# Patient Record
Sex: Female | Born: 1935 | Race: White | Hispanic: No | Smoking: Never smoker
Health system: Southern US, Community
[De-identification: ages and names within clinical notes are randomized; demographics above are authoritative.]

## PROBLEM LIST (undated history)

## (undated) DIAGNOSIS — F0280 Dementia in other diseases classified elsewhere without behavioral disturbance: Secondary | ICD-10-CM

## (undated) DIAGNOSIS — D472 Monoclonal gammopathy: Secondary | ICD-10-CM

## (undated) DIAGNOSIS — I4891 Unspecified atrial fibrillation: Secondary | ICD-10-CM

## (undated) DIAGNOSIS — F0390 Unspecified dementia without behavioral disturbance: Secondary | ICD-10-CM

## (undated) DIAGNOSIS — N390 Urinary tract infection, site not specified: Secondary | ICD-10-CM

## (undated) DIAGNOSIS — S0033XA Contusion of nose, initial encounter: Secondary | ICD-10-CM

## (undated) DIAGNOSIS — S4292XA Fracture of left shoulder girdle, part unspecified, initial encounter for closed fracture: Secondary | ICD-10-CM

## (undated) DIAGNOSIS — B9620 Unspecified Escherichia coli [E. coli] as the cause of diseases classified elsewhere: Secondary | ICD-10-CM

## (undated) DIAGNOSIS — E876 Hypokalemia: Secondary | ICD-10-CM

## (undated) DIAGNOSIS — K219 Gastro-esophageal reflux disease without esophagitis: Secondary | ICD-10-CM

## (undated) DIAGNOSIS — K559 Vascular disorder of intestine, unspecified: Secondary | ICD-10-CM

## (undated) DIAGNOSIS — S22080A Wedge compression fracture of T11-T12 vertebra, initial encounter for closed fracture: Secondary | ICD-10-CM

## (undated) DIAGNOSIS — G309 Alzheimer's disease, unspecified: Secondary | ICD-10-CM

## (undated) HISTORY — DX: Fracture of left shoulder girdle, part unspecified, initial encounter for closed fracture: S42.92XA

## (undated) HISTORY — DX: Monoclonal gammopathy: D47.2

## (undated) HISTORY — DX: Unspecified dementia, unspecified severity, without behavioral disturbance, psychotic disturbance, mood disturbance, and anxiety: F03.90

## (undated) HISTORY — PX: OTHER SURGICAL HISTORY: SHX169

## (undated) HISTORY — DX: Wedge compression fracture of T11-T12 vertebra, initial encounter for closed fracture: S22.080A

## (undated) HISTORY — DX: Contusion of nose, initial encounter: S00.33XA

---

## 2008-04-17 ENCOUNTER — Ambulatory Visit: Payer: Self-pay | Admitting: Diagnostic Radiology

## 2008-04-17 ENCOUNTER — Emergency Department (HOSPITAL_BASED_OUTPATIENT_CLINIC_OR_DEPARTMENT_OTHER): Admission: EM | Admit: 2008-04-17 | Discharge: 2008-04-17 | Payer: Self-pay | Admitting: Emergency Medicine

## 2009-11-30 ENCOUNTER — Emergency Department (HOSPITAL_BASED_OUTPATIENT_CLINIC_OR_DEPARTMENT_OTHER)
Admission: EM | Admit: 2009-11-30 | Discharge: 2009-11-30 | Payer: Self-pay | Source: Home / Self Care | Admitting: Emergency Medicine

## 2010-03-14 LAB — URINALYSIS, ROUTINE W REFLEX MICROSCOPIC
Glucose, UA: NEGATIVE mg/dL
Hgb urine dipstick: NEGATIVE
Specific Gravity, Urine: 1.02 (ref 1.005–1.030)

## 2010-03-14 LAB — BASIC METABOLIC PANEL
Chloride: 106 mEq/L (ref 96–112)
GFR calc non Af Amer: >60 mL/min (ref >60)
Glucose, Bld: 87 mg/dL (ref 70–99)
Potassium: 4 mEq/L (ref 3.5–5.1)
Sodium: 144 mEq/L (ref 135–145)

## 2010-03-14 LAB — DIFFERENTIAL
Basophils Relative: 1 % (ref 0–1)
Eosinophils Relative: 3 % (ref 0–5)
Lymphocytes Relative: 35 % (ref 12–46)
Monocytes Absolute: 0.4 10*3/uL (ref 0.1–1.0)
Monocytes Relative: 9 % (ref 3–12)
Neutro Abs: 2.7 10*3/uL (ref 1.7–7.7)

## 2010-03-14 LAB — CBC
HCT: 35.5 % — ABNORMAL LOW (ref 36.0–46.0)
Hemoglobin: 12.2 g/dL (ref 12.0–15.0)
MCHC: 34.2 g/dL (ref 30.0–36.0)
MCV: 96.8 fL (ref 78.0–100.0)

## 2010-04-12 LAB — DIFFERENTIAL
Basophils Absolute: 0.1 10*3/uL (ref 0.0–0.1)
Basophils Relative: 1 % (ref 0–1)
Eosinophils Absolute: 0.2 10*3/uL (ref 0.0–0.7)
Eosinophils Relative: 3 % (ref 0–5)
Lymphocytes Relative: 33 % (ref 12–46)
Lymphs Abs: 2.2 10*3/uL (ref 0.7–4.0)
Monocytes Absolute: 0.5 10*3/uL (ref 0.1–1.0)
Monocytes Relative: 8 % (ref 3–12)
Neutro Abs: 3.7 10*3/uL (ref 1.7–7.7)
Neutrophils Relative %: 55 % (ref 43–77)

## 2010-04-12 LAB — BASIC METABOLIC PANEL
Chloride: 106 mEq/L (ref 96–112)
Creatinine, Ser: 0.8 mg/dL (ref 0.4–1.2)
GFR calc Af Amer: >60 mL/min (ref >60)
Potassium: 3.7 mEq/L (ref 3.5–5.1)
Sodium: 144 mEq/L (ref 135–145)

## 2010-04-12 LAB — CBC
HCT: 37.1 % (ref 36.0–46.0)
Hemoglobin: 12.2 g/dL (ref 12.0–15.0)
MCHC: 32.8 g/dL (ref 30.0–36.0)
MCV: 90.7 fL (ref 78.0–100.0)
Platelets: 252 10*3/uL (ref 150–400)
RBC: 4.09 MIL/uL (ref 3.87–5.11)
RDW: 12.7 % (ref 11.5–15.5)
WBC: 6.7 10*3/uL (ref 4.0–10.5)

## 2010-04-12 LAB — URINALYSIS, ROUTINE W REFLEX MICROSCOPIC
Glucose, UA: NEGATIVE mg/dL
pH: 6 (ref 5.0–8.0)

## 2010-04-12 LAB — URINE CULTURE

## 2010-06-07 ENCOUNTER — Emergency Department (INDEPENDENT_AMBULATORY_CARE_PROVIDER_SITE_OTHER): Payer: Medicare Other

## 2010-06-07 ENCOUNTER — Emergency Department (HOSPITAL_BASED_OUTPATIENT_CLINIC_OR_DEPARTMENT_OTHER)
Admission: EM | Admit: 2010-06-07 | Discharge: 2010-06-07 | Disposition: A | Discharge status: Other Acute Inpatient Hospital | Payer: Medicare Other | Attending: Emergency Medicine | Admitting: Emergency Medicine

## 2010-06-07 DIAGNOSIS — I517 Cardiomegaly: Secondary | ICD-10-CM

## 2010-06-07 DIAGNOSIS — R404 Transient alteration of awareness: Secondary | ICD-10-CM

## 2010-06-07 DIAGNOSIS — F29 Unspecified psychosis not due to a substance or known physiological condition: Secondary | ICD-10-CM

## 2010-06-07 DIAGNOSIS — Z79899 Other long term (current) drug therapy: Secondary | ICD-10-CM | POA: Insufficient documentation

## 2010-06-07 DIAGNOSIS — Z7982 Long term (current) use of aspirin: Secondary | ICD-10-CM | POA: Insufficient documentation

## 2010-06-07 DIAGNOSIS — G309 Alzheimer's disease, unspecified: Secondary | ICD-10-CM | POA: Insufficient documentation

## 2010-06-07 DIAGNOSIS — N390 Urinary tract infection, site not specified: Secondary | ICD-10-CM | POA: Insufficient documentation

## 2010-06-07 DIAGNOSIS — K219 Gastro-esophageal reflux disease without esophagitis: Secondary | ICD-10-CM | POA: Insufficient documentation

## 2010-06-07 DIAGNOSIS — F0280 Dementia in other diseases classified elsewhere without behavioral disturbance: Secondary | ICD-10-CM | POA: Insufficient documentation

## 2010-06-07 DIAGNOSIS — M852 Hyperostosis of skull: Secondary | ICD-10-CM

## 2010-06-07 LAB — URINALYSIS, ROUTINE W REFLEX MICROSCOPIC
Ketones, ur: 15 mg/dL — AB
Nitrite: POSITIVE — AB
Protein, ur: NEGATIVE mg/dL
Urobilinogen, UA: 2 mg/dL — ABNORMAL HIGH (ref 0.0–1.0)
pH: 7.5 (ref 5.0–8.0)

## 2010-06-07 LAB — DIFFERENTIAL
Basophils Absolute: 0 10*3/uL (ref 0.0–0.1)
Basophils Relative: 0 % (ref 0–1)
Lymphs Abs: 1.2 10*3/uL (ref 0.7–4.0)
Monocytes Relative: 7 % (ref 3–12)

## 2010-06-07 LAB — CBC
HCT: 38.5 % (ref 36.0–46.0)
Hemoglobin: 13 g/dL (ref 12.0–15.0)
RBC: 4.09 MIL/uL (ref 3.87–5.11)
WBC: 9.1 10*3/uL (ref 4.0–10.5)

## 2010-06-07 LAB — BASIC METABOLIC PANEL
CO2: 27 mEq/L (ref 19–32)
Calcium: 9.8 mg/dL (ref 8.4–10.5)
Chloride: 101 mEq/L (ref 96–112)
GFR calc Af Amer: >60 mL/min (ref >60)
Potassium: 3.9 mEq/L (ref 3.5–5.1)
Sodium: 139 mEq/L (ref 135–145)

## 2010-06-07 LAB — URINE MICROSCOPIC-ADD ON

## 2010-06-10 LAB — URINE CULTURE
Colony Count: >=100000
Culture  Setup Time: 201206062129

## 2010-06-26 ENCOUNTER — Emergency Department (INDEPENDENT_AMBULATORY_CARE_PROVIDER_SITE_OTHER): Payer: Medicare Other

## 2010-06-26 ENCOUNTER — Emergency Department (HOSPITAL_BASED_OUTPATIENT_CLINIC_OR_DEPARTMENT_OTHER)
Admission: EM | Admit: 2010-06-26 | Discharge: 2010-06-26 | Disposition: A | Discharge status: Home / Self Care | Payer: Medicare Other | Attending: Emergency Medicine | Admitting: Emergency Medicine

## 2010-06-26 DIAGNOSIS — Z79899 Other long term (current) drug therapy: Secondary | ICD-10-CM | POA: Insufficient documentation

## 2010-06-26 DIAGNOSIS — W19XXXA Unspecified fall, initial encounter: Secondary | ICD-10-CM

## 2010-06-26 DIAGNOSIS — N390 Urinary tract infection, site not specified: Secondary | ICD-10-CM | POA: Insufficient documentation

## 2010-06-26 DIAGNOSIS — K219 Gastro-esophageal reflux disease without esophagitis: Secondary | ICD-10-CM | POA: Insufficient documentation

## 2010-06-26 DIAGNOSIS — Z0389 Encounter for observation for other suspected diseases and conditions ruled out: Secondary | ICD-10-CM | POA: Insufficient documentation

## 2010-06-26 DIAGNOSIS — Y9210 Unspecified residential institution as the place of occurrence of the external cause: Secondary | ICD-10-CM | POA: Insufficient documentation

## 2010-06-26 DIAGNOSIS — G319 Degenerative disease of nervous system, unspecified: Secondary | ICD-10-CM | POA: Insufficient documentation

## 2010-06-26 DIAGNOSIS — W050XXA Fall from non-moving wheelchair, initial encounter: Secondary | ICD-10-CM | POA: Insufficient documentation

## 2010-06-26 DIAGNOSIS — G309 Alzheimer's disease, unspecified: Secondary | ICD-10-CM | POA: Insufficient documentation

## 2010-06-26 DIAGNOSIS — F0280 Dementia in other diseases classified elsewhere without behavioral disturbance: Secondary | ICD-10-CM | POA: Insufficient documentation

## 2010-06-26 DIAGNOSIS — I6789 Other cerebrovascular disease: Secondary | ICD-10-CM

## 2010-06-26 LAB — BASIC METABOLIC PANEL
GFR calc non Af Amer: >60 mL/min (ref >60)
Glucose, Bld: 93 mg/dL (ref 70–99)
Potassium: 3.9 mEq/L (ref 3.5–5.1)
Sodium: 139 mEq/L (ref 135–145)

## 2010-06-26 LAB — DIFFERENTIAL
Basophils Relative: 0 % (ref 0–1)
Eosinophils Relative: 1 % (ref 0–5)
Monocytes Absolute: 0.6 10*3/uL (ref 0.1–1.0)
Monocytes Relative: 9 % (ref 3–12)
Neutro Abs: 4.6 10*3/uL (ref 1.7–7.7)

## 2010-06-26 LAB — CBC
HCT: 35.4 % — ABNORMAL LOW (ref 36.0–46.0)
Hemoglobin: 11.8 g/dL — ABNORMAL LOW (ref 12.0–15.0)
MCH: 31.5 pg (ref 26.0–34.0)
MCHC: 33.3 g/dL (ref 30.0–36.0)

## 2010-06-26 LAB — URINALYSIS, ROUTINE W REFLEX MICROSCOPIC
Glucose, UA: NEGATIVE mg/dL
Ketones, ur: NEGATIVE mg/dL
Protein, ur: NEGATIVE mg/dL

## 2010-06-26 LAB — URINE MICROSCOPIC-ADD ON

## 2010-06-28 LAB — URINE CULTURE: Culture  Setup Time: 201206260141

## 2010-11-09 ENCOUNTER — Encounter: Payer: Self-pay | Admitting: Emergency Medicine

## 2010-11-09 ENCOUNTER — Other Ambulatory Visit: Payer: Self-pay

## 2010-11-09 ENCOUNTER — Inpatient Hospital Stay (HOSPITAL_BASED_OUTPATIENT_CLINIC_OR_DEPARTMENT_OTHER)
Admission: EM | Admit: 2010-11-09 | Discharge: 2010-11-14 | DRG: 394 | Disposition: A | Discharge status: Nursing Home (Includes ICF) | Payer: Medicare Other | Attending: Internal Medicine | Admitting: Internal Medicine

## 2010-11-09 DIAGNOSIS — E876 Hypokalemia: Secondary | ICD-10-CM | POA: Diagnosis present

## 2010-11-09 DIAGNOSIS — K922 Gastrointestinal hemorrhage, unspecified: Secondary | ICD-10-CM | POA: Diagnosis present

## 2010-11-09 DIAGNOSIS — Z781 Physical restraint status: Secondary | ICD-10-CM | POA: Diagnosis not present

## 2010-11-09 DIAGNOSIS — F0390 Unspecified dementia without behavioral disturbance: Secondary | ICD-10-CM | POA: Diagnosis present

## 2010-11-09 DIAGNOSIS — K559 Vascular disorder of intestine, unspecified: Principal | ICD-10-CM | POA: Diagnosis present

## 2010-11-09 DIAGNOSIS — K219 Gastro-esophageal reflux disease without esophagitis: Secondary | ICD-10-CM | POA: Insufficient documentation

## 2010-11-09 DIAGNOSIS — A498 Other bacterial infections of unspecified site: Secondary | ICD-10-CM | POA: Diagnosis present

## 2010-11-09 DIAGNOSIS — B9620 Unspecified Escherichia coli [E. coli] as the cause of diseases classified elsewhere: Secondary | ICD-10-CM | POA: Diagnosis present

## 2010-11-09 DIAGNOSIS — I4891 Unspecified atrial fibrillation: Secondary | ICD-10-CM | POA: Diagnosis present

## 2010-11-09 DIAGNOSIS — N390 Urinary tract infection, site not specified: Secondary | ICD-10-CM | POA: Diagnosis present

## 2010-11-09 HISTORY — DX: Hypokalemia: E87.6

## 2010-11-09 HISTORY — DX: Unspecified atrial fibrillation: I48.91

## 2010-11-09 HISTORY — DX: Dementia in other diseases classified elsewhere, unspecified severity, without behavioral disturbance, psychotic disturbance, mood disturbance, and anxiety: F02.80

## 2010-11-09 HISTORY — DX: Urinary tract infection, site not specified: B96.20

## 2010-11-09 HISTORY — DX: Unspecified Escherichia coli (E. coli) as the cause of diseases classified elsewhere: N39.0

## 2010-11-09 HISTORY — DX: Alzheimer's disease, unspecified: G30.9

## 2010-11-09 HISTORY — DX: Gastro-esophageal reflux disease without esophagitis: K21.9

## 2010-11-09 LAB — SURGICAL PCR SCREEN
MRSA, PCR: NEGATIVE
Staphylococcus aureus: NEGATIVE

## 2010-11-09 LAB — DIFFERENTIAL
Basophils Absolute: 0 10*3/uL (ref 0.0–0.1)
Basophils Relative: 0 % (ref 0–1)
Eosinophils Absolute: 0 10*3/uL (ref 0.0–0.7)
Eosinophils Relative: 0 % (ref 0–5)
Monocytes Absolute: 0.5 10*3/uL (ref 0.1–1.0)
Neutro Abs: 9.7 10*3/uL — ABNORMAL HIGH (ref 1.7–7.7)

## 2010-11-09 LAB — CBC
HCT: 41.9 % (ref 36.0–46.0)
MCH: 31.2 pg (ref 26.0–34.0)
MCHC: 33.2 g/dL (ref 30.0–36.0)
MCV: 93.9 fL (ref 78.0–100.0)
RDW: 12.6 % (ref 11.5–15.5)

## 2010-11-09 LAB — COMPREHENSIVE METABOLIC PANEL
AST: 33 U/L (ref 0–37)
Albumin: 3.9 g/dL (ref 3.5–5.2)
Calcium: 10.3 mg/dL (ref 8.4–10.5)
Creatinine, Ser: 0.7 mg/dL (ref 0.50–1.10)
Total Protein: 8.1 g/dL (ref 6.0–8.3)

## 2010-11-09 LAB — LACTIC ACID, PLASMA: Lactic Acid, Venous: 1.3 mmol/L (ref 0.5–2.2)

## 2010-11-09 MED ORDER — SODIUM CHLORIDE 0.9 % IV BOLUS (SEPSIS)
1000.0000 mL | Freq: Once | INTRAVENOUS | Status: AC
  Administered 2010-11-09: 1000 mL via INTRAVENOUS

## 2010-11-09 MED ORDER — DIPHENHYDRAMINE HCL 50 MG/ML IJ SOLN: 25.0000 mg | Freq: Once | INTRAMUSCULAR | Status: DC

## 2010-11-09 MED ORDER — POTASSIUM CHLORIDE IN NACL 20-0.9 MEQ/L-% IV SOLN
INTRAVENOUS | Status: DC
  Administered 2010-11-09 – 2010-11-10 (×2): via INTRAVENOUS
  Administered 2010-11-11: 75 mL/h via INTRAVENOUS
  Filled 2010-11-09 (×3): qty 1000

## 2010-11-09 MED ORDER — PANTOPRAZOLE SODIUM 40 MG IV SOLR
40.0000 mg | Freq: Two times a day (BID) | INTRAVENOUS | Status: DC
  Administered 2010-11-10 – 2010-11-11 (×2): 40 mg via INTRAVENOUS
  Filled 2010-11-09 (×4): qty 40

## 2010-11-09 MED ORDER — DILTIAZEM LOAD VIA INFUSION
10.0000 mg | Freq: Once | INTRAVENOUS | Status: AC
  Administered 2010-11-09: 10 mg via INTRAVENOUS
  Filled 2010-11-09: qty 10

## 2010-11-09 MED ORDER — SODIUM CHLORIDE 0.9 % IV SOLN
8.0000 mg/h | INTRAVENOUS | Status: DC
  Administered 2010-11-09: 8 mg/h via INTRAVENOUS
  Filled 2010-11-09 (×3): qty 80

## 2010-11-09 MED ORDER — DILTIAZEM HCL 100 MG IV SOLR
5.0000 mg/h | INTRAVENOUS | Status: DC
  Administered 2010-11-09: 5 mg/h via INTRAVENOUS
  Administered 2010-11-10 – 2010-11-11 (×5): 10 mg/h via INTRAVENOUS
  Filled 2010-11-09 (×2): qty 100

## 2010-11-09 MED ORDER — ACETAMINOPHEN 325 MG PO TABS: 650.0000 mg | ORAL_TABLET | Freq: Once | ORAL | Status: DC

## 2010-11-09 MED ORDER — PANTOPRAZOLE SODIUM 40 MG IV SOLR
80.0000 mg | Freq: Once | INTRAVENOUS | Status: AC
  Administered 2010-11-09: 80 mg via INTRAVENOUS
  Filled 2010-11-09: qty 80

## 2010-11-09 NOTE — ED Notes (Signed)
[facility] here for pt transport to [facility]. IV site unremarkable, IV gtt infusing per MD orders. Pt remains A-fib on monitor, resps even and unlabored, NAD noted. DNR form given to [facility] to transport with pt.

## 2010-11-09 NOTE — ED Notes (Signed)
Pt report given to Donnamae Jude, Charity fundraiser at [employer]

## 2010-11-09 NOTE — ED Notes (Signed)
Pt cleaned of moderate amount of soft stool with blood noted, peri-care given and clean brief applied. Pt tolerated well. IV fluids infusing without diff, IV site unremarkable. Pt remains A-fib on monitor, SR up x2.

## 2010-11-09 NOTE — ED Provider Notes (Signed)
History     CSN: [id number] Arrival date & time: 11/09/2010  5:07 PM   First MD Initiated Contact with Patient 11/09/10 1715      Chief Complaint  Patient presents with  . Rectal Bleeding    (Consider location/radiation/quality/duration/timing/severity/associated sxs/prior treatment) HPI The patient presents from her nursing home with concerns of bloody stool. The patient has dementia, and only acknowledges blood in her stool. She is incapable of providing duration of symptoms, or any alleviating or exacerbating circumstances. She notes that she is in no pain, and has no other complaints. Per report the patient has had blood in her stool today, no reports of syncope, or other notable changes in behavior Past Medical History  Diagnosis Date  . Alzheimer disease   . Atrial fibrillation   . Gastro - esophageal reflux disease     No past surgical history on file.  No family history on file.  History  Substance Use Topics  . Smoking status: Not on file  . Smokeless tobacco: Not on file  . Alcohol Use: No    OB History    Grav Para Term Preterm Abortions TAB SAB Ect Mult Living                  Review of Systems  Unable to perform ROS: Dementia    Allergies  Review of patient's allergies indicates not on file.  Home Medications  No current outpatient prescriptions on file.  BP 136/87  Pulse 129  Temp(Src) 98.8 F (37.1 C) (Oral)  Resp 20  SpO2 97%  Physical Exam  Constitutional: No distress.       Elderly, appropriate for age  HENT:  Head: Normocephalic and atraumatic.  Eyes: EOM are normal. Pupils are equal, round, and reactive to light.  Neck: Neck supple.  Cardiovascular: Tachycardia present.   Pulmonary/Chest: Breath sounds normal. No accessory muscle usage. No respiratory distress.  Abdominal: Soft. Normal appearance. There is generalized tenderness.  Genitourinary:       Melena about the patient's rectum  Musculoskeletal: She exhibits no edema.    Neurological: No cranial nerve deficit.  Skin: Skin is warm and dry. She is not diaphoretic.  Psychiatric: Her affect is blunt. She is slowed and withdrawn. Cognition and memory are impaired.    ED Course  Procedures (including critical care time)   Labs Reviewed  CBC  DIFFERENTIAL  COMPREHENSIVE METABOLIC PANEL  LACTIC ACID, PLASMA   No results found.   No diagnosis found.    MDM  This elderly female presents from her nursing home with concerns of rectal bleeding. On exam the patient is in no distress though with her history of dementia she is incapable of providing useful information. The patient is tachycardic with atrial flutter, she has a history of dysrhythmia. The patient has gross melena. Hemoglobin is greater than 13 which is somewhat reassuring, but with her GI bleed, the patient will be admitted for further evaluation. The patient's case was discussed with Dr. Elnoria Howard, Sandria Manly, as well as a hospitalist, Dr. Tarri Abernethy, MD 11/09/10 [phone]

## 2010-11-09 NOTE — H&P (Signed)
PCP:   No primary provider on file.   Chief Complaint:  GI bleed  HPI: This is a demented female resident of a nursing home, who was transferred here from [facility] with GI bleed. At the NH she was noted to have GI bleed and was sent to the ER. She had frank melena in the [place] ER, here she had a stool, no obvious blood. Dr Elnoria Howard was contacted by the physician at [facility] and agreed to see patient. Patient's hemoglobin is 13. She has A fib, now with RVR. She is not on anti-coagulation. She is on a baby ASA. I am unable to obtain any history from the patient, she has severe dementia.  Review of Systems:  Unable to obtain d/t patients demetia  Past Medical History: Past Medical History  Diagnosis Date  . Alzheimer disease   . Atrial fibrillation   . Gastro - esophageal reflux disease    Past Surgical History  Procedure Date  . Unknown     Medications: Prior to Admission medications   Medication Sig Start Date End Date Taking? Authorizing Provider  aspirin 81 MG tablet Take 81 mg by mouth daily.     Yes Historical Provider, MD  atenolol (TENORMIN) 25 MG tablet Take 25 mg by mouth daily.     Yes Historical Provider, MD  Calcium Carbonate-Vitamin D (CALCIUM 600+D) 600-400 MG-UNIT per tablet Take 1 tablet by mouth daily.     Yes Historical Provider, MD  cholecalciferol (VITAMIN D) 1000 UNITS tablet Take 1,000 Units by mouth daily.     Yes Historical Provider, MD  diltiazem (CARDIZEM) 120 MG tablet Take 120 mg by mouth daily.     Yes Historical Provider, MD  feeding supplement (ENSURE IMMUNE HEALTH) LIQD Take 237 mLs by mouth daily.     Yes Historical Provider, MD  ferrous sulfate 220 (44 FE) MG/5ML solution Take 330 mg by mouth daily. At noon     Yes Historical Provider, MD  memantine (NAMENDA) 5 MG tablet Take 5 mg by mouth 2 (two) times daily.     Yes Historical Provider, MD  Nutritional Supplements (UTYMAX) PACK Take 1 packet by mouth daily.     Yes Historical Provider, MD    omeprazole (PRILOSEC) 20 MG capsule Take 20 mg by mouth daily. Do not crush or chew    Yes Historical Provider, MD  Skin Protectants, Misc. (BAZA PROTECT EX) Apply 1 application topically 2 (two) times daily.     Yes Historical Provider, MD  acetaminophen (TYLENOL) 325 MG tablet Take 650 mg by mouth every 6 (six) hours as needed. For pain     Historical Provider, MD    Allergies:  No Known Allergies  Social History:  does not have a smoking history on file. She does not have any smokeless tobacco history on file. She reports that she does not drink alcohol. Her drug history not on file. resides in a NH  Family History: Unable to obtain d/t [patients dementia  Physical Exam: Filed Vitals:   11/09/10 1916 11/09/10 2031 11/09/10 2200 11/09/10 2323  BP: 167/82 153/83 146/78 151/88  Pulse:   130 130  Temp:  99.3 F (37.4 C)    TempSrc:      Resp: 16 20 16 16   SpO2: 99%  100% 100%    General:  Demented female Eyes: PERRLA, pink conjunctiva, no scleral icterus ENT: Moist oral mucosa, neck supple, no thyromegaly Lungs: clear to ascultation, no wheeze, no crackles, no use of  accessory muscles Cardiovascular: regular rate and rhythm, no regurgitation, no gallops, no murmurs. No carotid bruits, no JVD Abdomen: soft, positive BS, non-tender, non-distended, no organomegaly, not an acute abdomen GU: not examined Neuro: unable to assess d/t dementia Musculoskeletal: strength unable to assess d/t dementia, no clubbing, cyanosis or edema Skin: no rash, no subcutaneous crepitation, no decubitus Psych: demented female   Labs on Admission:   [facility] 11/09/10 1731  NA 140  K 4.3  CL 101  CO2 26  GLUCOSE 138*  BUN 21  CREATININE 0.70  CALCIUM 10.3  MG --  PHOS --    Basename 11/09/10 1731  AST 33  ALT 22  ALKPHOS 89  BILITOT 0.7  PROT 8.1  ALBUMIN 3.9   No results found for this basename: LIPASE:2,AMYLASE:2 in the last 72 hours  Basename 11/09/10 1731  WBC 10.5   NEUTROABS 9.7*  HGB 13.9  HCT 41.9  MCV 93.9  PLT 218   No results found for this basename: CKTOTAL:3,CKMB:3,CKMBINDEX:3,TROPONINI:3 in the last 72 hours No results found for this basename: TSH,T4TOTAL,FREET3,T3FREE,THYROIDAB in the last 72 hours No results found for this basename: VITAMINB12:2,FOLATE:2,FERRITIN:2,TIBC:2,IRON:2,RETICCTPCT:2 in the last 72 hours  Radiological Exams on Admission: No results found.  Assessment/Plan Present on Admission:  .GI bleed -admit -serial h/h -transfusion orders for hemoglobin <8 -guiac stools -protonix IV BiD -NPO, IVF hydration -d/c ASA -call Dr Elnoria Howard in AM, he is aware of patient Atrial fibrillation with RVR -cardizem gtt Dementia -stable  Code status: DNR SCD's/GI prophylaxis Dr Derry Lory, Hyun Marsalis 11/09/2010, 11:35 PM

## 2010-11-09 NOTE — ED Notes (Signed)
Pt report given to JC, RN with [employer]

## 2010-11-09 NOTE — ED Notes (Signed)
Staff at nursing home reports seeing sm amt of blood in stool earlier today

## 2010-11-10 ENCOUNTER — Other Ambulatory Visit: Payer: Self-pay | Admitting: Gastroenterology

## 2010-11-10 ENCOUNTER — Encounter (HOSPITAL_COMMUNITY)
Admission: EM | Disposition: A | Discharge status: Nursing Home (Includes ICF) | Payer: Self-pay | Source: Home / Self Care | Attending: Internal Medicine

## 2010-11-10 ENCOUNTER — Encounter (HOSPITAL_COMMUNITY): Payer: Self-pay | Admitting: Gastroenterology

## 2010-11-10 ENCOUNTER — Other Ambulatory Visit: Payer: Self-pay | Admitting: Internal Medicine

## 2010-11-10 DIAGNOSIS — K922 Gastrointestinal hemorrhage, unspecified: Secondary | ICD-10-CM

## 2010-11-10 HISTORY — PX: FLEXIBLE SIGMOIDOSCOPY: SHX5431

## 2010-11-10 HISTORY — PX: ESOPHAGOGASTRODUODENOSCOPY: SHX5428

## 2010-11-10 LAB — TSH: TSH: 0.913 u[IU]/mL (ref 0.350–4.500)

## 2010-11-10 LAB — URINE MICROSCOPIC-ADD ON

## 2010-11-10 LAB — URINALYSIS, ROUTINE W REFLEX MICROSCOPIC
Leukocytes, UA: NEGATIVE
Nitrite: POSITIVE — AB
Protein, ur: NEGATIVE mg/dL
Urobilinogen, UA: 1 mg/dL (ref 0.0–1.0)
pH: 7 (ref 5.0–8.0)

## 2010-11-10 LAB — PREPARE RBC (CROSSMATCH)

## 2010-11-10 LAB — BASIC METABOLIC PANEL
BUN: 19 mg/dL (ref 6–23)
Calcium: 8.7 mg/dL (ref 8.4–10.5)
Creatinine, Ser: 0.74 mg/dL (ref 0.50–1.10)
GFR calc Af Amer: 87 mL/min — ABNORMAL LOW (ref >90)
GFR calc non Af Amer: 75 mL/min — ABNORMAL LOW (ref >90)

## 2010-11-10 LAB — OCCULT BLOOD X 1 CARD TO LAB, STOOL: Fecal Occult Bld: POSITIVE

## 2010-11-10 LAB — HEMOGLOBIN AND HEMATOCRIT, BLOOD
HCT: 36.2 % (ref 36.0–46.0)
Hemoglobin: 12 g/dL (ref 12.0–15.0)

## 2010-11-10 LAB — PROTIME-INR: INR: 1.03 (ref 0.00–1.49)

## 2010-11-10 SURGERY — EGD (ESOPHAGOGASTRODUODENOSCOPY)
Anesthesia: Moderate Sedation | Site: Rectum

## 2010-11-10 SURGERY — EGD (ESOPHAGOGASTRODUODENOSCOPY)
Anesthesia: Moderate Sedation

## 2010-11-10 MED ORDER — METOPROLOL TARTRATE 1 MG/ML IV SOLN
INTRAVENOUS | Status: AC
  Filled 2010-11-10: qty 5

## 2010-11-10 MED ORDER — METOPROLOL TARTRATE 1 MG/ML IV SOLN
INTRAVENOUS | Status: AC
  Administered 2010-11-10: 5 mg
  Filled 2010-11-10: qty 5

## 2010-11-10 MED ORDER — MIDAZOLAM HCL 10 MG/2ML IJ SOLN
INTRAMUSCULAR | Status: AC
  Filled 2010-11-10: qty 2

## 2010-11-10 MED ORDER — SODIUM CHLORIDE 0.9 % IV SOLN
Freq: Once | INTRAVENOUS | Status: AC
  Administered 2010-11-10: 10:00:00 via INTRAVENOUS

## 2010-11-10 MED ORDER — FENTANYL NICU IV SYRINGE 50 MCG/ML
INJECTION | INTRAMUSCULAR | Status: DC | PRN
  Administered 2010-11-10: 20 ug via INTRAVENOUS

## 2010-11-10 MED ORDER — FENTANYL CITRATE 0.05 MG/ML IJ SOLN
INTRAMUSCULAR | Status: AC
  Filled 2010-11-10: qty 2

## 2010-11-10 MED ORDER — SODIUM CHLORIDE 0.9 % IV BOLUS (SEPSIS): 1000.0000 mL | Freq: Once | INTRAVENOUS | Status: DC

## 2010-11-10 MED ORDER — POTASSIUM CHLORIDE IN NACL 20-0.9 MEQ/L-% IV SOLN
INTRAVENOUS | Status: AC
  Filled 2010-11-10: qty 1000

## 2010-11-10 MED ORDER — METOPROLOL TARTRATE 1 MG/ML IV SOLN
5.0000 mg | Freq: Four times a day (QID) | INTRAVENOUS | Status: DC
  Administered 2010-11-10 – 2010-11-11 (×3): 5 mg via INTRAVENOUS
  Filled 2010-11-10 (×5): qty 5

## 2010-11-10 MED ORDER — MIDAZOLAM HCL 10 MG/2ML IJ SOLN
INTRAMUSCULAR | Status: DC | PRN
  Administered 2010-11-10: 1 mg via INTRAVENOUS

## 2010-11-10 NOTE — Progress Notes (Signed)
Subjective: Demented, confused, does not appear to be in acute distress at this time. Needing restraints this am, as attempting to climb out of bed.  Objective: Vital signs in last 24 hours: Temp:  [98.6 F (37 C)-99.5 F (37.5 C)] 99.3 F (37.4 C) (11/09 0000) Pulse Rate:  [66-132] 70  (11/09 0630) Resp:  [3-30] 16  (11/09 0630) BP: (105-167)/(41-99) 124/45 mmHg (11/09 0630) SpO2:  [97 %-100 %] 100 % (11/09 0630) Weight:  [52.3 kg (115 lb 4.8 oz)] 115 lb 4.8 oz (52.3 kg) (11/08 2200) Weight change:  Last BM Date: 11/10/10  Intake/Output from previous day: 11/08 0701 - 11/09 0700 In: 625 [I.V.:625] Out: 230 [Urine:227; Stool:3]     Physical Exam:  General: Comfortable, alert, communicative, confused, not short of breath at rest.  HEENT:  No clinical pallor, no jaundice, no conjunctival injection, hydration appears fair. NECK:  Supple, JVP not seen, no carotid bruits, no palpable lymphadenopathy, no palpable goiter. CHEST:  Clinically clear to auscultation, no wheezes, no crackles. HEART:  Sounds 1 and 2 heard, normal, irregular, tachycardic, no murmurs. ABDOMEN:  Flat, soft, non-tender, no palpable organomegaly, no palpable masses, normal bowel sounds. LOWER EXTREMITIES:  No pitting edema, palpable peripheral pulses. MUSCULOSKELETAL SYSTEM:  Generalized osteoarthritic changes, otherwise, normal. CENTRAL NERVOUS SYSTEM:  No focal neurologic deficit on gross examination.  Lab Results:  Basename 11/10/10 0320 11/10/10 0035 11/09/10 1731  WBC -- -- 10.5  HGB 12.0 12.9 --  HCT 36.2 39.0 --  PLT -- -- 218    Basename 11/10/10 0320 11/09/10 1731  NA 135 140  K 3.7 4.3  CL 102 101  CO2 24 26  GLUCOSE 151* 138*  BUN 19 21  CREATININE 0.74 0.70  CALCIUM 8.7 10.3   Recent Results (from the past 240 hour(s))  SURGICAL PCR SCREEN     Status: Normal   Collection Time   11/09/10 10:01 PM      Component Value Range Status Comment   MRSA, PCR NEGATIVE  NEGATIVE  Final    Staphylococcus aureus NEGATIVE  NEGATIVE  Final      Studies/Results: No results found.  Medications: Scheduled Meds:   . acetaminophen  650 mg Oral Once  . diltiazem  10 mg Intravenous Once  . diphenhydrAMINE  25 mg Intravenous Once  . pantoprazole (PROTONIX) IV  80 mg Intravenous Once  . pantoprazole (PROTONIX) IV  40 mg Intravenous Q12H  . sodium chloride  1,000 mL Intravenous Once  . DISCONTD: sodium chloride  1,000 mL Intravenous Once   Continuous Infusions:   . 0.9 % NaCl with KCl 20 mEq / L 75 mL/hr at 11/10/10 0600  . diltiazem (CARDIZEM) infusion 5 mg/hr (11/10/10 0600)  . DISCONTD: pantoprozole (PROTONIX) infusion 8 mg/hr (11/09/10 2200)   PRN Meds:.  Assessment/Plan:  Principal Problem:  *Atrial fibrillation with RVR: Still in fast atrial fibrillation, with HR between 70-110, on ivi Cardizem. Will add beta-Blocker to treatment. For completeness, will cycle cardiac enzymes and check TSH. Active Problems:  1. GI bleed: Hemodynamically stable, HB is stable and reasonable. On PPI, ivi fluids, pending GI consult.  2. Dementia: patient is confused and attempting to gert out of bed. Will apply restraints judiciously. Baseline mental status is unclear at this time. Will check U/A, to rule out UTI.    LOS: 1 day   [place] Zou,CHRISTOPHER 11/10/2010, 9:17 AM

## 2010-11-10 NOTE — Progress Notes (Signed)
Order to transfer pt received, report call to [place] RN on [facility], pt alert and stable. Attempt was made to notify her son, number we have on chart for him was ringing busy. Tanyah Debruyne, Lilia Pro

## 2010-11-10 NOTE — Consult Note (Signed)
  Stacey Huang is an 77 y.o. female.   Chief Complaint: Melena HPI: This is an 77 year old female with a PMH of Alzehimer's disease, Afib, and GERD who is admitted to the hospital with complaints of rectal bleeding.  The patient has significant dementia and obtaining a history of difficult.  The history is obtained from Dr. Priscille Loveless note.  During his evaluation she was noted to have gross evidence of melena.  Her HGB on admission was in the 13 range and it currently is in the 12 range.  Equivocal report of abdominal pain.  Past Medical History  Diagnosis Date  . Alzheimer disease   . Atrial fibrillation   . Gastro - esophageal reflux disease     Past Surgical History  Procedure Date  . Unknown     No family history on file. Social History:  does not have a smoking history on file. She does not have any smokeless tobacco history on file. She reports that she does not drink alcohol. Her drug history not on file.  Allergies: No Known Allergies  Medications Prior to Admission  Medication Dose Route Frequency Provider Last Rate Last Dose  . 0.9 % NaCl with KCl 20 mEq/ L  infusion   Intravenous Continuous Debby Crosley, MD 75 mL/hr at 11/10/10 0600    . acetaminophen (TYLENOL) tablet 650 mg  650 mg Oral Once Debby Crosley, MD      . diltiazem (CARDIZEM) 1 mg/mL load via infusion 10 mg  10 mg Intravenous Once Debby Crosley, MD   10 mg at 11/09/10 2345   And  . diltiazem (CARDIZEM) 100 mg in dextrose 5 % 100 mL infusion  5-15 mg/hr Intravenous Continuous Debby Crosley, MD 5 mL/hr at 11/10/10 0600 5 mg/hr at 11/10/10 0600  . diphenhydrAMINE (BENADRYL) injection 25 mg  25 mg Intravenous Once Debby Crosley, MD      . pantoprazole (PROTONIX) 80 mg in sodium chloride 0.9 % 100 mL IVPB  80 mg Intravenous Once Gerhard Munch, MD   80 mg at 11/09/10 1745  . pantoprazole (PROTONIX) injection 40 mg  40 mg Intravenous Q12H Debby Crosley, MD      . sodium chloride 0.9 % bolus 1,000 mL  1,000 mL  Intravenous Once Gerhard Munch, MD   1,000 mL at 11/09/10 1753  . DISCONTD: pantoprazole (PROTONIX) 80 mg in sodium chloride 0.9 % 250 mL infusion  8 mg/hr Intravenous Continuous Gerhard Munch, MD 25 mL/hr at 11/09/10 2200 8 mg/hr at 11/09/10 2200  . DISCONTD: sodium chloride 0.9 % bolus 1,000 mL  1,000 mL Intravenous Once Gery Pray, MD       No current outpatient prescriptions on file as of 11/10/2010.     No results found.  ROS:  As stated above in the HPI, otherwise negative.  PE: Gen: NAD Lungs: CTA B CV: Irregular, Irregular ABM: Flat, soft, NTND Ext: No C/C/E Blood pressure 124/45, pulse 70, temperature 99.3 F (37.4 C), temperature source Oral, resp. rate 16, height 5\' 3"  (1.6 m), weight 52.3 kg (115 lb 4.8 oz), SpO2 100.00%.   Assessment/Plan 1) Melena 2) Dementia 3) History of GERD  With the findings of gross melena I will perform an EGD today.  Further recommendations will be made pending the findings.  Plan: 1) Follow HGB and transfuse as necessary. 2) Continue with Protonix. 3) On call for EGD.  [name] D 11/10/2010, 7:47 AM

## 2010-11-10 NOTE — Progress Notes (Signed)
CSW completed psychosocial assessment with pts son and daughter-im-law (pls see in shadow chart). Pt was asleep and did not arouse. Pts son reports pt has been a resident of [facility] on the memory unit for approximately 4 years and the plan is for her to return there unless their is a reason she would need another level of care. Pts son was pleasant, however, he expressed concern that no staff member has been able to tell him why his mother was admitted to the hospital or what is going on with her care at this moment. The pts nurse was present in the room and she answered some questions for the family and also informed them that she would make the physician aware that the family had questions. CSW called [facility] to confirm they would be able to take the pt back at discharge and staff member Bonita Quin in admissions, reports they will. CSW will continue to follow and offer support.  Patrice Paradise, LCSWA 11/10/2010 2:38 PM [phone]

## 2010-11-11 ENCOUNTER — Encounter (HOSPITAL_COMMUNITY): Payer: Self-pay | Admitting: *Deleted

## 2010-11-11 DIAGNOSIS — K922 Gastrointestinal hemorrhage, unspecified: Secondary | ICD-10-CM

## 2010-11-11 DIAGNOSIS — R933 Abnormal findings on diagnostic imaging of other parts of digestive tract: Secondary | ICD-10-CM

## 2010-11-11 LAB — BASIC METABOLIC PANEL
Calcium: 9 mg/dL (ref 8.4–10.5)
Creatinine, Ser: 0.69 mg/dL (ref 0.50–1.10)
GFR calc Af Amer: 89 mL/min — ABNORMAL LOW (ref >90)
GFR calc non Af Amer: 77 mL/min — ABNORMAL LOW (ref >90)
Sodium: 132 mEq/L — ABNORMAL LOW (ref 135–145)

## 2010-11-11 LAB — HEMOGLOBIN AND HEMATOCRIT, BLOOD
HCT: 37.6 % (ref 36.0–46.0)
HCT: 35.9 % — ABNORMAL LOW (ref 36.0–46.0)
Hemoglobin: 12.1 g/dL (ref 12.0–15.0)

## 2010-11-11 MED ORDER — MORPHINE SULFATE 0.5 MG/ML IJ SOLN: 2.0000 mg | INTRAMUSCULAR | Status: DC | PRN

## 2010-11-11 MED ORDER — PANTOPRAZOLE SODIUM 40 MG PO TBEC
40.0000 mg | DELAYED_RELEASE_TABLET | Freq: Every day | ORAL | Status: DC
  Administered 2010-11-12: 40 mg via ORAL
  Filled 2010-11-11 (×3): qty 1

## 2010-11-11 MED ORDER — METOPROLOL TARTRATE 25 MG/10 ML ORAL SUSPENSION
25.0000 mg | Freq: Two times a day (BID) | ORAL | Status: DC
  Administered 2010-11-11 – 2010-11-14 (×6): 25 mg via ORAL
  Filled 2010-11-11 (×8): qty 10

## 2010-11-11 MED ORDER — SODIUM CHLORIDE 0.9 % IJ SOLN
10.0000 mL | Freq: Two times a day (BID) | INTRAMUSCULAR | Status: DC
  Administered 2010-11-11 – 2010-11-14 (×7): 10 mL via INTRAVENOUS

## 2010-11-11 MED ORDER — CEFTRIAXONE SODIUM 1 G IJ SOLR
1.0000 g | INTRAMUSCULAR | Status: DC
  Administered 2010-11-11 – 2010-11-13 (×3): 1 g via INTRAVENOUS
  Filled 2010-11-11 (×4): qty 10

## 2010-11-11 MED ORDER — METOPROLOL TARTRATE 25 MG/10 ML ORAL SUSPENSION
25.0000 mg | Freq: Once | ORAL | Status: DC
  Filled 2010-11-11: qty 10

## 2010-11-11 MED ORDER — TRAMADOL HCL 50 MG PO TABS
25.0000 mg | ORAL_TABLET | Freq: Four times a day (QID) | ORAL | Status: DC | PRN
  Administered 2010-11-11 – 2010-11-13 (×2): 25 mg via ORAL
  Filled 2010-11-11 (×2): qty 1

## 2010-11-11 MED ORDER — MORPHINE SULFATE 2 MG/ML IJ SOLN: 2.0000 mg | INTRAMUSCULAR | Status: DC | PRN

## 2010-11-11 MED ORDER — DILTIAZEM HCL 30 MG PO TABS
30.0000 mg | ORAL_TABLET | Freq: Four times a day (QID) | ORAL | Status: DC
  Administered 2010-11-11 – 2010-11-12 (×5): 30 mg via ORAL
  Filled 2010-11-11 (×8): qty 1

## 2010-11-11 NOTE — Progress Notes (Signed)
Subjective: Demented, still confused, but much calmer, and no longer needing restraints. Not in acute distress at this time. Objective: Vital signs in last 24 hours: Temp:  [98.5 F (36.9 C)-99.6 F (37.6 C)] 99 F (37.2 C) (11/10 0629) Pulse Rate:  [44-124] 112  (11/10 0629) Resp:  [16-22] 18  (11/10 0629) BP: (95-167)/(46-82) 120/76 mmHg (11/10 0629) SpO2:  [92 %-100 %] 99 % (11/10 0629) FiO2 (%):  [98 %-100 %] 100 % (11/09 1245) Weight:  [52.4 kg (115 lb 8.3 oz)] 115 lb 8.3 oz (52.4 kg) (11/10 0002) Weight change: 0.1 kg (3.5 oz) Last BM Date: 11/11/10  Intake/Output from previous day: 11/09 0701 - 11/10 0700 In: 550 [I.V.:550] Out: 1450 [Urine:1450] Total I/O In: 75 [P.O.:75] Out: -    Physical Exam:  General: Sleeping but easily arousable, not short of breath at rest.  HEENT:  No clinical pallor, no jaundice, no conjunctival injection, hydration appears fair. NECK:  Supple, JVP not seen, no carotid bruits, no palpable lymphadenopathy, no palpable goiter. CHEST:  Clinically clear to auscultation, no wheezes, no crackles. HEART:  Sounds 1 and 2 heard, normal, irregular, tachycardic, no murmurs. ABDOMEN:  Flat, soft, non-tender, no palpable organomegaly, no palpable masses, normal bowel sounds. LOWER EXTREMITIES:  No pitting edema, palpable peripheral pulses. MUSCULOSKELETAL SYSTEM:  Generalized osteoarthritic changes, otherwise, normal. CENTRAL NERVOUS SYSTEM:  No focal neurologic deficit on gross examination.  Lab Results:  [facility] 11/11/10 0800 11/11/10 0034 11/09/10 1731  WBC -- -- 10.5  HGB 12.1 12.4 --  HCT 35.9* 37.6 --  PLT -- -- 218    Basename 11/11/10 0034 11/10/10 0320  NA 132* 135  K 3.9 3.7  CL 99 102  CO2 26 24  GLUCOSE 108* 151*  BUN 11 19  CREATININE 0.69 0.74  CALCIUM 9.0 8.7   Recent Results (from the past 240 hour(s))  SURGICAL PCR SCREEN     Status: Normal   Collection Time   11/09/10 10:01 PM      Component Value Range Status Comment    MRSA, PCR NEGATIVE  NEGATIVE  Final    Staphylococcus aureus NEGATIVE  NEGATIVE  Final      Studies/Results: No results found.  Medications: Scheduled Meds:    . acetaminophen  650 mg Oral Once  . diphenhydrAMINE  25 mg Intravenous Once  . metoprolol      . metoprolol  5 mg Intravenous Q6H  . pantoprazole  40 mg Oral Q0600  . DISCONTD: pantoprazole (PROTONIX) IV  40 mg Intravenous Q12H   Continuous Infusions:    . 0.9 % NaCl with KCl 20 mEq / L 75 mL/hr at 11/11/10 0200  . diltiazem (CARDIZEM) infusion 10 mg/hr (11/11/10 1124)   PRN Meds:.DISCONTD: fentaNYL, DISCONTD: midazolam  Assessment/Plan:  Principal Problem:  *Atrial fibrillation with RVR: Now in rate-controlled atrial fibrillation, with HR between 60-87, on cardiac monitor. Will change Cardizem and beta-Blocker to to oral. TSH is normal. Active Problems:  1. GI bleed: Hemodynamically stable, HB is stable and reasonable. No further bleed noted today. EGD of 11/10/10, done by Dr Elnoria Howard, showed only mild gastritis, while flexible sigmoidoscopy revealed ischemic colitis. We shall manage per GI recommendations.  2. Dementia: Stable, although baseline mental status is unclear at this time.  3. UTI: Based on positive urinary sediment. We shall start intravenous Rocephin and await urine culture results.    LOS: 2 days   Stephanne Greeley,CHRISTOPHER 11/11/2010, 12:06 PM

## 2010-11-11 NOTE — Progress Notes (Signed)
I am covering this patient today for Drs. Nicholes Mango.   Subjective: EGD yesterday with Dr. Elnoria Howard was normal;  Flex sig showed "severe ischemic colitis" changes up to 30cm.  She is demented and does not give a reliable history.  Denies pain however.  Small smear of rectal blood on pad last night.  Not sure if she ate well last night.  Objective: Vital signs in last 24 hours: Temp:  [98.5 F (36.9 C)-99.6 F (37.6 C)] 99 F (37.2 C) (11/10 0629) Pulse Rate:  [44-124] 112  (11/10 0629) Resp:  [12-22] 18  (11/10 0629) BP: (95-167)/(43-82) 120/76 mmHg (11/10 0629) SpO2:  [92 %-100 %] 99 % (11/10 0629) FiO2 (%):  [98 %-100 %] 100 % (11/09 1245) Weight:  [52.4 kg (115 lb 8.3 oz)] 115 lb 8.3 oz (52.4 kg) (11/10 0002) Last BM Date: 11/10/10 General: alert and oriented times zero, she is crying Heart: IRR IRR Abdomen: soft, non-tender, non-distended, normal bowel sounds    Lab Results:  Basename 11/11/10 0800 11/11/10 0034 11/10/10 1530 11/09/10 1731  WBC -- -- -- 10.5  HGB 12.1 12.4 12.1 --  PLT -- -- -- 218  MCV -- -- -- 93.9   BMET  Basename 11/11/10 0034 11/10/10 0320 11/09/10 1731  NA 132* 135 140  K 3.9 3.7 4.3  CL 99 102 101  CO2 26 24 26   GLUCOSE 108* 151* 138*  BUN 11 19 21   CREATININE 0.69 0.74 0.70  CALCIUM 9.0 8.7 10.3   LFT  Basename 11/09/10 1731  PROT 8.1  ALBUMIN 3.9  AST 33  ALT 22  ALKPHOS 89  BILITOT 0.7  BILIDIR --  IBILI --   PT/INR  Basename 11/10/10 0035  INR 1.03     Assessment/Plan: 77 y.o. female with ischemic colitis, afib  WBC was normal at admit and she is not tender on exam so I think continuing supportive care is a good plan.  Would not start abx unless she deteriorates clinically.  I have changed her PPI to oral (she was on one ppi once daily at admit) and have d/c'd orders to hem check her stool and q 8 hour h/h orders.  She will get cbc tomorrow am.   Rob Bunting, MD  11/11/2010, 8:30 AM [address]  Gastroentorology Pager [phone]

## 2010-11-11 NOTE — Plan of Care (Signed)
Problem: Phase I Progression Outcomes Goal: Voiding-avoid urinary catheter unless indicated Outcome: Not Progressing Patient unable to void independently at present; #14 foley cathter remains intact Goal: Hemodynamically stable Outcome: Not Progressing Cardizem drip for irregular heart rate-monitoring accordingly.

## 2010-11-12 LAB — BASIC METABOLIC PANEL
BUN: 11 mg/dL (ref 6–23)
Chloride: 102 mEq/L (ref 96–112)
GFR calc Af Amer: 89 mL/min — ABNORMAL LOW (ref >90)
Potassium: 3 mEq/L — ABNORMAL LOW (ref 3.5–5.1)
Sodium: 135 mEq/L (ref 135–145)

## 2010-11-12 LAB — CBC
HCT: 33.1 % — ABNORMAL LOW (ref 36.0–46.0)
Platelets: 188 10*3/uL (ref 150–400)
RDW: 12.9 % (ref 11.5–15.5)
WBC: 8.6 10*3/uL (ref 4.0–10.5)

## 2010-11-12 MED ORDER — DILTIAZEM HCL 30 MG PO TABS
30.0000 mg | ORAL_TABLET | Freq: Four times a day (QID) | ORAL | Status: AC
  Administered 2010-11-12 – 2010-11-13 (×2): 30 mg via ORAL
  Filled 2010-11-12 (×2): qty 1

## 2010-11-12 MED ORDER — PANTOPRAZOLE SODIUM 40 MG PO PACK
40.0000 mg | PACK | Freq: Every day | ORAL | Status: DC
  Administered 2010-11-13 – 2010-11-14 (×2): 40 mg
  Filled 2010-11-12 (×4): qty 20

## 2010-11-12 MED ORDER — DILTIAZEM HCL ER COATED BEADS 120 MG PO CP24
120.0000 mg | ORAL_CAPSULE | Freq: Every day | ORAL | Status: DC
  Administered 2010-11-13 – 2010-11-14 (×2): 120 mg via ORAL
  Filled 2010-11-12 (×2): qty 1

## 2010-11-12 MED ORDER — DILTIAZEM HCL ER COATED BEADS 120 MG PO CP24: 120.0000 mg | ORAL_CAPSULE | Freq: Every day | ORAL | Status: DC

## 2010-11-12 NOTE — Progress Notes (Signed)
Subjective: Demented, still confused, but much calmer. Not in acute distress at this time.   Objective: Vital signs in last 24 hours: Temp:  [98.2 F (36.8 C)-98.8 F (37.1 C)] 98.2 F (36.8 C) (11/11 0700) Pulse Rate:  [74-82] 74  (11/11 0700) Resp:  [18-20] 18  (11/11 0700) BP: (107-113)/(61-71) 113/71 mmHg (11/11 0700) SpO2:  [96 %-98 %] 96 % (11/11 0700) Weight change:  Last BM Date: 11/11/10  Intake/Output from previous day: 11/10 0701 - 11/11 0700 In: 295 [P.O.:295] Out: 450 [Urine:450] Total I/O In: 250 [P.O.:200; IV Piggyback:50] Out: 450 [Urine:450]   Physical Exam:  General: Sleeping but easily arousable, not short of breath at rest.  HEENT:  No clinical pallor, no jaundice, no conjunctival injection, hydration appears fair. NECK:  Supple, JVP not seen, no carotid bruits, no palpable lymphadenopathy, no palpable goiter. CHEST:  Clinically clear to auscultation, no wheezes, no crackles. HEART:  Sounds 1 and 2 heard, normal, irregular, tachycardic, no murmurs. ABDOMEN:  Flat, soft, non-tender, no palpable organomegaly, no palpable masses, normal bowel sounds. LOWER EXTREMITIES:  No pitting edema, palpable peripheral pulses. MUSCULOSKELETAL SYSTEM:  Generalized osteoarthritic changes, otherwise, normal. CENTRAL NERVOUS SYSTEM:  No focal neurologic deficit on gross examination.  Lab Results:  Basename 11/12/10 0521 11/11/10 0800 11/09/10 1731  WBC 8.6 -- 10.5  HGB 10.9* 12.1 --  HCT 33.1* 35.9* --  PLT 188 -- 218    Basename 11/12/10 0521 11/11/10 0034  NA 135 132*  K 3.0* 3.9  CL 102 99  CO2 25 26  GLUCOSE 94 108*  BUN 11 11  CREATININE 0.69 0.69  CALCIUM 8.6 9.0   Recent Results (from the past 240 hour(s))  SURGICAL PCR SCREEN     Status: Normal   Collection Time   11/09/10 10:01 PM      Component Value Range Status Comment   MRSA, PCR NEGATIVE  NEGATIVE  Final    Staphylococcus aureus NEGATIVE  NEGATIVE  Final   URINE CULTURE     Status: Normal  (Preliminary result)   Collection Time   11/10/10  4:25 PM      Component Value Range Status Comment   Specimen Description URINE, CATHETERIZED   Final    Special Requests NONE   Final    Setup Time [id number]   Final    Colony Count >=100,000 COLONIES/ML   Final    Culture ESCHERICHIA COLI   Final    Report Status PENDING   Incomplete      Studies/Results: No results found.  Medications: Scheduled Meds:    . acetaminophen  650 mg Oral Once  . cefTRIAXone (ROCEPHIN) IV  1 g Intravenous Q24H  . diltiazem  30 mg Oral Q6H  . diphenhydrAMINE  25 mg Intravenous Once  . metoprolol tartrate  25 mg Oral BID  . metoprolol tartrate  25 mg Oral Once  . pantoprazole  40 mg Oral Q0600  . sodium chloride  10 mL Intravenous Q12H  . DISCONTD: metoprolol  5 mg Intravenous Q6H   Continuous Infusions:    . DISCONTD: 0.9 % NaCl with KCl 20 mEq / L 75 mL/hr at 11/11/10 0200  . DISCONTD: diltiazem (CARDIZEM) infusion 10 mg/hr (11/11/10 1124)   PRN Meds:.morphine injection, traMADol, DISCONTD: morphine sulfate (PF)  Assessment/Plan:  Principal Problem:  *Atrial fibrillation with RVR: Now in rate-controlled atrial fibrillation, with HR between 60-87, on cardiac monitor. On oral Lopressor and Cardizem. Will change to controlled release preparation from 11/03/10. TSH is normal.  Active Problems:  1. GI bleed: Secondary to ischemic colitis. HB is stable and reasonable. No further bleed noted. We shall manage per GI recommendations. Mild gastritis noted on EGD is of no clinical consequence.  2. Dementia: Stable, although baseline mental status is unclear at this time.  3. UTI: Secondary to E. Coli. Continue Rocephin day# 2/7 and await sensitivity results.    LOS: 3 days   Sivan Cuello,CHRISTOPHER 11/12/2010, 11:58 AM

## 2010-11-12 NOTE — Plan of Care (Signed)
Dr Loni Dolly chg the Protonix to liquid form-can't crush pill, & pt had very difficult time swallowing pill this am--like to never got it down even with ice cream & use of H20 po-thanks-nsg

## 2010-11-12 NOTE — Progress Notes (Signed)
  [facility] Progress Note  Subjective: No complaints per patient, though noted hx of dementia.  Calm in mittens and posey vest.    Objective: Vital signs in last 24 hours: Temp:  [98.2 F (36.8 C)-98.8 F (37.1 C)] 98.2 F (36.8 C) (11/11 0700) Pulse Rate:  [74-82] 74  (11/11 0700) Resp:  [18-20] 18  (11/11 0700) BP: (107-113)/(61-71) 113/71 mmHg (11/11 0700) SpO2:  [96 %-98 %] 96 % (11/11 0700) Last BM Date: 11/11/10 Gen: awake, alert, NAD HEENT: anicteric, op clear CV: RRR, no mrg Pulm: CTA b/l Abd: soft, NT/ND, +BS throughout Ext: no c/c/e  Intake/Output from previous day: 11/10 0701 - 11/11 0700 In: 295 [P.O.:295] Out: 450 [Urine:450] Intake/Output this shift: Total I/O In: 250 [P.O.:200; IV Piggyback:50] Out: 450 [Urine:450]  Lab Results:  [facility] 11/12/10 0521 11/11/10 0800 11/11/10 0034 11/09/10 1731  WBC 8.6 -- -- 10.5  HGB 10.9* 12.1 12.4 --  HCT 33.1* 35.9* 37.6 --  PLT 188 -- -- 218   BMET  Basename 11/12/10 0521 11/11/10 0034 11/10/10 0320  NA 135 132* 135  K 3.0* 3.9 3.7  CL 102 99 102  CO2 25 26 24   GLUCOSE 94 108* 151*  BUN 11 11 19   CREATININE 0.69 0.69 0.74  CALCIUM 8.6 9.0 8.7   LFT  Basename 11/09/10 1731  PROT 8.1  ALBUMIN 3.9  AST 33  ALT 22  ALKPHOS 89  BILITOT 0.7  BILIDIR --  IBILI --   PT/INR  Basename 11/10/10 0035  LABPROT 13.7  INR 1.03    Assessment / Plan: 77 y.o. female with ischemic colitis, afib    Exam is benign today.  Continue supportive care.  Hgb down only 1g.  Continue to monitor.  Do not think at present she needs abx. We we follow.    LOS: 3 days   Laurelin Elson M  11/12/2010, 12:25 PM

## 2010-11-13 ENCOUNTER — Encounter (HOSPITAL_COMMUNITY): Payer: Self-pay | Admitting: Internal Medicine

## 2010-11-13 ENCOUNTER — Encounter (HOSPITAL_COMMUNITY): Payer: Self-pay

## 2010-11-13 DIAGNOSIS — E876 Hypokalemia: Secondary | ICD-10-CM | POA: Diagnosis not present

## 2010-11-13 DIAGNOSIS — B9620 Unspecified Escherichia coli [E. coli] as the cause of diseases classified elsewhere: Secondary | ICD-10-CM | POA: Diagnosis present

## 2010-11-13 LAB — CBC
HCT: 34.3 % — ABNORMAL LOW (ref 36.0–46.0)
Hemoglobin: 11.3 g/dL — ABNORMAL LOW (ref 12.0–15.0)
RBC: 3.62 MIL/uL — ABNORMAL LOW (ref 3.87–5.11)
WBC: 9 10*3/uL (ref 4.0–10.5)

## 2010-11-13 LAB — BASIC METABOLIC PANEL
BUN: 11 mg/dL (ref 6–23)
Chloride: 103 mEq/L (ref 96–112)
Glucose, Bld: 102 mg/dL — ABNORMAL HIGH (ref 70–99)
Potassium: 3.3 mEq/L — ABNORMAL LOW (ref 3.5–5.1)
Sodium: 136 mEq/L (ref 135–145)

## 2010-11-13 LAB — URINE CULTURE: Culture  Setup Time: 201211100119

## 2010-11-13 MED ORDER — POTASSIUM CHLORIDE CRYS ER 20 MEQ PO TBCR
40.0000 meq | EXTENDED_RELEASE_TABLET | Freq: Once | ORAL | Status: AC
  Administered 2010-11-13: 40 meq via ORAL
  Filled 2010-11-13: qty 2

## 2010-11-13 NOTE — Progress Notes (Signed)
Physical Therapy Evaluation Patient Details Name: Stacey Huang MRN: [id number] DOB: September 15, 1922 Today's Date: 11/13/2010 Time: 4696-2952   Eval I Problem List:  Patient Active Problem List  Diagnoses  . Atrial fibrillation  . Gastro - esophageal reflux disease  . GI bleed  . Atrial fibrillation with RVR  . Dementia  . E-coli UTI  . Hypokalemia    Past Medical History:  Past Medical History  Diagnosis Date  . Alzheimer disease   . Atrial fibrillation   . Gastro - esophageal reflux disease   . E-coli UTI   . Hypokalemia    Past Surgical History:  Past Surgical History  Procedure Date  . Unknown     PT Assessment/Plan/Recommendation PT Assessment Clinical Impression Statement: Pt presents with diagnosis of of GI bleed. Pt presents with no further acute PT needs secondary to pt will have necessary level of assist by caregivers/facility on discharge. Recommend return to SNF dementia unit. PT Recommendation/Assessment: Patent does not need any further PT services No Skilled PT: Patient will have necessary level of assist by caregiver/facility at discharge PT Recommendation Follow Up Recommendations: None;Other (comment) (Recommend return to SNF dementia unit.) Equipment Recommended: None recommended by PT PT Goals     PT Evaluation Precautions/Restrictions  Precautions Precautions: Fall Precaution Comments: Pt in posey belt and mittens. Prior Functioning  Home Living Lives With: Other (Comment) (Dementia unit at SNF) Type of Home: Skilled Nursing Facility (Dementia unit) Prior Function Level of Independence:  (Pt unable to state.) Cognition Cognition Arousal/Alertness: Awake/alert Overall Cognitive Status: Impaired Attention: Impaired Current Attention Level: Sustained Memory: Appears impaired Orientation Level: Other (Comment);Disoriented X4 (Pt is disoriented -unable to state name during session) Following Commands: Follows one step commands with increased  time Sensation/Coordination   Extremity Assessment RLE Assessment RLE Assessment: Within Functional Limits LLE Assessment LLE Assessment: Within Functional Limits Mobility (including Balance) Bed Mobility Bed Mobility: Yes Supine to Sit: 4: Min assist Supine to Sit Details (indicate cue type and reason): Increased time. Assist to initiate activity and to encourage pt to participate Sit to Supine - Right: 4: Min assist Sit to Supine - Right Details (indicate cue type and reason): Assist to get LEs onto bed and control descent of trunk Transfers Transfers: Yes Sit to Stand: 4: Min assist Sit to Stand Details (indicate cue type and reason): Assist to steady. Stand to Sit: 4: Min assist Stand to Sit Details: Assist to guide to location and control descent Ambulation/Gait Ambulation/Gait: Yes Ambulation Distance (Feet): 75 Feet Assistive device: Rolling walker Gait Pattern: Decreased step length - right;Decreased step length - left;Decreased stride length;Decreased weight shift to right;Decreased weight shift to left  Posture/Postural Control Posture/Postural Control: No significant limitations Exercise    End of Session PT - End of Session Equipment Utilized During Treatment: Gait belt Activity Tolerance: Patient tolerated treatment well Patient left: in bed;with call bell in reach;with bed alarm set;with restraints reapplied Nurse Communication: Mobility status for transfers;Mobility status for ambulation General Behavior During Session: [facility] for tasks performed Cognition: Impaired, at baseline  Rebeca Alert [facility] 11/13/2010, 4:48 PM

## 2010-11-13 NOTE — Progress Notes (Signed)
Subjective: No complaints. Calm. NAD  Objective: Vital signs Filed Vitals:   11/12/10 1349 11/12/10 2100 11/13/10 0615 11/13/10 1300  BP: 101/49 117/73 104/65 116/70  Pulse:  82 86 93  Temp:  98 F (36.7 C) 98.5 F (36.9 C) 98.4 F (36.9 C)  TempSrc:  Axillary Oral Oral  Resp:  18 20 18   Height:      Weight:      SpO2:  99% 97% 95%   Weight change:  Last BM Date: 11/12/10 (incontinent)  Intake/Output from previous day: 11/11 0701 - 11/12 0700 In: 845 [P.O.:795; IV Piggyback:50] Out: 1100 [Urine:1100] Total I/O In: 60 [P.O.:60] Out: -    Physical Exam: General: Eyes closed. Easily aroused. NAD  HEENT: No bruits, no goiter. Heart: irregullarly irregular rhythm, without murmurs, rubs, gallops. Lungs: Clear to auscultation bilaterally. No wheeze or rhonchq Abdomen: Soft, nontender, nondistended, positive bowel sounds. Extremities: No clubbing cyanosis or edema with positive pedal pulses. Neuro: Grossly intact, nonfocal.    Lab Results: Basic Metabolic Panel:  Basename 11/13/10 0440 11/12/10 0521  NA 136 135  K 3.3* 3.0*  CL 103 102  CO2 25 25  GLUCOSE 102* 94  BUN 11 11  CREATININE 0.65 0.69  CALCIUM 8.6 8.6  MG -- --  PHOS -- --   Liver Function Tests: No results found for this basename: AST:2,ALT:2,ALKPHOS:2,BILITOT:2,PROT:2,ALBUMIN:2 in the last 72 hours No results found for this basename: LIPASE:2,AMYLASE:2 in the last 72 hours No results found for this basename: AMMONIA:2 in the last 72 hours CBC:  Basename 11/13/10 0440 11/12/10 0521  WBC 9.0 8.6  NEUTROABS -- --  HGB 11.3* 10.9*  HCT 34.3* 33.1*  MCV 94.8 95.1  PLT 223 188   Cardiac Enzymes: No results found for this basename: CKTOTAL:3,CKMB:3,CKMBINDEX:3,TROPONINI:3 in the last 72 hours BNP: No results found for this basename: POCBNP:3 in the last 72 hours D-Dimer: No results found for this basename: DDIMER:2 in the last 72 hours CBG: No results found for this basename: GLUCAP:6 in the  last 72 hours Hemoglobin A1C: No results found for this basename: HGBA1C in the last 72 hours Fasting Lipid Panel: No results found for this basename: CHOL,HDL,LDLCALC,TRIG,CHOLHDL,LDLDIRECT in the last 72 hours Thyroid Function Tests:  Basename 11/10/10 1530  TSH 0.913  T4TOTAL --  FREET4 --  T3FREE --  THYROIDAB --   Anemia Panel: No results found for this basename: VITAMINB12,FOLATE,FERRITIN,TIBC,IRON,RETICCTPCT in the last 72 hours Coagulation: No results found for this basename: LABPROT:2,INR:2 in the last 72 hours Urine Drug Screen:  Alcohol Level: No results found for this basename: ETH:2 in the last 72 hours Urinalysis:  Misc. Labs:  Recent Results (from the past 240 hour(s))  SURGICAL PCR SCREEN     Status: Normal   Collection Time   11/09/10 10:01 PM      Component Value Range Status Comment   MRSA, PCR NEGATIVE  NEGATIVE  Final    Staphylococcus aureus NEGATIVE  NEGATIVE  Final   URINE CULTURE     Status: Normal   Collection Time   11/10/10  4:25 PM      Component Value Range Status Comment   Specimen Description URINE, CATHETERIZED   Final    Special Requests NONE   Final    Setup Time [id number]   Final    Colony Count >=100,000 COLONIES/ML   Final    Culture ESCHERICHIA COLI   Final    Report Status 11/13/2010 FINAL   Final    Organism ID, Bacteria  ESCHERICHIA COLI   Final     Studies/Results: No results found.  Medications: Scheduled Meds:   . acetaminophen  650 mg Oral Once  . cefTRIAXone (ROCEPHIN) IV  1 g Intravenous Q24H  . diltiazem  120 mg Oral Daily  . diltiazem  30 mg Oral Q6H  . diphenhydrAMINE  25 mg Intravenous Once  . metoprolol tartrate  25 mg Oral BID  . metoprolol tartrate  25 mg Oral Once  . pantoprazole sodium  40 mg Per Tube Q breakfast  . sodium chloride  10 mL Intravenous Q12H  . DISCONTD: pantoprazole  40 mg Oral Q0600   Continuous Infusions:  PRN Meds:.morphine injection, traMADol  Assessment/Plan:  Principal  Problem: 1. *Atrial fibrillation with RVR: rate controlled in range of 80-97. Continue lopressor and cardizem. Controlled release cardizem started today Active Problems: 2. GI bleed: secondary to ischemic colitis. Hg stable. No s/sx of blding. Appreciated GI assistance.  3. Dementia: stable  4. Mild hypokalemia. Will replete and recheck 5. UTI: secondary to E. Coli. Rocephin #3/7. Sensitive per results.    LOS: 4 days   [facility] M 11/13/2010, 2:07 PM  I have reviewed patient's clinical data, personally examined patient, and discussed with NP.  I concur with NP's assessment and plan.  Comment:  1. Continue current management.  2. Aim discharge on 11/14/10.  If E. Coli sensitivities not obtained by 11/14/10, will transition to oral Levaquin for discharge, to complete 7 days treatment.   C. Tieshia Rettinger. MD.

## 2010-11-13 NOTE — Progress Notes (Signed)
I have reviewed patient's clinical data, personally examined patient, and discussed with NP. I concur with NP's assessment and plan.  Comment: 1. Continue current management. 2. Aim discharge on 11/14/10.  If E. Coli sensitivities not obtained by 11/14/10, will transition to oral Levaquin for discharge, to complete 7 days treatment.

## 2010-11-13 NOTE — Progress Notes (Signed)
  Subjective: No acute events.  The patient denies any problems, but she also has significant dementia.  Objective: Vital signs in last 24 hours: Temp:  [97.7 F (36.5 C)-98.5 F (36.9 C)] 98.5 F (36.9 C) (11/12 0615) Pulse Rate:  [82-86] 86  (11/12 0615) Resp:  [18-20] 20  (11/12 0615) BP: (94-117)/(49-73) 104/65 mmHg (11/12 0615) SpO2:  [97 %-100 %] 97 % (11/12 0615) Last BM Date: 11/12/10 (incontinent)  Intake/Output from previous day: 11/11 0701 - 11/12 0700 In: 845 [P.O.:795; IV Piggyback:50] Out: 1100 [Urine:1100] Intake/Output this shift:    Resp: clear to auscultation bilaterally  Lab Results:  Basename 11/13/10 0440 11/12/10 0521 11/11/10 0800  WBC 9.0 8.6 --  HGB 11.3* 10.9* 12.1  HCT 34.3* 33.1* 35.9*  PLT 223 188 --   BMET  Basename 11/13/10 0440 11/12/10 0521 11/11/10 0034  NA 136 135 132*  K 3.3* 3.0* 3.9  CL 103 102 99  CO2 25 25 26   GLUCOSE 102* 94 108*  BUN 11 11 11   CREATININE 0.65 0.69 0.69  CALCIUM 8.6 8.6 9.0   LFT No results found for this basename: PROT,ALBUMIN,AST,ALT,ALKPHOS,BILITOT,BILIDIR,IBILI in the last 72 hours PT/INR No results found for this basename: LABPROT:2,INR:2 in the last 72 hours Hepatitis Panel No results found for this basename: HEPBSAG,HCVAB,HEPAIGM,HEPBIGM in the last 72 hours C-Diff No results found for this basename: CDIFFTOX:3 in the last 72 hours Fecal Lactopherrin No results found for this basename: FECLLACTOFRN in the last 72 hours  Studies/Results: No results found.  Medications: I have reviewed the patient's current medications.  Assessment/Plan: 1) Ischemic colitis - The patient is stable.  Her HGB remains stable.  No further GI evaluation at this time.  LOS: 4 days   Krishna Heuer D 11/13/2010, 1:12 PM

## 2010-11-14 LAB — CBC
HCT: 35.6 % — ABNORMAL LOW (ref 36.0–46.0)
Hemoglobin: 11.9 g/dL — ABNORMAL LOW (ref 12.0–15.0)
MCHC: 33.4 g/dL (ref 30.0–36.0)
MCV: 94.4 fL (ref 78.0–100.0)
RDW: 12.8 % (ref 11.5–15.5)
WBC: 7.1 10*3/uL (ref 4.0–10.5)

## 2010-11-14 LAB — BASIC METABOLIC PANEL
BUN: 13 mg/dL (ref 6–23)
CO2: 29 mEq/L (ref 19–32)
Chloride: 101 mEq/L (ref 96–112)
Creatinine, Ser: 0.71 mg/dL (ref 0.50–1.10)
Potassium: 3.8 mEq/L (ref 3.5–5.1)

## 2010-11-14 LAB — TYPE AND SCREEN
ABO/RH(D): A POS
Unit division: 0

## 2010-11-14 MED ORDER — CIPROFLOXACIN HCL 500 MG PO TABS: 500.0000 mg | ORAL_TABLET | Freq: Two times a day (BID) | ORAL | Status: DC

## 2010-11-14 MED ORDER — TRAMADOL HCL 50 MG PO TABS: 25.0000 mg | ORAL_TABLET | Freq: Four times a day (QID) | ORAL | Status: DC | PRN

## 2010-11-14 MED ORDER — POTASSIUM CHLORIDE ER 10 MEQ PO TBCR: 20.0000 meq | EXTENDED_RELEASE_TABLET | Freq: Every day | ORAL | Status: DC

## 2010-11-14 MED ORDER — DILTIAZEM HCL ER COATED BEADS 120 MG PO CP24: 120.0000 mg | ORAL_CAPSULE | Freq: Every day | ORAL | Status: DC

## 2010-11-14 NOTE — Progress Notes (Signed)
Patient remains to be calm and quiet, no signs and symptoms of restlessness and anxiety.

## 2010-11-14 NOTE — Progress Notes (Cosign Needed)
Subjective: Denies pain. Calm cooperative. NAD  Objective: Vital signs Filed Vitals:   11/13/10 1300 11/13/10 2114 11/14/10 0532 11/14/10 0614  BP: 116/70 120/76 127/71 126/57  Pulse: 93 101 90 68  Temp: 98.4 F (36.9 C) 98.6 F (37 C) 98.5 F (36.9 C)   TempSrc: Oral Axillary Axillary   Resp: 18 18 18    Height:      Weight:      SpO2: 95% 99% 97%    Weight change:  Last BM Date: 11/12/10  Intake/Output from previous day: 11/12 0701 - 11/13 0700 In: 85 [P.O.:85] Out: 775 [Urine:775] Total I/O In: 60 [P.O.:60] Out: -    Physical Exam: General: Alert, awake oriented to self only. Unable to follow commands. NAD HEENT: No bruits, no goiter. Heart: Irregularly irregular rate and rhythm, without murmurs, rubs, gallops. Lungs: Clear to auscultation bilaterally. No wheeze or rhonche Abdomen: Soft, nontender, nondistended, positive bowel sounds. Extremities: No clubbing cyanosis or edema with positive pedal pulses. Neuro: Grossly intact, nonfocal. MOA. Facial symmetry. Cooperative. No mitts or posey    Lab Results: Basic Metabolic Panel:  Basename 11/13/10 0440 11/12/10 0521  NA 136 135  K 3.3* 3.0*  CL 103 102  CO2 25 25  GLUCOSE 102* 94  BUN 11 11  CREATININE 0.65 0.69  CALCIUM 8.6 8.6  MG -- --  PHOS -- --   Liver Function Tests: No results found for this basename: AST:2,ALT:2,ALKPHOS:2,BILITOT:2,PROT:2,ALBUMIN:2 in the last 72 hours No results found for this basename: LIPASE:2,AMYLASE:2 in the last 72 hours No results found for this basename: AMMONIA:2 in the last 72 hours CBC:  Basename 11/13/10 0440 11/12/10 0521  WBC 9.0 8.6  NEUTROABS -- --  HGB 11.3* 10.9*  HCT 34.3* 33.1*  MCV 94.8 95.1  PLT 223 188   Cardiac Enzymes: No results found for this basename: CKTOTAL:3,CKMB:3,CKMBINDEX:3,TROPONINI:3 in the last 72 hours BNP: No results found for this basename: POCBNP:3 in the last 72 hours D-Dimer: No results found for this basename: DDIMER:2 in  the last 72 hours CBG: No results found for this basename: GLUCAP:6 in the last 72 hours Hemoglobin A1C: No results found for this basename: HGBA1C in the last 72 hours Fasting Lipid Panel: No results found for this basename: CHOL,HDL,LDLCALC,TRIG,CHOLHDL,LDLDIRECT in the last 72 hours Thyroid Function Tests: No results found for this basename: TSH,T4TOTAL,FREET4,T3FREE,THYROIDAB in the last 72 hours Anemia Panel: No results found for this basename: VITAMINB12,FOLATE,FERRITIN,TIBC,IRON,RETICCTPCT in the last 72 hours Coagulation: No results found for this basename: LABPROT:2,INR:2 in the last 72 hours Urine Drug Screen:  Alcohol Level: No results found for this basename: ETH:2 in the last 72 hours Urinalysis:  Misc. Labs:  Recent Results (from the past 240 hour(s))  SURGICAL PCR SCREEN     Status: Normal   Collection Time   11/09/10 10:01 PM      Component Value Range Status Comment   MRSA, PCR NEGATIVE  NEGATIVE  Final    Staphylococcus aureus NEGATIVE  NEGATIVE  Final   URINE CULTURE     Status: Normal   Collection Time   11/10/10  4:25 PM      Component Value Range Status Comment   Specimen Description URINE, CATHETERIZED   Final    Special Requests NONE   Final    Setup Time [id number]   Final    Colony Count >=100,000 COLONIES/ML   Final    Culture ESCHERICHIA COLI   Final    Report Status 11/13/2010 FINAL   Final  Organism ID, Bacteria ESCHERICHIA COLI   Final     Studies/Results: No results found.  Medications: Scheduled Meds:   . acetaminophen  650 mg Oral Once  . cefTRIAXone (ROCEPHIN) IV  1 g Intravenous Q24H  . diltiazem  120 mg Oral Daily  . diphenhydrAMINE  25 mg Intravenous Once  . metoprolol tartrate  25 mg Oral BID  . metoprolol tartrate  25 mg Oral Once  . pantoprazole sodium  40 mg Per Tube Q breakfast  . potassium chloride  40 mEq Oral Once  . sodium chloride  10 mL Intravenous Q12H   Continuous Infusions:  PRN Meds:.morphine injection,  traMADol  Assessment/Plan:  Principal Problem: 1 *Atrial fibrillation with RVR: rate controlled. Rate range 79-100. Continue lopressor and cardizem Active Problems: 2. GI bleed: secondary to ischemic colitis. HG stable. No s/sx of blding. 3. Dementia: stable. Very close to baseline 4. Mild hypokalemia. Will replete and recheck in am 5. UTI E Coli. Day #4 of Rocephin. Will switch to po cipro 6. Disposition: back to facility when out of restraints 24hrs. Most likely tomorrow.  LOS: 5 days   [facility] M 11/14/2010, 10:48 AM

## 2010-11-14 NOTE — Progress Notes (Signed)
Patient restraints discontinued, no longer approriate for behavior,.Calm and quiet at this time no signs and symptoms of anxiety or agitation.

## 2010-11-14 NOTE — Progress Notes (Signed)
Pt to D/C back to [facility] via family transport today. NSG from [facility] screened pt for readmission 11/12 and found her appropriate to return. NSG  from [facility] is aware pt is less than 24 hrs without restraints.

## 2010-11-14 NOTE — Discharge Summary (Signed)
Physician Discharge Summary  Patient ID: Brieana Shimmin MRN: [id number] DOB/AGE: 1922-08-03 77 y.o.  Admit date: 11/09/2010 Discharge date: 11/14/2010  Primary Care Physician:  No primary provider on file.   Discharge Diagnoses:    Present on Admission:  .GI bleed .Atrial fibrillation with RVR .Dementia  Current Discharge Medication List    START taking these medications   Details  ciprofloxacin (CIPRO) 500 MG tablet Take 1 tablet (500 mg total) by mouth 2 (two) times daily. Qty: 6 tablet, Refills: 0    diltiazem (CARDIZEM CD) 120 MG 24 hr capsule Take 1 capsule (120 mg total) by mouth daily. Qty: 30 capsule, Refills: 0    potassium chloride (K-DUR) 10 MEQ tablet Take 2 tablets (20 mEq total) by mouth daily. Qty: 60 tablet, Refills: 0    traMADol (ULTRAM) 50 MG tablet Take 0.5 tablets (25 mg total) by mouth every 6 (six) hours as needed. Maximum dose= 8 tablets per day Qty: 30 tablet, Refills: 0      CONTINUE these medications which have NOT CHANGED   Details  aspirin 81 MG tablet Take 81 mg by mouth daily.      atenolol (TENORMIN) 25 MG tablet Take 25 mg by mouth daily.      Calcium Carbonate-Vitamin D (CALCIUM 600+D) 600-400 MG-UNIT per tablet Take 1 tablet by mouth daily.      cholecalciferol (VITAMIN D) 1000 UNITS tablet Take 1,000 Units by mouth daily.      feeding supplement (ENSURE IMMUNE HEALTH) LIQD Take 237 mLs by mouth daily.      ferrous sulfate 220 (44 FE) MG/5ML solution Take 330 mg by mouth daily. At noon      memantine (NAMENDA) 5 MG tablet Take 5 mg by mouth 2 (two) times daily.      Nutritional Supplements (UTYMAX) PACK Take 1 packet by mouth daily.      omeprazole (PRILOSEC) 20 MG capsule Take 20 mg by mouth daily. Do not crush or chew     Skin Protectants, Misc. (BAZA PROTECT EX) Apply 1 application topically 2 (two) times daily.      acetaminophen (TYLENOL) 325 MG tablet Take 650 mg by mouth every 6 (six) hours as needed. For pain         STOP taking these medications     diltiazem (CARDIZEM) 120 MG tablet          Disposition and Follow-up: Pt medically stable and ready for discharge to facility  Consults: Dr. Elnoria Howard with GI    Significant Diagnostic Studies: EGD & Flex sigmoid No results found.  Labs Reviewed  DIFFERENTIAL - Abnormal; Notable for the following:    Neutrophils Relative 92 (*)    Neutro Abs 9.7 (*)    Lymphocytes Relative 4 (*)    Lymphs Abs 0.4 (*)    All other components within normal limits  COMPREHENSIVE METABOLIC PANEL - Abnormal; Notable for the following:    Glucose, Bld 138 (*)    GFR calc non Af Amer 76 (*)    GFR calc Af Amer 88 (*)    All other components within normal limits  BASIC METABOLIC PANEL - Abnormal; Notable for the following:    Glucose, Bld 151 (*)    GFR calc non Af Amer 75 (*)    GFR calc Af Amer 87 (*)    All other components within normal limits  URINALYSIS, ROUTINE W REFLEX MICROSCOPIC - Abnormal; Notable for the following:    Appearance CLOUDY (*)  Hgb urine dipstick TRACE (*)    Ketones, ur TRACE (*)    Nitrite POSITIVE (*)    All other components within normal limits  URINE MICROSCOPIC-ADD ON - Abnormal; Notable for the following:    Bacteria, UA MANY (*)    All other components within normal limits  BASIC METABOLIC PANEL - Abnormal; Notable for the following:    Sodium 132 (*)    Glucose, Bld 108 (*)    GFR calc non Af Amer 77 (*)    GFR calc Af Amer 89 (*)    All other components within normal limits  HEMOGLOBIN AND HEMATOCRIT, BLOOD - Abnormal; Notable for the following:    HCT 35.9 (*)    All other components within normal limits  CBC - Abnormal; Notable for the following:    RBC 3.48 (*)    Hemoglobin 10.9 (*)    HCT 33.1 (*)    All other components within normal limits  BASIC METABOLIC PANEL - Abnormal; Notable for the following:    Potassium 3.0 (*)    GFR calc non Af Amer 77 (*)    GFR calc Af Amer 89 (*)    All other components  within normal limits  CBC - Abnormal; Notable for the following:    RBC 3.62 (*)    Hemoglobin 11.3 (*)    HCT 34.3 (*)    All other components within normal limits  BASIC METABOLIC PANEL - Abnormal; Notable for the following:    Potassium 3.3 (*)    Glucose, Bld 102 (*)    GFR calc non Af Amer 78 (*)    All other components within normal limits  CBC - Abnormal; Notable for the following:    RBC 3.77 (*)    Hemoglobin 11.9 (*)    HCT 35.6 (*)    All other components within normal limits  CBC  LACTIC ACID, PLASMA  SURGICAL PCR SCREEN  HEMOGLOBIN AND HEMATOCRIT, BLOOD  HEMOGLOBIN AND HEMATOCRIT, BLOOD  PREPARE RBC (CROSSMATCH)  PROTIME-INR  HEMOGLOBIN AND HEMATOCRIT, BLOOD  HEMOGLOBIN AND HEMATOCRIT, BLOOD  TYPE AND SCREEN  OCCULT BLOOD X 1 CARD TO LAB, STOOL  ABO/RH  TSH  URINE CULTURE  PATHOLOGY REPORT - SCANNED  BASIC METABOLIC PANEL     Procedure(s): ESOPHAGOGASTRODUODENOSCOPY (EGD) FLEXIBLE SIGMOIDOSCOPY   No results found.     Brief H and P: For complete details please refer to admission H and P, but in brief . This is a demented female resident of a nursing home, who was transferred here from [facility] with GI bleed. At the NH she was noted to have GI bleed and was sent to the ER. She had frank melena in the [place] ER, here she had a stool, no obvious blood. Dr Elnoria Howard was contacted by the physician at [facility] and agreed to see patient. Patient's hemoglobin is 13. She has A fib, now with RVR. She is not on anti-coagulation. She is on a baby ASA. I am unable to obtain any history from the patient, she has severe dementia.     Hospital Course:  No resolved problems to display.  Active Hospital Problems  Diagnoses Date Noted   . Atrial fibrillation with RVR 11/09/2010   . GI bleed 11/09/2010   . Dementia 11/09/2010     Resolved Hospital Problems  Diagnoses Date Noted Date Resolved    Principal Problem:  1*Atrial fibrillation with RVR: rate controlled. BB  and cardizem. Continue Active Problems: 2. GI bleed:  secondary to ischemic colitis. Pt seen by GI. Underwent upper endoscopy and sigmoidoscopy. Results consistent with ischemic colitis. Provided with PPI. HG monitored closely and stable. No active blding during hospitalization.  3. Dementia: initially with delerium in dementia. Urine with UTI. Antibiotics started. After 24 hrs pt back at baseline. At time of discharge pt at baseline 4. Mild hypokalemia. repleted and potassium started daily. Will nee BMET 11/15 to eval potassium level and continued need for supplementation. 5. UTI E Coli. Completed 4 days IV rocephin. Will finish with 3days Cipro as C&S indicates sensitive.  Time spent on Discharge: 45 minutes  Signed: Gwenyth Bender 11/14/2010, 12:31 PM  Comment: Patient stable for discharge today as planned.

## 2010-11-14 NOTE — Plan of Care (Signed)
Problem: Problem: Neuro Progression Goal: APPROPRIATE MENTAL STATUS Outcome: Not Progressing Patient has alhzeimers

## 2010-11-22 ENCOUNTER — Emergency Department (HOSPITAL_BASED_OUTPATIENT_CLINIC_OR_DEPARTMENT_OTHER)
Admission: EM | Admit: 2010-11-22 | Discharge: 2010-11-22 | Disposition: A | Discharge status: Home / Self Care | Payer: Medicare Other | Source: Home / Self Care | Attending: Emergency Medicine | Admitting: Emergency Medicine

## 2010-11-22 ENCOUNTER — Encounter (HOSPITAL_BASED_OUTPATIENT_CLINIC_OR_DEPARTMENT_OTHER): Payer: Self-pay | Admitting: *Deleted

## 2010-11-22 ENCOUNTER — Encounter (HOSPITAL_COMMUNITY): Payer: Self-pay

## 2010-11-22 ENCOUNTER — Emergency Department (HOSPITAL_COMMUNITY): Payer: Medicare Other

## 2010-11-22 ENCOUNTER — Other Ambulatory Visit: Payer: Self-pay

## 2010-11-22 ENCOUNTER — Inpatient Hospital Stay (HOSPITAL_COMMUNITY)
Admission: EM | Admit: 2010-11-22 | Discharge: 2010-11-24 | DRG: 310 | Disposition: A | Discharge status: Nursing Home (Includes ICF) | Payer: Medicare Other | Source: Ambulatory Visit | Attending: Internal Medicine | Admitting: Internal Medicine

## 2010-11-22 DIAGNOSIS — G309 Alzheimer's disease, unspecified: Secondary | ICD-10-CM | POA: Diagnosis present

## 2010-11-22 DIAGNOSIS — Y9210 Unspecified residential institution as the place of occurrence of the external cause: Secondary | ICD-10-CM | POA: Insufficient documentation

## 2010-11-22 DIAGNOSIS — Z833 Family history of diabetes mellitus: Secondary | ICD-10-CM

## 2010-11-22 DIAGNOSIS — F0280 Dementia in other diseases classified elsewhere without behavioral disturbance: Secondary | ICD-10-CM | POA: Insufficient documentation

## 2010-11-22 DIAGNOSIS — I4891 Unspecified atrial fibrillation: Principal | ICD-10-CM | POA: Diagnosis present

## 2010-11-22 DIAGNOSIS — W19XXXA Unspecified fall, initial encounter: Secondary | ICD-10-CM | POA: Insufficient documentation

## 2010-11-22 DIAGNOSIS — Z7982 Long term (current) use of aspirin: Secondary | ICD-10-CM

## 2010-11-22 DIAGNOSIS — T148XXA Other injury of unspecified body region, initial encounter: Secondary | ICD-10-CM

## 2010-11-22 DIAGNOSIS — Z79899 Other long term (current) drug therapy: Secondary | ICD-10-CM

## 2010-11-22 DIAGNOSIS — S0993XA Unspecified injury of face, initial encounter: Secondary | ICD-10-CM | POA: Insufficient documentation

## 2010-11-22 DIAGNOSIS — S0083XA Contusion of other part of head, initial encounter: Secondary | ICD-10-CM | POA: Diagnosis present

## 2010-11-22 DIAGNOSIS — Z66 Do not resuscitate: Secondary | ICD-10-CM | POA: Diagnosis present

## 2010-11-22 DIAGNOSIS — S0033XA Contusion of nose, initial encounter: Secondary | ICD-10-CM | POA: Diagnosis present

## 2010-11-22 DIAGNOSIS — S0003XA Contusion of scalp, initial encounter: Secondary | ICD-10-CM | POA: Diagnosis present

## 2010-11-22 DIAGNOSIS — Y92009 Unspecified place in unspecified non-institutional (private) residence as the place of occurrence of the external cause: Secondary | ICD-10-CM

## 2010-11-22 DIAGNOSIS — Z9181 History of falling: Secondary | ICD-10-CM

## 2010-11-22 DIAGNOSIS — W1809XA Striking against other object with subsequent fall, initial encounter: Secondary | ICD-10-CM | POA: Diagnosis present

## 2010-11-22 DIAGNOSIS — R58 Hemorrhage, not elsewhere classified: Secondary | ICD-10-CM

## 2010-11-22 DIAGNOSIS — K219 Gastro-esophageal reflux disease without esophagitis: Secondary | ICD-10-CM | POA: Insufficient documentation

## 2010-11-22 HISTORY — DX: Vascular disorder of intestine, unspecified: K55.9

## 2010-11-22 LAB — URINALYSIS, ROUTINE W REFLEX MICROSCOPIC
Bilirubin Urine: NEGATIVE
Hgb urine dipstick: NEGATIVE
Ketones, ur: 15 mg/dL — AB
Nitrite: NEGATIVE
pH: 6.5 (ref 5.0–8.0)

## 2010-11-22 LAB — DIFFERENTIAL
Basophils Absolute: 0 10*3/uL (ref 0.0–0.1)
Basophils Relative: 1 % (ref 0–1)
Lymphocytes Relative: 27 % (ref 12–46)
Monocytes Absolute: 0.6 10*3/uL (ref 0.1–1.0)
Neutro Abs: 4.9 10*3/uL (ref 1.7–7.7)
Neutrophils Relative %: 64 % (ref 43–77)

## 2010-11-22 LAB — BASIC METABOLIC PANEL
CO2: 29 mEq/L (ref 19–32)
Chloride: 103 mEq/L (ref 96–112)
GFR calc Af Amer: 88 mL/min — ABNORMAL LOW (ref >90)
Potassium: 3.7 mEq/L (ref 3.5–5.1)
Sodium: 140 mEq/L (ref 135–145)

## 2010-11-22 LAB — CBC
HCT: 37.1 % (ref 36.0–46.0)
Hemoglobin: 12.2 g/dL (ref 12.0–15.0)
MCH: 31 pg (ref 26.0–34.0)
MCHC: 32.9 g/dL (ref 30.0–36.0)
MCV: 94.4 fL (ref 78.0–100.0)
Platelets: 373 K/uL (ref 150–400)
RBC: 3.93 MIL/uL (ref 3.87–5.11)
RDW: 12.6 % (ref 11.5–15.5)
WBC: 7.6 K/uL (ref 4.0–10.5)

## 2010-11-22 LAB — GLUCOSE, CAPILLARY

## 2010-11-22 LAB — PROTIME-INR
INR: 0.99 (ref 0.00–1.49)
Prothrombin Time: 13.3 seconds (ref 11.6–15.2)

## 2010-11-22 LAB — LACTIC ACID, PLASMA: Lactic Acid, Venous: 1.4 mmol/L (ref 0.5–2.2)

## 2010-11-22 LAB — APTT: aPTT: 30 s (ref 24–37)

## 2010-11-22 LAB — LIPASE, BLOOD: Lipase: 27 U/L (ref 11–59)

## 2010-11-22 MED ORDER — PANTOPRAZOLE SODIUM 40 MG PO TBEC
40.0000 mg | DELAYED_RELEASE_TABLET | Freq: Every day | ORAL | Status: DC
  Administered 2010-11-23 – 2010-11-24 (×2): 40 mg via ORAL
  Filled 2010-11-22 (×2): qty 1

## 2010-11-22 MED ORDER — MEMANTINE HCL 5 MG PO TABS
5.0000 mg | ORAL_TABLET | Freq: Two times a day (BID) | ORAL | Status: DC
  Administered 2010-11-23 – 2010-11-24 (×3): 5 mg via ORAL
  Filled 2010-11-22 (×6): qty 1

## 2010-11-22 MED ORDER — DILTIAZEM HCL 25 MG/5ML IV SOLN
5.0000 mg | Freq: Once | INTRAVENOUS | Status: AC
  Administered 2010-11-22: 25 mg via INTRAVENOUS

## 2010-11-22 MED ORDER — DILTIAZEM HCL 50 MG/10ML IV SOLN: 25.0000 mg | Freq: Once | INTRAVENOUS | Status: DC

## 2010-11-22 MED ORDER — SODIUM CHLORIDE 0.9 % IV SOLN
Freq: Once | INTRAVENOUS | Status: AC
  Administered 2010-11-22: 05:00:00 via INTRAVENOUS

## 2010-11-22 MED ORDER — DILTIAZEM HCL 25 MG/5ML IV SOLN
INTRAVENOUS | Status: AC
  Administered 2010-11-22: 25 mg via INTRAVENOUS
  Filled 2010-11-22: qty 5

## 2010-11-22 MED ORDER — DILTIAZEM LOAD VIA INFUSION
25.0000 mg | Freq: Once | INTRAVENOUS | Status: AC
  Administered 2010-11-22: 25 mg via INTRAVENOUS
  Filled 2010-11-22: qty 25

## 2010-11-22 MED ORDER — DILTIAZEM HCL 100 MG IV SOLR
5.0000 mg/h | INTRAVENOUS | Status: DC
  Administered 2010-11-22: 10 mg/h via INTRAVENOUS
  Administered 2010-11-23: 5 mg/h via INTRAVENOUS
  Filled 2010-11-22 (×2): qty 100

## 2010-11-22 MED ORDER — MORPHINE SULFATE 2 MG/ML IJ SOLN
2.0000 mg | Freq: Once | INTRAMUSCULAR | Status: AC
  Administered 2010-11-22: 2 mg via INTRAVENOUS
  Filled 2010-11-22: qty 1

## 2010-11-22 MED ORDER — DILTIAZEM HCL 50 MG/10ML IV SOLN
25.0000 mg | Freq: Once | INTRAVENOUS | Status: DC
  Filled 2010-11-22: qty 5

## 2010-11-22 MED ORDER — DILTIAZEM HCL 25 MG/5ML IV SOLN
20.0000 mg | Freq: Once | INTRAVENOUS | Status: AC
  Administered 2010-11-22: 25 mg via INTRAVENOUS
  Filled 2010-11-22: qty 5

## 2010-11-22 NOTE — ED Notes (Signed)
Call placed for pt transport to [facility].

## 2010-11-22 NOTE — ED Notes (Signed)
Fast heart rate onset this a.m.--Has had similar which was treated with Cardizem in the recent past

## 2010-11-22 NOTE — ED Notes (Signed)
B/p 111/52  HR  117  Cardizem bolus (1st) and drip started

## 2010-11-22 NOTE — ED Provider Notes (Signed)
History     CSN: [id number] Arrival date & time: 11/22/2010  4:23 AM   First MD Initiated Contact with Patient 11/22/10 0424      Chief Complaint  Patient presents with  . Fall  . Abrasion    (Consider location/radiation/quality/duration/timing/severity/associated sxs/prior treatment) HPI Level 5 Caveat: Dementia This is an 77 year old white female with Alzheimer's disease was found on the floor nursing facility this morning. It is unknown if she had a mechanical fall versus precipitated by a syncopal event. EMS was called to transport the patient here for evaluation per nursing home policy. The only reported injury noted is swelling and abrasions of the left side of the face. The patient is reportedly at her baseline mental status.  Past Medical History  Diagnosis Date  . Alzheimer disease   . Atrial fibrillation   . Gastro - esophageal reflux disease   . E-coli UTI   . Hypokalemia     Past Surgical History  Procedure Date  . Unknown     History reviewed. No pertinent family history.  History  Substance Use Topics  . Smoking status: Never Smoker   . Smokeless tobacco: Never Used  . Alcohol Use: No    OB History    Grav Para Term Preterm Abortions TAB SAB Ect Mult Living                  Review of Systems  Unable to perform ROS   Allergies  Review of patient's allergies indicates no known allergies.  Home Medications   Current Outpatient Rx  Name Route Sig Dispense Refill  . ACETAMINOPHEN 325 MG PO TABS Oral Take 650 mg by mouth every 6 (six) hours as needed. For pain     . ASPIRIN 81 MG PO TABS Oral Take 81 mg by mouth daily.      . ATENOLOL 25 MG PO TABS Oral Take 25 mg by mouth daily.      Marland Kitchen CALCIUM CARBONATE-VITAMIN D 600-400 MG-UNIT PO TABS Oral Take 1 tablet by mouth daily.      Marland Kitchen VITAMIN D 1000 UNITS PO TABS Oral Take 1,000 Units by mouth daily.      Marland Kitchen CIPROFLOXACIN HCL 500 MG PO TABS Oral Take 1 tablet (500 mg total) by mouth 2 (two) times  daily. 6 tablet 0  . DILTIAZEM HCL COATED BEADS 120 MG PO CP24 Oral Take 1 capsule (120 mg total) by mouth daily. 30 capsule 0  . ENSURE IMMUNE HEALTH PO LIQD Oral Take 237 mLs by mouth daily.      Marland Kitchen FERROUS SULFATE 220 (44 FE) MG/5ML PO ELIX Oral Take 330 mg by mouth daily. At noon      . MEMANTINE HCL 5 MG PO TABS Oral Take 5 mg by mouth 2 (two) times daily.      Marland Kitchen UTYMAX PO PACK Oral Take 1 packet by mouth daily.      Marland Kitchen OMEPRAZOLE 20 MG PO CPDR Oral Take 20 mg by mouth daily. Do not crush or chew     . POTASSIUM CHLORIDE CR 10 MEQ PO TBCR Oral Take 2 tablets (20 mEq total) by mouth daily. 60 tablet 0  . BAZA PROTECT EX Apply externally Apply 1 application topically 2 (two) times daily.      . TRAMADOL HCL 50 MG PO TABS Oral Take 0.5 tablets (25 mg total) by mouth every 6 (six) hours as needed. Maximum dose= 8 tablets per day 30 tablet 0    BP  144/83  Pulse 128  Temp(Src) 98 F (36.7 C) (Axillary)  Resp 20  SpO2 100%  Physical Exam General: Well-developed, well-nourished female in no acute distress; appearance consistent with age of record HENT: normocephalic; swelling and abrasions over her left maxillary area left upper lip and left chin without bony deformity or crepitus palpable; no loose teeth palpated Eyes: pupils equal round and reactive to light; extraocular muscle function cannot be formally assessed due to patient's dementia but appear intact Neck: supple; nontender Heart: Regular rate and rhythm; tachycardic; cardiac monitor shows atrial flutter with 2:1 conduction and occasional PVCs Lungs: clear to auscultation bilaterally Abdomen: soft; nontender; nondistended Extremities: No deformity; pulses normal; no tenderness; arthritic changes of hands Neurologic: Awake, alert but confused and unable to converse; motor function intact in all extremities and symmetric; no facial droop Skin: Warm and dry Psychiatric: Anxious; tearful    ED Course  Procedures (including  critical care time)    MDM   EKG Interpretation:  Date & Time: 11/22/2010 4:51 AM  Rate: 126  Rhythm: atrial flutter and 2:1 conduction  QRS Axis: normal  Intervals: normal  ST/T Wave abnormalities: indeterminate  Conduction Disutrbances:none  Narrative Interpretation:   Old EKG Reviewed: unchanged  5:21 AM Patient's rate down to about 100 after 25 mg of Cardizem IV. Rhythm is atrial flutter and atrial fibrillation. It is noted that on hospitalization 11/09/2010 that she had atrial flutter with 21 conduction. She was rate controlled and discharged on diltiazem and atenolol. It is noted that she had a GI bleed and no anticoagulation was prescribed on discharge.  Nursing notes and vitals signs, including pulse oximetry, reviewed.  Summary of this visit's results, reviewed by myself:  Labs:  Results for orders placed during the hospital encounter of 11/22/10  URINALYSIS, ROUTINE W REFLEX MICROSCOPIC      Component Value Range   Color, Urine YELLOW  YELLOW    Appearance CLEAR  CLEAR    Specific Gravity, Urine 1.016  1.005 - 1.030    pH 6.5  5.0 - 8.0    Glucose, UA NEGATIVE  NEGATIVE (mg/dL)   Hgb urine dipstick NEGATIVE  NEGATIVE    Bilirubin Urine NEGATIVE  NEGATIVE    Ketones, ur 15 (*) NEGATIVE (mg/dL)   Protein, ur NEGATIVE  NEGATIVE (mg/dL)   Urobilinogen, UA 0.2  0.0 - 1.0 (mg/dL)   Nitrite NEGATIVE  NEGATIVE    Leukocytes, UA NEGATIVE  NEGATIVE    5:59 AM Rate improved after an additional 5 mg of Cardizem IC. Rate is in the 90s and low 100s. Patient is on diltiazem and atenolol daily for rate control. She has not yet received her morning doses.            Hanley Seamen, MD 11/22/10 [phone]

## 2010-11-22 NOTE — ED Notes (Signed)
B/P 109/63  HR  94 few PVC's  Pt. Appears asymptomatic  Pulse Ox 100% with 02 at 2 liters

## 2010-11-22 NOTE — ED Notes (Signed)
Pt resting with eyes closed, NAD noted, IV site unremarkable. Pt remains a-fib/a-flutter on monitor.

## 2010-11-22 NOTE — ED Notes (Signed)
B/P 125/51  HR 93--Family Member at bedside,

## 2010-11-22 NOTE — ED Notes (Signed)
un witnessed fell from NH, pt has abrasion to left cheek area, no other injuries noted or voiced.

## 2010-11-22 NOTE — H&P (Signed)
History and Physical  Stacey Huang FAO:[id number] DOB: August 08, 1922 DOA: 11/22/2010  Referring physician: PCP: Florentina Jenny, MD   Chief Complaint: Fast heart rate  HPI:  77 year old woman found on the floor of the nursing home this morning about 4:30 AM by nursing staff. It is unclear how long she had been on the floor. The etiology of her fall was also unclear. She is transported to [facility] where she was found to have atrial fibrillation with rapid ventricular response. She was given a bolus of Cardizem and her heart rate subsequently became controlled. She was transferred back to the nursing home in about 7:00 this morning.  The patient was transported back to the emergency department (now at [facility]) later this afternoon and was again found to have atrial fibrillation with rapid ventricular response. Of note she had not been given her Cardizem at the nursing home today. Apparently she complained only of left cheek pain and some abdominal pain. She was again given IV Cardizem and is currently rate controlled with an infusion of 5 mg an hour. Because of her fall CT of the head and maxillofacial region was obtained which showed no evidence of fracture but did show a left-sided preseptal hematoma and possible hemorrhage in the maxillary sinus. Because of her atrial fibrillation hospitalist consultation was obtained.  The patient can provide no history because of her dementia. Her son corroborates the above history. He notes that they had a party last night at her living facility and that she was probably a plate.  History is notable for admission earlier this month for gross melena. She underwent upper endoscopy November 9 which was unremarkable. She underwent flexible sigmoidoscopy November 9 which was notable for ischemic colitis and hemorrhoids. She was placed on PPI therapy and discharged home.  Review of Systems:  Unobtainable.  Past Medical History  Diagnosis Date  .  Alzheimer disease   . Atrial fibrillation   . Gastro - esophageal reflux disease   . Ischemic colitis, enteritis, or enterocolitis    Past Surgical History  Procedure Date  . Unknown   Son reports no past surgeries.  Social History:  reports that she has never smoked. She has never used smokeless tobacco. She reports that she does not drink alcohol. Her drug history not on file.  No Known Allergies  Family History  Problem Relation Age of Onset  . Diabetes Mother     Prior to Admission medications   Medication Sig Start Date End Date Taking? Authorizing Provider  acetaminophen (TYLENOL) 325 MG tablet Take 650 mg by mouth every 6 (six) hours as needed. For pain    Yes Historical Provider, MD  aspirin 81 MG tablet Take 81 mg by mouth daily.     Yes Historical Provider, MD  atenolol (TENORMIN) 25 MG tablet Take 25 mg by mouth daily.     Yes Historical Provider, MD  Calcium Carbonate-Vitamin D (CALCIUM 600+D) 600-400 MG-UNIT per tablet Take 1 tablet by mouth daily.     Yes Historical Provider, MD  cholecalciferol (VITAMIN D) 1000 UNITS tablet Take 1,000 Units by mouth daily.     Yes Historical Provider, MD  diltiazem (CARDIZEM CD) 120 MG 24 hr capsule Take 1 capsule (120 mg total) by mouth daily. 11/14/10 11/14/11 Yes Lesle Chris Black, NP  feeding supplement (ENSURE IMMUNE HEALTH) LIQD Take 237 mLs by mouth daily.     Yes Historical Provider, MD  ferrous sulfate 220 (44 FE) MG/5ML solution Take 330 mg by  mouth daily. At noon     Yes Historical Provider, MD  memantine (NAMENDA) 5 MG tablet Take 5 mg by mouth 2 (two) times daily.     Yes Historical Provider, MD  Nutritional Supplements (UTYMAX) PACK Take 1 packet by mouth daily.     Yes Historical Provider, MD  omeprazole (PRILOSEC) 20 MG capsule Take 20 mg by mouth daily. Do not crush or chew    Yes Historical Provider, MD  potassium chloride (K-DUR) 10 MEQ tablet Take 20 mEq by mouth daily. Pt takes 2 tabs  11/14/10  Yes Gwenyth Bender, NP    Skin Protectants, Misc. (BAZA PROTECT EX) Apply 1 application topically 2 (two) times daily.     Yes Historical Provider, MD  traMADol (ULTRAM) 50 MG tablet Take 25 mg by mouth every 6 (six) hours as needed. Maximum dose= 8 tablets per day   Pt takes 1/2 tab  11/14/10 11/24/10 Yes Gwenyth Bender, NP   Physical Exam: Blood pressure 121/74, pulse 131, temperature 98.5 F (36.9 C), temperature source Oral, resp. rate 22, height 5\' 4"  (1.626 m), weight 52.617 kg (116 lb), SpO2 94.00%.   General:  Lying flat on a stretcher. Calm and comfortable. Nontoxic.  Eyes: Will not open her eyes for me. Even with gentle finger zone I of the time unable to examine her irises and pupils. Her face is notable for bruising over the left maxilla.   ENT: The external nares appear unremarkable. There is no bleeding.  Neck: No lymphadenopathy or masses no thyromegaly.  Cardiovascular: Regular rate and rhythm. Currently in the 80s. No murmur or gallop. No large joint edema.  Respiratory: Clear to patient bilaterally. No wheezes, rales or rhonchi.  Abdomen: Soft, nontender nondistended.  Skin: Grossly unremarkable except as described above.  Musculoskeletal: Moves upper and lower extremities spontaneously. Grossly normal tone in the upper lower extremities. Does not follow commands.  Psychiatric: Assessable  Neurologic: Exam limited, but documented above.  Labs on Admission:  Basic Metabolic Panel:  Lab 11/22/10 2536  NA 140  K 3.7  CL 103  CO2 29  GLUCOSE 91  BUN 18  CREATININE 0.70  CALCIUM 9.4  MG 2.3  PHOS --    Lab 11/22/10 1640  LIPASE 27  AMYLASE --   CBC:  Lab 11/22/10 1640  WBC 7.6  NEUTROABS 4.9  HGB 12.2  HCT 37.1  MCV 94.4  PLT 373   CBG:  Lab 11/22/10 1605  GLUCAP 74   Radiological Exams on Admission: Ct Head Wo Contrast  11/22/2010  *RADIOLOGY REPORT*  Clinical Data:  Status post fall.  Left cheek hematoma.  CT HEAD WITHOUT CONTRAST CT MAXILLOFACIAL WITHOUT  CONTRAST  Technique:  Multidetector CT imaging of the head and maxillofacial structures were performed using the standard protocol without intravenous contrast. Multiplanar CT image reconstructions of the maxillofacial structures were also generated.  Comparison:  06/07/2010  CT HEAD  Findings: There is diffuse patchy low density throughout the subcortical and periventricular white matter consistent with chronic small vessel ischemic change.  There is prominence of the sulci and ventricles consistent with brain atrophy.  There is no evidence for acute brain infarct, hemorrhage or mass.  There is a fluid level identified within the left maxillary sinus. The other paranasal sinuses are clear.  The mastoid air cells are clear.  The skull appears intact.  IMPRESSION:  1.  No acute intracranial abnormalities noted. 2.  Small vessel ischemic disease and brain atrophy.  CT MAXILLOFACIAL  Findings:   Left-sided preseptal hematoma is identified which measures approximately 0.7 cm in thickness.  There is a fluid level within the left maxillary sinus which may represent hemorrhage.  There is no evidence for orbital blowout fracture.  The zygomatic arches and mandible are intact.  The sinuses are intact.  IMPRESSION:  1.  Left-sided preseptal hematoma and fluid level within the left maxillary sinus. 2.  No evidence for facial bone fracture.  Original Report Authenticated By: Rosealee Albee, M.D.   Dg Chest Port 1 View  11/22/2010  *RADIOLOGY REPORT*  Clinical Data: Atrial fibrillation, status post fall  PORTABLE CHEST - 1 VIEW  Comparison: 06/07/2010  Findings: Cardiomegaly again noted.  No acute infiltrate or pleural effusion.  No pulmonary edema.  No gross fractures are identified. No diagnostic pneumothorax.  Stable degenerative changes bilateral acromioclavicular joints.  IMPRESSION: No active disease.  Cardiomegaly again noted.  No diagnostic pneumothorax.  Original Report Authenticated By: Natasha Mead, M.D.   Ct  Maxillofacial Wo Cm  11/22/2010  *RADIOLOGY REPORT*  Clinical Data:  Status post fall.  Left cheek hematoma.  CT HEAD WITHOUT CONTRAST CT MAXILLOFACIAL WITHOUT CONTRAST  Technique:  Multidetector CT imaging of the head and maxillofacial structures were performed using the standard protocol without intravenous contrast. Multiplanar CT image reconstructions of the maxillofacial structures were also generated.  Comparison:  06/07/2010  CT HEAD  Findings: There is diffuse patchy low density throughout the subcortical and periventricular white matter consistent with chronic small vessel ischemic change.  There is prominence of the sulci and ventricles consistent with brain atrophy.  There is no evidence for acute brain infarct, hemorrhage or mass.  There is a fluid level identified within the left maxillary sinus. The other paranasal sinuses are clear.  The mastoid air cells are clear.  The skull appears intact.  IMPRESSION:  1.  No acute intracranial abnormalities noted. 2.  Small vessel ischemic disease and brain atrophy.  CT MAXILLOFACIAL  Findings:   Left-sided preseptal hematoma is identified which measures approximately 0.7 cm in thickness.  There is a fluid level within the left maxillary sinus which may represent hemorrhage.  There is no evidence for orbital blowout fracture.  The zygomatic arches and mandible are intact.  The sinuses are intact.  IMPRESSION:  1.  Left-sided preseptal hematoma and fluid level within the left maxillary sinus. 2.  No evidence for facial bone fracture.  Original Report Authenticated By: Rosealee Albee, M.D.   EKG: Not available for review.  Assessment/Plan 1. Atrial fibrillation with rapid ventricular response: Not a Coumadin candidate secondary to recent ischemic colitis. Continue IV diltiazem infusion. Transition 2. Fall: Mechanical etiology suspected. Non-focal exam. No further evaluation. 3. Preseptal hematoma/possible maxillary hemorrhage: Maxillofacial trauma has  been consulted by the emergency department (Dr. Jeanice Lim). Further recommendations forthcoming. 4. History of ischemic colitis, November 2012: Appears stable. 5. Alzheimer's dementia: Appears stable. Continue Namenda.  I spoke with her son and healthcare power of attorney by phone this evening. All questions answered to his apparent satisfaction. He confirms her CODE STATUS is DO NOT RESUSCITATE.  Code Status: DO NOT RESUSCITATE Family Communication: Son Chayna Surratt [facility]): [phone] Disposition Plan: Return to memory care unit when improved.    Brendia Sacks, MD  [facility] Pager [phone] 11/22/2010, 7:55 PM

## 2010-11-22 NOTE — ED Notes (Signed)
unwitnessed fall from [facility], abrasion to left cheek

## 2010-11-22 NOTE — ED Notes (Signed)
[facility] here for pt transport. Call placed to [facility] at [facility].

## 2010-11-22 NOTE — ED Notes (Signed)
Pt. With wet Attends--Peri care given and dry Attends placed on pt.

## 2010-11-22 NOTE — ED Notes (Signed)
In and out cath performed to obtain urine specimen, pt tolerated well. Peri-care given and clean brief placed on pt.

## 2010-11-22 NOTE — ED Notes (Signed)
MD at bedside. 

## 2010-11-22 NOTE — ED Notes (Signed)
NOTE........Marland KitchenThis patient was triaged by me Eliberto Ivory, RN and I was logged in under L.S. Which was an error

## 2010-11-22 NOTE — ED Provider Notes (Signed)
History     CSN: [id number] Arrival date & time: 11/22/2010  2:31 PM   First MD Initiated Contact with Patient 11/22/10 1507      Chief Complaint  Patient presents with  . Tachycardia    (Consider location/radiation/quality/duration/timing/severity/associated sxs/prior treatment) HPI  This is a 77 year old lady with PMH of Afib, Alzheimer disease, and GERD who presents to the ED with Tachycardia and s/p fall. The history is provided by patient's daughter and documentation from [facility] ED note since pt is in advanced dementia. Pt was found on the nursing facility floor at 430 am by nursing staff. It was unclear as to how long pt was on the floor. Pt was awake at that time.  It was unknown whether she had mechanic fall or her fall was precipitated by her tachycardia vs syncope. The only reported injury was  left cheek swelling and abrasion.  Pt was brought to [facility] ED by EMS and found to be in Afib with RVR of HR 130's. She treated with Cardizem 25 mg IV bolus and again 5 mg IV bolus with significant improvement of her HR down to 90's and 100's. Her UA did not indicate any UTI. Pt was transport back to her Nursing Facility at 644 am.  Pt was transport to [facility] ED again with recurrent tachycardia of Afib/Aflutter with RVR of HR 130's this afternoon. Pt only c/o left cheek pain and diffused abdominal pain.  Denies any chest pain, chest pressure or palpitation. Denies nausea, vomiting or diarrhea. No fever or chills. No SOB.   Of note, patient was recently hospitalized for GI bleeding 2/2 ischemic colitis on 11/09/10,  which was diagnosed by Gastroenterologist Dr. Elnoria Howard via upper endoscopy and sigmoidoscopy. Past Medical History  Diagnosis Date  . Alzheimer disease   . Atrial fibrillation   . Gastro - esophageal reflux disease   . E-coli UTI   . Hypokalemia     Past Surgical History  Procedure Date  . Unknown     History reviewed. No pertinent family history.  History    Substance Use Topics  . Smoking status: Never Smoker   . Smokeless tobacco: Never Used  . Alcohol Use: No    OB History    Grav Para Term Preterm Abortions TAB SAB Ect Mult Living                  Review of Systems See HPI  Allergies  Review of patient's allergies indicates no known allergies.  Home Medications   Current Outpatient Rx  Name Route Sig Dispense Refill  . ACETAMINOPHEN 325 MG PO TABS Oral Take 650 mg by mouth every 6 (six) hours as needed. For pain     . ASPIRIN 81 MG PO TABS Oral Take 81 mg by mouth daily.      . ATENOLOL 25 MG PO TABS Oral Take 25 mg by mouth daily.      Marland Kitchen CALCIUM CARBONATE-VITAMIN D 600-400 MG-UNIT PO TABS Oral Take 1 tablet by mouth daily.      Marland Kitchen VITAMIN D 1000 UNITS PO TABS Oral Take 1,000 Units by mouth daily.      Marland Kitchen DILTIAZEM HCL COATED BEADS 120 MG PO CP24 Oral Take 1 capsule (120 mg total) by mouth daily. 30 capsule 0  . ENSURE IMMUNE HEALTH PO LIQD Oral Take 237 mLs by mouth daily.      Marland Kitchen FERROUS SULFATE 220 (44 FE) MG/5ML PO ELIX Oral Take 330 mg by mouth  daily. At noon      . MEMANTINE HCL 5 MG PO TABS Oral Take 5 mg by mouth 2 (two) times daily.      Marland Kitchen UTYMAX PO PACK Oral Take 1 packet by mouth daily.      Marland Kitchen OMEPRAZOLE 20 MG PO CPDR Oral Take 20 mg by mouth daily. Do not crush or chew     . POTASSIUM CHLORIDE CR 10 MEQ PO TBCR Oral Take 20 mEq by mouth daily. Pt takes 2 tabs     . BAZA PROTECT EX Apply externally Apply 1 application topically 2 (two) times daily.      . TRAMADOL HCL 50 MG PO TABS Oral Take 25 mg by mouth every 6 (six) hours as needed. Maximum dose= 8 tablets per day   Pt takes 1/2 tab       BP 99/63  Pulse 131  Temp(Src) 98.5 F (36.9 C) (Oral)  Resp 22  Ht 5\' 4"  (1.626 m)  Wt 116 lb (52.617 kg)  BMI 19.91 kg/m2  SpO2 100%  Physical Exam Mild distress due to pain. General: eye closed and tearful, well-developed, and non-cooperative to examination.  Head: normocephalic and atraumatic.  Left cheek skin  abrasion and swelling noted. No active bleeding. No rhinorrhea. Eyes: unable to assess vision, left eye swelling. pupils equal, pupils round, pupils reactive to light, no injection and anicteric.  Mouth: pharynx pink and moist, no erythema, and no exudates.  Neck: supple, full ROM, no thyromegaly, no JVD, and no carotid bruits.  Lungs: normal respiratory effort, no accessory muscle use, normal breath sounds, no crackles, and no wheezes. Heart: irregularly irregular, no murmur, no gallop, and no rub.  Abdomen: soft, diffused tenderness noted. normal bowel sounds, no distention, no guarding, no rebound tenderness, no hepatomegaly, and no splenomegaly.  Msk: no joint swelling, no joint warmth, and no redness over joints.  Pulses: 1+ DP/PT pulses bilaterally Extremities: No cyanosis, clubbing, edema Neurologic: alert & oriented to self, B/L upper extremities strength normal. Unable to do thorough neuro assessment due to pt non cooperation 2/2 her advanced dementia. Skin: turgor normal and no rashes.    ED Course  Procedures   Date: 11/22/2010  Rate: 134  Rhythm: atrial flutter  QRS Axis: normal  Intervals: QT prolonged  ST/T Wave abnormalities: nonspecific ST/T changes  Conduction Disutrbances:none  Narrative Interpretation:   Old EKG Reviewed: changes noted  Results for orders placed during the hospital encounter of 11/22/10  CBC      Component Value Range   WBC 7.6  4.0 - 10.5 (K/uL)   RBC 3.93  3.87 - 5.11 (MIL/uL)   Hemoglobin 12.2  12.0 - 15.0 (g/dL)   HCT 16.1  09.6 - 04.5 (%)   MCV 94.4  78.0 - 100.0 (fL)   MCH 31.0  26.0 - 34.0 (pg)   MCHC 32.9  30.0 - 36.0 (g/dL)   RDW 40.9  81.1 - 91.4 (%)   Platelets 373  150 - 400 (K/uL)  DIFFERENTIAL      Component Value Range   Neutrophils Relative 64  43 - 77 (%)   Neutro Abs 4.9  1.7 - 7.7 (K/uL)   Lymphocytes Relative 27  12 - 46 (%)   Lymphs Abs 2.1  0.7 - 4.0 (K/uL)   Monocytes Relative 8  3 - 12 (%)   Monocytes Absolute  0.6  0.1 - 1.0 (K/uL)   Eosinophils Relative 1  0 - 5 (%)   Eosinophils Absolute 0.1  0.0 - 0.7 (K/uL)   Basophils Relative 1  0 - 1 (%)   Basophils Absolute 0.0  0.0 - 0.1 (K/uL)  BASIC METABOLIC PANEL      Component Value Range   Sodium 140  135 - 145 (mEq/L)   Potassium 3.7  3.5 - 5.1 (mEq/L)   Chloride 103  96 - 112 (mEq/L)   CO2 29  19 - 32 (mEq/L)   Glucose, Bld 91  70 - 99 (mg/dL)   BUN 18  6 - 23 (mg/dL)   Creatinine, Ser 9.14  0.50 - 1.10 (mg/dL)   Calcium 9.4  8.4 - 78.2 (mg/dL)   GFR calc non Af Amer 76 (*) >90 (mL/min)   GFR calc Af Amer 88 (*) >90 (mL/min)  LIPASE, BLOOD      Component Value Range   Lipase 27  11 - 59 (U/L)  APTT      Component Value Range   aPTT 30  24 - 37 (seconds)  PROTIME-INR      Component Value Range   Prothrombin Time 13.3  11.6 - 15.2 (seconds)   INR 0.99  0.00 - 1.49   LACTIC ACID, PLASMA      Component Value Range   Lactic Acid, Venous 1.4  0.5 - 2.2 (mmol/L)  GLUCOSE, CAPILLARY      Component Value Range   Glucose-Capillary 74  70 - 99 (mg/dL)   Comment 1 Documented in Chart     Comment 2 Notify RN    MAGNESIUM      Component Value Range   Magnesium 2.3  1.5 - 2.5 (mg/dL)  POCT I-STAT TROPONIN I      Component Value Range   Troponin i, poc 0.01  0.00 - 0.08 (ng/mL)   Comment 3            Ct Head Wo Contrast  11/22/2010  *RADIOLOGY REPORT*  Clinical Data:  Status post fall.  Left cheek hematoma.  CT HEAD WITHOUT CONTRAST CT MAXILLOFACIAL WITHOUT CONTRAST  Technique:  Multidetector CT imaging of the head and maxillofacial structures were performed using the standard protocol without intravenous contrast. Multiplanar CT image reconstructions of the maxillofacial structures were also generated.  Comparison:  06/07/2010  CT HEAD  Findings: There is diffuse patchy low density throughout the subcortical and periventricular white matter consistent with chronic small vessel ischemic change.  There is prominence of the sulci and ventricles  consistent with brain atrophy.  There is no evidence for acute brain infarct, hemorrhage or mass.  There is a fluid level identified within the left maxillary sinus. The other paranasal sinuses are clear.  The mastoid air cells are clear.  The skull appears intact.  IMPRESSION:  1.  No acute intracranial abnormalities noted. 2.  Small vessel ischemic disease and brain atrophy.  CT MAXILLOFACIAL  Findings:   Left-sided preseptal hematoma is identified which measures approximately 0.7 cm in thickness.  There is a fluid level within the left maxillary sinus which may represent hemorrhage.  There is no evidence for orbital blowout fracture.  The zygomatic arches and mandible are intact.  The sinuses are intact.  IMPRESSION:  1.  Left-sided preseptal hematoma and fluid level within the left maxillary sinus. 2.  No evidence for facial bone fracture.  Original Report Authenticated By: Rosealee Albee, M.D.   Dg Chest Port 1 View  11/22/2010  *RADIOLOGY REPORT*  Clinical Data: Atrial fibrillation, status post fall  PORTABLE CHEST - 1 VIEW  Comparison: 06/07/2010  Findings: Cardiomegaly again noted.  No acute infiltrate or pleural effusion.  No pulmonary edema.  No gross fractures are identified. No diagnostic pneumothorax.  Stable degenerative changes bilateral acromioclavicular joints.  IMPRESSION: No active disease.  Cardiomegaly again noted.  No diagnostic pneumothorax.  Original Report Authenticated By: Natasha Mead, M.D.   Ct Maxillofacial Wo Cm  11/22/2010  *RADIOLOGY REPORT*  Clinical Data:  Status post fall.  Left cheek hematoma.  CT HEAD WITHOUT CONTRAST CT MAXILLOFACIAL WITHOUT CONTRAST  Technique:  Multidetector CT imaging of the head and maxillofacial structures were performed using the standard protocol without intravenous contrast. Multiplanar CT image reconstructions of the maxillofacial structures were also generated.  Comparison:  06/07/2010  CT HEAD  Findings: There is diffuse patchy low density  throughout the subcortical and periventricular white matter consistent with chronic small vessel ischemic change.  There is prominence of the sulci and ventricles consistent with brain atrophy.  There is no evidence for acute brain infarct, hemorrhage or mass.  There is a fluid level identified within the left maxillary sinus. The other paranasal sinuses are clear.  The mastoid air cells are clear.  The skull appears intact.  IMPRESSION:  1.  No acute intracranial abnormalities noted. 2.  Small vessel ischemic disease and brain atrophy.  CT MAXILLOFACIAL  Findings:   Left-sided preseptal hematoma is identified which measures approximately 0.7 cm in thickness.  There is a fluid level within the left maxillary sinus which may represent hemorrhage.  There is no evidence for orbital blowout fracture.  The zygomatic arches and mandible are intact.  The sinuses are intact.  IMPRESSION:  1.  Left-sided preseptal hematoma and fluid level within the left maxillary sinus. 2.  No evidence for facial bone fracture.  Original Report Authenticated By: Rosealee Albee, M.D.      1. Afib/Flutter with RVR The clinical presentation is consistent with Afib/Flutter with RVR. The etiologies could be cardiac source, CVA, infection or s/p fall. - will check the general lab and cardiac enzymes -will administer Cardizem to control HR - will consider cardiology consult if pt condition is not controlled.  2. Fall  Left cheek swelling noted 2/2 fall this am.  - will check head CT and max/facial CT to rule any brain injury or facial injury  3. abd pain Diffused abdominal pain noted without any other abdominal symptoms and signs. Given her history of ischemic colitis, will check lactate and FOBT.   7:23 PM Talked with hospitalist team and pt will be admitted.  9:27 PM Discussed with Dr. Jeanice Lim about pt's left maxillary sinus hemorrhage. Dr. Jeanice Lim will evaluate pt.   10:27 PM Pt is confused and trying to get out of  bed. Will give her morphine 2 mg IV for her left cheek pain. Hospitalist called and notified.   Dede Query, MD 11/22/10 1622  Dede Query, MD 11/22/10 1924  Dede Query, MD 11/22/10 2129  Dede Query, MD 11/22/10 2228

## 2010-11-22 NOTE — ED Notes (Signed)
B/P 109/63  HR  95--Cardizem drip decreased to rate of 5 on pump

## 2010-11-23 ENCOUNTER — Encounter (HOSPITAL_COMMUNITY): Payer: Self-pay

## 2010-11-23 LAB — URINE CULTURE
Culture  Setup Time: 201211211409
Culture: NO GROWTH

## 2010-11-23 LAB — CBC
Hemoglobin: 9.9 g/dL — ABNORMAL LOW (ref 12.0–15.0)
MCH: 31.2 pg (ref 26.0–34.0)
MCV: 94.6 fL (ref 78.0–100.0)
RBC: 3.17 MIL/uL — ABNORMAL LOW (ref 3.87–5.11)

## 2010-11-23 LAB — BASIC METABOLIC PANEL
CO2: 28 mEq/L (ref 19–32)
Calcium: 8.6 mg/dL (ref 8.4–10.5)
Glucose, Bld: 91 mg/dL (ref 70–99)
Sodium: 142 mEq/L (ref 135–145)

## 2010-11-23 LAB — CARDIAC PANEL(CRET KIN+CKTOT+MB+TROPI): CK, MB: 2.9 ng/mL (ref 0.3–4.0)

## 2010-11-23 MED ORDER — ATENOLOL 25 MG PO TABS
25.0000 mg | ORAL_TABLET | Freq: Two times a day (BID) | ORAL | Status: DC
  Administered 2010-11-23 – 2010-11-24 (×2): 25 mg via ORAL
  Filled 2010-11-23 (×3): qty 1

## 2010-11-23 MED ORDER — ATENOLOL 25 MG PO TABS
25.0000 mg | ORAL_TABLET | Freq: Every day | ORAL | Status: DC
  Administered 2010-11-23: 25 mg via ORAL
  Filled 2010-11-23: qty 1

## 2010-11-23 MED ORDER — ONDANSETRON HCL 4 MG/2ML IJ SOLN: 4.0000 mg | Freq: Four times a day (QID) | INTRAMUSCULAR | Status: DC | PRN

## 2010-11-23 MED ORDER — SENNA 8.6 MG PO TABS
2.0000 | ORAL_TABLET | Freq: Every day | ORAL | Status: DC | PRN
  Filled 2010-11-23: qty 2

## 2010-11-23 MED ORDER — BACITRACIN-NEOMYCIN-POLYMYXIN OINTMENT TUBE
TOPICAL_OINTMENT | Freq: Two times a day (BID) | CUTANEOUS | Status: DC
  Administered 2010-11-23: 1 via TOPICAL
  Administered 2010-11-24: 11:00:00 via TOPICAL
  Filled 2010-11-23: qty 15

## 2010-11-23 MED ORDER — DILTIAZEM HCL ER COATED BEADS 120 MG PO CP24
120.0000 mg | ORAL_CAPSULE | Freq: Every day | ORAL | Status: DC
  Administered 2010-11-23 – 2010-11-24 (×2): 120 mg via ORAL
  Filled 2010-11-23 (×2): qty 1

## 2010-11-23 MED ORDER — ONDANSETRON HCL 4 MG PO TABS: 4.0000 mg | ORAL_TABLET | Freq: Four times a day (QID) | ORAL | Status: DC | PRN

## 2010-11-23 MED ORDER — ACETAMINOPHEN 325 MG PO TABS
650.0000 mg | ORAL_TABLET | Freq: Four times a day (QID) | ORAL | Status: DC | PRN
  Administered 2010-11-23: 650 mg via ORAL
  Filled 2010-11-23: qty 2

## 2010-11-23 MED ORDER — ALUM & MAG HYDROXIDE-SIMETH 200-200-20 MG/5ML PO SUSP: 30.0000 mL | Freq: Four times a day (QID) | ORAL | Status: DC | PRN

## 2010-11-23 MED ORDER — POTASSIUM CHLORIDE 10 MEQ PO TBCR
20.0000 meq | EXTENDED_RELEASE_TABLET | Freq: Every day | ORAL | Status: DC
  Administered 2010-11-23 – 2010-11-24 (×2): 20 meq via ORAL
  Filled 2010-11-23 (×2): qty 2

## 2010-11-23 NOTE — Progress Notes (Signed)
[facility] follow-up progress note  Patient transferred over from [facility] ED, as no beds available at [facility] Reviewed Chart and also POC with nursing. Pt comfortable asleep, in NSR now on tele with no issues  Further POC as per Dr. Rene Kocher note  Pleas Koch, MD [facility] (P) [phone]

## 2010-11-23 NOTE — Progress Notes (Signed)
Subjective: Patient currently resting comfortably. She wakens when examined her, but goes back to sleep. Her son is present. He stated that earlier, his mom had no complaints. No chest pain or shortness of breath  Objective: Weight change:   Intake/Output Summary (Last 24 hours) at 11/23/10 1448 Last data filed at 11/23/10 1219  Gross per 24 hour  Intake     10 ml  Output    600 ml  Net   -590 ml   BP 129/70  Pulse 59  Temp(Src) 97.3 F (36.3 C) (Oral)  Resp 18  Ht 5\' 4"  (1.626 m)  Wt 52.028 kg (114 lb 11.2 oz)  BMI 19.69 kg/m2  SpO2 99% General appearance: appears stated age, no distress and Resting comfortably Head: Normocephalic, without obvious abnormality, Facial contusions Lungs: clear to auscultation bilaterally Heart: regular rate and rhythm Abdomen: soft, non-tender; bowel sounds normal; no masses,  no organomegaly Extremities: Or clubbing, cyanosis, or edema  Lab Results: Basic Metabolic Panel:  Basename 11/23/10 0500 11/22/10 1640  NA 142 140  K 3.2* 3.7  CL 108 103  CO2 28 29  GLUCOSE 91 91  BUN 11 18  CREATININE 0.68 0.70  CALCIUM 8.6 9.4  MG -- 2.3  PHOS -- --    Basename 11/22/10 1640  LIPASE 27  AMYLASE --   CBC:  Basename 11/23/10 0500 11/22/10 1640  WBC 6.6 7.6  NEUTROABS -- 4.9  HGB 9.9* 12.2  HCT 30.0* 37.1  MCV 94.6 94.4  PLT 306 373   CBG:  Basename 11/22/10 1605  GLUCAP 74   Coagulation:  Basename 11/22/10 1640  LABPROT 13.3  INR 0.99      Studies/Results: Ct Head Wo Contrast  11/22/2010  *RADIOLOGY REPORT*  Clinical Data:  Status post fall.  Left cheek hematoma.  CT HEAD WITHOUT CONTRAST CT MAXILLOFACIAL WITHOUT CONTRAST    IMPRESSION:  1.  No acute intracranial abnormalities noted. 2.  Small vessel ischemic disease and brain atrophy.  CT MAXILLOFACIAL  Findings:    IMPRESSION:  1.  Left-sided preseptal hematoma and fluid level within the left maxillary sinus. 2.  No evidence for facial bone  fracture.   Medications: Scheduled Meds:   . atenolol  25 mg Oral Daily  . diltiazem  120 mg Oral Daily  . diltiazem  25 mg Intravenous Once  . memantine  5 mg Oral BID  .  morphine injection  2 mg Intravenous Once  . pantoprazole  40 mg Oral Q1200  . potassium chloride  20 mEq Oral Daily  . DISCONTD: diltiazem  25 mg Intravenous Once  . DISCONTD: diltiazem  25 mg Intravenous Once   Continuous Infusions:   . DISCONTD: diltiazem (CARDIZEM) infusion 5 mg/hr (11/23/10 0602)   PRN Meds:.acetaminophen, alum & mag hydroxide-simeth, ondansetron (ZOFRAN) IV, ondansetron, senna  Assessment/Plan: Patient Active Hospital Problem List: Atrial fibrillation ()   Assessment: Patient able to be weaned off Cardizem drip. The by mouth medications have been restarted. I discussed this with her son and will try to see if we can increase the patient's atenolol to 25 by mouth twice a day. It's noted that once her heart rate was controlled, then she went back to normal sinus rhythm.  History of fall (11/22/2010)  by PT evaluated the patient. She is from assisted living. She may need to go to short-term skilled nursing. Depending on PT evaluation.  Nasal septal hematoma (11/22/2010)  awaiting ENT followup.  Alzheimer's dementia (11/22/2010)  stable.   LOS: 1  day   Hollice Espy 11/23/2010, 2:48 PM

## 2010-11-23 NOTE — ED Provider Notes (Signed)
I saw and evaluated the patient, reviewed the resident's note and I agree with the findings and plan.   .Face to face Exam:  General:  Awake HEENT:  Atraumatic Resp:  Normal effort Abd:  Nondistended Neuro:No focal weakness Lymph: No adenopathy   Nelia Shi, MD 11/23/10 1109

## 2010-11-24 ENCOUNTER — Encounter (HOSPITAL_COMMUNITY): Payer: Self-pay | Admitting: Gastroenterology

## 2010-11-24 MED ORDER — BACITRACIN-NEOMYCIN-POLYMYXIN OINTMENT TUBE: 1.0000 "application " | TOPICAL_OINTMENT | Freq: Two times a day (BID) | CUTANEOUS | Status: DC

## 2010-11-24 MED ORDER — ATENOLOL 12.5 MG HALF TABLET: 12.5000 mg | ORAL_TABLET | Freq: Every day | ORAL | Status: DC

## 2010-11-24 NOTE — Progress Notes (Signed)
Physical Therapy Note: order received, chart reviewed, spoke with OT who saw pt and with pt's family. All confirm pt is at baseline functional level without further P.T. Needs. Discussed with family the benefit of a bed/chair alarm at facility if possible to alert staff when pt moving about to help prevent falls. Also discussed with family the potential of higher level of care if facility not able to appropriately supervise pt but left this to a family discussion. No further needs. Signing off. Thanks Toney Sang, PT [phone]

## 2010-11-24 NOTE — Progress Notes (Signed)
Clinical Social Work:  Pt will be discharged back to [facility] memory care unit.  CSW spoke with pt, family and facility to coordinate plans. CSW faxed discharge paperwork to facility.  Per request, family will transport pt back to facility.Tyreka Henneke, [place], [place], #[phone]

## 2010-11-24 NOTE — Progress Notes (Signed)
Occupational Therapy Evaluation Patient Details Name: Stacey Huang MRN: [id number] DOB: 1922-07-29 Today's Date: 11/24/2010 9:34-10:01  Ev II Problem List:  Patient Active Problem List  Diagnoses  . Atrial fibrillation  . Gastro - esophageal reflux disease  . Atrial fibrillation with RVR  . Dementia  . E-coli UTI  . Hypokalemia  . History of fall  . Nasal septal hematoma  . Alzheimer's dementia    Past Medical History:  Past Medical History  Diagnosis Date  . Alzheimer disease   . Atrial fibrillation   . Gastro - esophageal reflux disease   . Ischemic colitis, enteritis, or enterocolitis    Past Surgical History:  Past Surgical History  Procedure Date  . Unknown     OT Assessment/Plan/Recommendation OT Assessment Clinical Impression Statement: 77 yr old female admitted after fall and with atrial fibrilation.  Presents with advanced dementia for which she required 24 hour supervision.  Currently she requires mod to max assist for basic selfcare tasks, which according to her family is her normal baseline.  Feel pt does not exhibit the need for acute OT services at this time. Do feel she will benefit from OT eval at [facility], once she is in her familiar setting.  Her family  agrres with this assessment.  OT Recommendation/Assessment: All further OT needs can be met in the next venue of care OT Problem List: Decreased strength;Decreased activity tolerance;Decreased cognition;Decreased safety awareness;Decreased knowledge of use of DME or AE;Impaired balance (sitting and/or standing) OT Therapy Diagnosis : Generalized weakness;Altered mental status;Cognitive deficits OT Recommendation Follow Up Recommendations: Skilled nursing facility Equipment Recommended: None recommended by OT Individuals Consulted Consulted and Agree with Results and Recommendations: Patient;Family member/caregiver OT Goals    OT Evaluation Precautions/Restrictions  Precautions Precaution  Comments: Falls Restrictions Weight Bearing Restrictions: No Prior Functioning Home Living Lives With: Other (Comment) (memory care unit) Receives Help From: Other (Comment) (Staff assists PRN) Type of Home:  (Memory Care unit at [facility]) Additional Comments: Unable to obtain PLOF or environmental setup from pt secondary to.  Pt's sons came in at end of session and gave information regarding [facility] memory unit that she resides in.  Prior Function Level of Independence: Needs assistance with ADLs Bath: Moderate Toileting: Moderate Dressing: Moderate Grooming: Moderate Feeding: Maximal Driving: No Comments:  (Pt's sons report that she required supervision and cueing .) ADL ADL Eating/Feeding: Maximal assistance Where Assessed - Eating/Feeding: Bed level Grooming: Performed;Wash/dry face;Brushing hair;Teeth care;Moderate assistance Grooming Details (indicate cue type and reason): Pt needed max demonstrational hand over hand cueing for brushing her teeth.  Min assist needed for washing face and brushing hair. Where Assessed - Grooming: Standing at sink Upper Body Bathing: Simulated;Minimal assistance Upper Body Bathing Details (indicate cue type and reason): Max instructional cues to initate all tasks. Where Assessed - Upper Body Bathing: Unsupported;Sitting, bed Lower Body Bathing: Simulated;Maximal assistance Where Assessed - Lower Body Bathing: Sit to stand from bed Lower Body Dressing: Simulated;Moderate assistance;Maximal assistance Lower Body Dressing Details (indicate cue type and reason): Max assist to donn robe. Where Assessed - Lower Body Dressing: Sit to stand from bed Toilet Transfer: Simulated;Minimal assistance Toilet Transfer Method: Ambulating Toilet Transfer Equipment: Comfort height toilet Where Assessed - Toileting Clothing Manipulation: Sit to stand from 3-in-1 or toilet Toileting - Hygiene: Simulated;Moderate assistance Where Assessed - Toileting  Hygiene: Sit to stand from 3-in-1 or toilet Tub/Shower Transfer: Not assessed Tub/Shower Transfer Method: Not assessed ADL Comments: Pt requires max overall cueing (both instructional and demonstrational) for  basic self care tasks.  Per her son's this is her baseline secondary to her pre-existing dementia. Vision/Perception  Vision - History Baseline Vision: Other (comment) (Pt with bruising and abrasion under left eye.) Vision - Assessment Eye Alignment: Within Functional Limits Cognition Cognition Overall Cognitive Status: History of cognitive impairments History of Cognitive Impairment: Appears at baseline functioning Attention: Impaired Current Attention Level: Sustained Memory: Appears impaired Memory Deficits: Pt with previous history of advanced dementia. Orientation Level: Disoriented to person;Disoriented to place;Disoriented to time;Disoriented to situation Following Commands: Follows one step commands inconsistently Cognition - Other Comments: Pt unable to understand commands for brushing teeth, bringing LEs off of bed to sit up, or sit on the toilet. Sensation/Coordination Sensation Light Touch: Not tested Extremity Assessment RUE Assessment RUE Assessment: Within Functional Limits LUE Assessment LUE Assessment: Within Functional Limits Mobility  Bed Mobility Bed Mobility: Yes Supine to Sit: 3: Mod assist Transfers Transfers: Yes Sit to Stand: 4: Min assist Exercises   End of Session OT - End of Session Equipment Utilized During Treatment: Gait belt Activity Tolerance: Patient tolerated treatment well Patient left: in bed;with family/visitor present General Behavior During Session: Other (comment) (Pt crying at times.) Cognition: Impaired, at baseline   Ja Pistole OTR/L 11/24/2010, 1:24 PM  Pager number [phone]

## 2010-11-24 NOTE — Progress Notes (Signed)
Utilization review completed.  

## 2010-11-24 NOTE — Discharge Summary (Signed)
DISCHARGE SUMMARY  Stacey Huang  MR#: [id number]  DOB:1922/04/07  Date of Admission: 11/22/2010 Date of Discharge: 11/24/2010  Attending Physician:Stacey Huang  Patient's NWG:NFAOZ,HYQMV, MD  Consults: Stacey Huang, ENT  Discharge Diagnoses: Present on Admission:  .Atrial fibrillation .Nasal septal hematoma .Alzheimer's dementia Fall-recent   Current Discharge Medication List    START taking these medications   Details  neomycin-bacitracin-polymyxin (NEOSPORIN) OINT Apply 1 application topically 2 (two) times daily. Apply to abrasion on face 2x day until healed    !! tenormin (TENORMIN) 12.5 mg TABS Take 0.5 tablets (12.5 mg total) by mouth at bedtime.    CONTINUE these medications which have NOT CHANGED   Details  acetaminophen (TYLENOL) 325 MG tablet Take 650 mg by mouth every 6 (six) hours as needed. For pain     aspirin 81 MG tablet Take 81 mg by mouth daily.      !! atenolol (TENORMIN) 25 MG tablet Take 25 mg by mouth daily.      Calcium Carbonate-Vitamin D (CALCIUM 600+D) 600-400 MG-UNIT per tablet Take 1 tablet by mouth daily.      cholecalciferol (VITAMIN D) 1000 UNITS tablet Take 1,000 Units by mouth daily.      diltiazem (CARDIZEM CD) 120 MG 24 hr capsule Take 1 capsule (120 mg total) by mouth daily. Qty: 30 capsule, Refills: 0    feeding supplement (ENSURE IMMUNE HEALTH) LIQD Take 237 mLs by mouth daily.      ferrous sulfate 220 (44 FE) MG/5ML solution Take 330 mg by mouth daily. At noon      memantine (NAMENDA) 5 MG tablet Take 5 mg by mouth 2 (two) times daily.      Nutritional Supplements (UTYMAX) PACK Take 1 packet by mouth daily.      omeprazole (PRILOSEC) 20 MG capsule Take 20 mg by mouth daily. Do not crush or chew     potassium chloride (Huang-DUR) 10 MEQ tablet Take 20 mEq by mouth daily. Pt takes 2 tabs     Skin Protectants, Misc. (BAZA PROTECT EX) Apply 1 application topically 2 (two) times daily.      traMADol (ULTRAM) 50 MG  tablet Take 25 mg by mouth every 6 (six) hours as needed. Maximum dose= 8 tablets per day   Pt takes 1/2 tab                     Hospital Course: Present on Admission:  .Atrial fibrillation: Patient is an 77 year old white female with past medical history of dementia, hypertension, and atrial fibrillation, who presented after a fall to the emergency room. On evaluation she was found to have a preseptal hematoma, as well as a subcutaneous, maxillary hematoma. She was also found to be in atrial fibrillation with rapid ventricular rate. Patient was admitted to the hospitalist service and started on IV Cardizem. In regards to her atrial fibrillation by mid morning of the same day. She was able to be weaned off the drip and put back on her oral medications. I discussed this with her sons who are power of attorney about the need to help try to keep her heart rate down. It was difficult to say for atrial fibrillation caused the fall or vice versa. Patient herself would spontaneously convert back to normal sinus rhythm. Once her heart rate was controlled. She normally is on Cardizem 120-24 hour release as well as metoprolol 25 mg by mouth daily. We added a second dose of metoprolol 20 5 at night. On hospital day  one and by hospital day 2, morning. She did well with this, noting her heart rate briefly when she was asleep. Would dip down into the high 50s with a blood pressure on 1. Await. Keeping in mind that this was while she was still having some pain from her abrasions and a little bit more disoriented, and went on the conservative side and decreased dyspnea. Nightly dose of atenolol to 12.5 each bedtime I discussed this with her sons. They're amenable to this plan.   .Nasal septal hematoma: ENT was consulted in the emergency room following. CT findings. The patient was admitted. Dr. Jeanice Huang of ENT, followed up with the patient on hospital day 2. Given the fact that there were no fractures, he felt that  there was nothing operative to do. The hematoma should resolve on its own. No followup is needed unless something changes. He was able to sign off. Both of the patient's sons were present were pleased with this outcome.  .Alzheimer's dementia: The patient remained relatively stable. She is slightly tearful in the early morning of hospital day 2 secondary to some disorientation and the fact that her symptoms are not present. Currently, she is calm. She is happy about being able to go back to the assisted-living facility-memory. Care unit, where they will be able to provide proper physical and occupational therapy. The sons would prefer this rhythm. The patient going to a skilled nursing facility.  Patient is felt to be medically stable for discharge today back to assisted living   Day of Discharge BP 108/53  Pulse 57  Temp(Src) 98 F (36.7 C) (Oral)  Resp 16  Ht 5\' 4"  (1.626 m)  Wt 52.028 kg (114 lb 11.2 oz)  BMI 19.69 kg/m2  SpO2 96%  Physical Exam: General appearance: appears stated age, no distress , she is more awake today. Head: Normocephalic, without obvious abnormality, Facial contusions , she has the same abrasion on the left maxilla. With lipid more subcutaneous swelling. Does not appear to be too tender. Lungs: clear to auscultation bilaterally  Heart: Currently, she is in atrial fibrillation, but the rate is controlled Abdomen: soft, non-tender; bowel sounds normal; no masses, no organomegaly  Extremities: Or clubbing, cyanosis, or edema   Results for orders placed during the hospital encounter of 11/22/10 (from the past 24 hour(s))  CARDIAC PANEL(CRET KIN+CKTOT+MB+TROPI)     Status: Normal   Collection Time   11/23/10  4:07 PM      Component Value Range   Total CK 81  7 - 177 (U/L)   CK, MB 2.9  0.3 - 4.0 (ng/mL)   Troponin I <0.30  <0.30 (ng/mL)   Relative Index RELATIVE INDEX IS INVALID  0.0 - 2.5     Disposition: Improved, being discharged back to assisted living  facility   Follow-up Appts: Discharge Orders    Future Orders Please Complete By Expires   Diet - low sodium heart healthy      Increase activity slowly      Walk with assistance        Follow-up with Dr. Florentina Huang, PCP in 1-2 weeks.   Tests Needing Follow-up: None  Signed: Yisroel Mullendore Huang 11/24/2010, 10:59 AM

## 2010-11-24 NOTE — Progress Notes (Signed)
Clinical Social Work:  Please see shadow chart for full assessment.Stacey Huang, Stacey Huang, Stacey Huang, #[phone]

## 2010-11-24 NOTE — Consult Note (Signed)
Reason for Consult:Left facial hematoma Referring Physician: Dr. Byrd Hesselbach is an 77 y.o. female.   HPI: 77 year old woman found on the floor of the nursing home on 11/22/10 at about 4:30 AM by nursing staff. It is unclear how long she had been on the floor. The etiology of her fall was also unclear. She is transported to [facility] where she was found to have atrial fibrillation with rapid ventricular response. She was given a bolus of Cardizem and her heart rate subsequently became controlled. She was transferred back to the nursing home later that morning.    The patient was transported back to the emergency department (now at [facility]) on 11/22/10 and was again found to have atrial fibrillation with rapid ventricular response. Of note she had not been given her Cardizem at the nursing home today. Apparently she complained only of left cheek pain and some abdominal pain. She was again given IV Cardizem and is currently rate controlled with an infusion of 5 mg an hour. Because of her fall CT of the head and maxillofacial region was obtained which showed no evidence of fracture but did show a left-sided preseptal hematoma and possible hemorrhage in the maxillary sinus.  Patient was then transferred from [facility] to [facility].     PMHx:  Past Medical History  Diagnosis Date  . Alzheimer disease   . Atrial fibrillation   . Gastro - esophageal reflux disease   . Ischemic colitis, enteritis, or enterocolitis     PSx:  Past Surgical History  Procedure Date  . Unknown     Family Hx:  Family History  Problem Relation Age of Onset  . Diabetes Mother     Social Hx:  reports that she has never smoked. She has never used smokeless tobacco. She reports that she does not drink alcohol. Her drug history not on file.  Allergies: No Known Allergies  Medications: I have reviewed the patient's current medications.  Labs:  Results for orders placed  during the hospital encounter of 11/22/10 (from the past 48 hour(s))  GLUCOSE, CAPILLARY     Status: Normal   Collection Time   11/22/10  4:05 PM      Component Value Range Comment   Glucose-Capillary 74  70 - 99 (mg/dL)    Comment 1 Documented in Chart      Comment 2 Notify RN     CBC     Status: Normal   Collection Time   11/22/10  4:40 PM      Component Value Range Comment   WBC 7.6  4.0 - 10.5 (K/uL)    RBC 3.93  3.87 - 5.11 (MIL/uL)    Hemoglobin 12.2  12.0 - 15.0 (g/dL)    HCT 40.9  81.1 - 91.4 (%)    MCV 94.4  78.0 - 100.0 (fL)    MCH 31.0  26.0 - 34.0 (pg)    MCHC 32.9  30.0 - 36.0 (g/dL)    RDW 78.2  95.6 - 21.3 (%)    Platelets 373  150 - 400 (K/uL)   DIFFERENTIAL     Status: Normal   Collection Time   11/22/10  4:40 PM      Component Value Range Comment   Neutrophils Relative 64  43 - 77 (%)    Neutro Abs 4.9  1.7 - 7.7 (K/uL)    Lymphocytes Relative 27  12 - 46 (%)    Lymphs Abs 2.1  0.7 - 4.0 (K/uL)    Monocytes Relative 8  3 - 12 (%)    Monocytes Absolute 0.6  0.1 - 1.0 (K/uL)    Eosinophils Relative 1  0 - 5 (%)    Eosinophils Absolute 0.1  0.0 - 0.7 (K/uL)    Basophils Relative 1  0 - 1 (%)    Basophils Absolute 0.0  0.0 - 0.1 (K/uL)   BASIC METABOLIC PANEL     Status: Abnormal   Collection Time   11/22/10  4:40 PM      Component Value Range Comment   Sodium 140  135 - 145 (mEq/L)    Potassium 3.7  3.5 - 5.1 (mEq/L)    Chloride 103  96 - 112 (mEq/L)    CO2 29  19 - 32 (mEq/L)    Glucose, Bld 91  70 - 99 (mg/dL)    BUN 18  6 - 23 (mg/dL)    Creatinine, Ser 1.61  0.50 - 1.10 (mg/dL)    Calcium 9.4  8.4 - 10.5 (mg/dL)    GFR calc non Af Amer 76 (*) >90 (mL/min)    GFR calc Af Amer 88 (*) >90 (mL/min)   LIPASE, BLOOD     Status: Normal   Collection Time   11/22/10  4:40 PM      Component Value Range Comment   Lipase 27  11 - 59 (U/L)   APTT     Status: Normal   Collection Time   11/22/10  4:40 PM      Component Value Range Comment   aPTT 30  24 -  37 (seconds)   PROTIME-INR     Status: Normal   Collection Time   11/22/10  4:40 PM      Component Value Range Comment   Prothrombin Time 13.3  11.6 - 15.2 (seconds)    INR 0.99  0.00 - 1.49    MAGNESIUM     Status: Normal   Collection Time   11/22/10  4:40 PM      Component Value Range Comment   Magnesium 2.3  1.5 - 2.5 (mg/dL)   LACTIC ACID, PLASMA     Status: Normal   Collection Time   11/22/10  4:50 PM      Component Value Range Comment   Lactic Acid, Venous 1.4  0.5 - 2.2 (mmol/L)   POCT I-STAT TROPONIN I     Status: Normal   Collection Time   11/22/10  6:16 PM      Component Value Range Comment   Troponin i, poc 0.01  0.00 - 0.08 (ng/mL)    Comment 3            CBC     Status: Abnormal   Collection Time   11/23/10  5:00 AM      Component Value Range Comment   WBC 6.6  4.0 - 10.5 (K/uL)    RBC 3.17 (*) 3.87 - 5.11 (MIL/uL)    Hemoglobin 9.9 (*) 12.0 - 15.0 (g/dL)    HCT 09.6 (*) 04.5 - 46.0 (%)    MCV 94.6  78.0 - 100.0 (fL)    MCH 31.2  26.0 - 34.0 (pg)    MCHC 33.0  30.0 - 36.0 (g/dL)    RDW 40.9  81.1 - 91.4 (%)    Platelets 306  150 - 400 (K/uL)   BASIC METABOLIC PANEL     Status: Abnormal   Collection Time   11/23/10  5:00 AM  Component Value Range Comment   Sodium 142  135 - 145 (mEq/L)    Potassium 3.2 (*) 3.5 - 5.1 (mEq/L)    Chloride 108  96 - 112 (mEq/L)    CO2 28  19 - 32 (mEq/L)    Glucose, Bld 91  70 - 99 (mg/dL)    BUN 11  6 - 23 (mg/dL)    Creatinine, Ser 4.78  0.50 - 1.10 (mg/dL)    Calcium 8.6  8.4 - 10.5 (mg/dL)    GFR calc non Af Amer 77 (*) >90 (mL/min)    GFR calc Af Amer 89 (*) >90 (mL/min)   CARDIAC PANEL(CRET KIN+CKTOT+MB+TROPI)     Status: Normal   Collection Time   11/23/10  4:07 PM      Component Value Range Comment   Total CK 81  7 - 177 (U/L)    CK, MB 2.9  0.3 - 4.0 (ng/mL)    Troponin I <0.30  <0.30 (ng/mL)    Relative Index RELATIVE INDEX IS INVALID  0.0 - 2.5      Radiology: Ct Head Wo Contrast  11/22/2010   *RADIOLOGY REPORT*  Clinical Data:  Status post fall.  Left cheek hematoma.  CT HEAD WITHOUT CONTRAST CT MAXILLOFACIAL WITHOUT CONTRAST  Technique:  Multidetector CT imaging of the head and maxillofacial structures were performed using the standard protocol without intravenous contrast. Multiplanar CT image reconstructions of the maxillofacial structures were also generated.  Comparison:  06/07/2010  CT HEAD  Findings: There is diffuse patchy low density throughout the subcortical and periventricular white matter consistent with chronic small vessel ischemic change.  There is prominence of the sulci and ventricles consistent with brain atrophy.  There is no evidence for acute brain infarct, hemorrhage or mass.  There is a fluid level identified within the left maxillary sinus. The other paranasal sinuses are clear.  The mastoid air cells are clear.  The skull appears intact.  IMPRESSION:  1.  No acute intracranial abnormalities noted. 2.  Small vessel ischemic disease and brain atrophy.  CT MAXILLOFACIAL  Findings:   Left-sided preseptal hematoma is identified which measures approximately 0.7 cm in thickness.  There is a fluid level within the left maxillary sinus which may represent hemorrhage.  There is no evidence for orbital blowout fracture.  The zygomatic arches and mandible are intact.  The sinuses are intact.  IMPRESSION:  1.  Left-sided preseptal hematoma and fluid level within the left maxillary sinus. 2.  No evidence for facial bone fracture.  Original Report Authenticated By: Rosealee Albee, M.D.   Dg Chest Port 1 View  11/22/2010  *RADIOLOGY REPORT*  Clinical Data: Atrial fibrillation, status post fall  PORTABLE CHEST - 1 VIEW  Comparison: 06/07/2010  Findings: Cardiomegaly again noted.  No acute infiltrate or pleural effusion.  No pulmonary edema.  No gross fractures are identified. No diagnostic pneumothorax.  Stable degenerative changes bilateral acromioclavicular joints.  IMPRESSION: No active  disease.  Cardiomegaly again noted.  No diagnostic pneumothorax.  Original Report Authenticated By: Natasha Mead, M.D.   Ct Maxillofacial Wo Cm  11/22/2010  *RADIOLOGY REPORT*  Clinical Data:  Status post fall.  Left cheek hematoma.  CT HEAD WITHOUT CONTRAST CT MAXILLOFACIAL WITHOUT CONTRAST  Technique:  Multidetector CT imaging of the head and maxillofacial structures were performed using the standard protocol without intravenous contrast. Multiplanar CT image reconstructions of the maxillofacial structures were also generated.  Comparison:  06/07/2010  CT HEAD  Findings: There is diffuse patchy  low density throughout the subcortical and periventricular white matter consistent with chronic small vessel ischemic change.  There is prominence of the sulci and ventricles consistent with brain atrophy.  There is no evidence for acute brain infarct, hemorrhage or mass.  There is a fluid level identified within the left maxillary sinus. The other paranasal sinuses are clear.  The mastoid air cells are clear.  The skull appears intact.  IMPRESSION:  1.  No acute intracranial abnormalities noted. 2.  Small vessel ischemic disease and brain atrophy.  CT MAXILLOFACIAL  Findings:   Left-sided preseptal hematoma is identified which measures approximately 0.7 cm in thickness.  There is a fluid level within the left maxillary sinus which may represent hemorrhage.  There is no evidence for orbital blowout fracture.  The zygomatic arches and mandible are intact.  The sinuses are intact.  IMPRESSION:  1.  Left-sided preseptal hematoma and fluid level within the left maxillary sinus. 2.  No evidence for facial bone fracture.  Original Report Authenticated By: Rosealee Albee, M.D.    ZOX:[id number]: negative Eyes: negative Ears, nose, mouth, throat, and face: negative  Vital Signs: BP 108/53  Pulse 57  Temp(Src) 98 F (36.7 C) (Oral)  Resp 16  Ht 5\' 4"  (1.626 m)  Wt 52.028 kg (114 lb 11.2 oz)  BMI 19.69 kg/m2   SpO2 96%  Physical Exam: General appearance: alert, cooperative and appears stated age Head: Normocephalic, without obvious abnormality, atraumatic, Left facial hematoma (infraorbital region) Eyes: conjunctivae/corneas clear. PERRL, EOM's intact. Fundi benign., Exam limited due to patient cooperation Ears: normal TM's and external ear canals both ears Nose: Nares normal. Septum midline. Mucosa normal. No drainage or sinus tenderness. Throat: lips, mucosa, and tongue normal; teeth and gums normal  CN V, VII grossly intact, no step-offs, occlusion is stable and repeatable, maxilla and mandible are stable. No active bleeding from nares/patent.    Assessment/Plan:  Soft tissue hematoma left infraorbital region, fluid level in left maxillary sinus (no evidence of fracture). 1. Patient non-operative at this time. 2. Follow up only as needed.   [place],Yakelin Grenier L  11/24/2010, 10:39 AM

## 2011-06-12 ENCOUNTER — Encounter (HOSPITAL_COMMUNITY): Payer: Self-pay | Admitting: Neurology

## 2011-06-12 ENCOUNTER — Inpatient Hospital Stay (HOSPITAL_COMMUNITY)
Admission: EM | Admit: 2011-06-12 | Discharge: 2011-06-18 | DRG: 309 | Disposition: A | Discharge status: Skilled Nursing Facility | Payer: Medicare Other | Source: Ambulatory Visit | Attending: Internal Medicine | Admitting: Internal Medicine

## 2011-06-12 ENCOUNTER — Emergency Department (HOSPITAL_COMMUNITY): Payer: Medicare Other

## 2011-06-12 DIAGNOSIS — F0280 Dementia in other diseases classified elsewhere without behavioral disturbance: Secondary | ICD-10-CM | POA: Diagnosis present

## 2011-06-12 DIAGNOSIS — R296 Repeated falls: Secondary | ICD-10-CM | POA: Diagnosis present

## 2011-06-12 DIAGNOSIS — F0390 Unspecified dementia without behavioral disturbance: Secondary | ICD-10-CM

## 2011-06-12 DIAGNOSIS — E782 Mixed hyperlipidemia: Secondary | ICD-10-CM

## 2011-06-12 DIAGNOSIS — G309 Alzheimer's disease, unspecified: Secondary | ICD-10-CM | POA: Diagnosis present

## 2011-06-12 DIAGNOSIS — S4292XA Fracture of left shoulder girdle, part unspecified, initial encounter for closed fracture: Secondary | ICD-10-CM | POA: Diagnosis present

## 2011-06-12 DIAGNOSIS — S22009A Unspecified fracture of unspecified thoracic vertebra, initial encounter for closed fracture: Secondary | ICD-10-CM | POA: Diagnosis present

## 2011-06-12 DIAGNOSIS — N390 Urinary tract infection, site not specified: Secondary | ICD-10-CM | POA: Diagnosis present

## 2011-06-12 DIAGNOSIS — E876 Hypokalemia: Secondary | ICD-10-CM | POA: Diagnosis present

## 2011-06-12 DIAGNOSIS — S42209A Unspecified fracture of upper end of unspecified humerus, initial encounter for closed fracture: Secondary | ICD-10-CM | POA: Diagnosis present

## 2011-06-12 DIAGNOSIS — Y998 Other external cause status: Secondary | ICD-10-CM

## 2011-06-12 DIAGNOSIS — S42293A Other displaced fracture of upper end of unspecified humerus, initial encounter for closed fracture: Secondary | ICD-10-CM

## 2011-06-12 DIAGNOSIS — I4891 Unspecified atrial fibrillation: Secondary | ICD-10-CM

## 2011-06-12 DIAGNOSIS — Z66 Do not resuscitate: Secondary | ICD-10-CM | POA: Diagnosis present

## 2011-06-12 DIAGNOSIS — W19XXXA Unspecified fall, initial encounter: Secondary | ICD-10-CM

## 2011-06-12 LAB — DIFFERENTIAL
Basophils Absolute: 0 10*3/uL (ref 0.0–0.1)
Basophils Relative: 0 % (ref 0–1)
Eosinophils Absolute: 0.1 10*3/uL (ref 0.0–0.7)
Lymphocytes Relative: 21 % (ref 12–46)
Monocytes Absolute: 0.6 10*3/uL (ref 0.1–1.0)
Neutro Abs: 6.2 10*3/uL (ref 1.7–7.7)
Neutrophils Relative %: 71 % (ref 43–77)

## 2011-06-12 LAB — PROTIME-INR
INR: 0.99 (ref 0.00–1.49)
Prothrombin Time: 13.3 seconds (ref 11.6–15.2)

## 2011-06-12 LAB — URINALYSIS, ROUTINE W REFLEX MICROSCOPIC
Bilirubin Urine: NEGATIVE
Ketones, ur: 15 mg/dL — AB
Nitrite: NEGATIVE
Protein, ur: NEGATIVE mg/dL
Urobilinogen, UA: 0.2 mg/dL (ref 0.0–1.0)

## 2011-06-12 LAB — MRSA PCR SCREENING: MRSA by PCR: POSITIVE — AB

## 2011-06-12 LAB — URINE MICROSCOPIC-ADD ON

## 2011-06-12 LAB — CBC
HCT: 28.8 % — ABNORMAL LOW (ref 36.0–46.0)
Hemoglobin: 9.4 g/dL — ABNORMAL LOW (ref 12.0–15.0)
MCH: 29.7 pg (ref 26.0–34.0)
MCHC: 32.6 g/dL (ref 30.0–36.0)
RDW: 14.5 % (ref 11.5–15.5)

## 2011-06-12 LAB — BASIC METABOLIC PANEL
BUN: 21 mg/dL (ref 6–23)
Calcium: 9.2 mg/dL (ref 8.4–10.5)
GFR calc Af Amer: 90 mL/min — ABNORMAL LOW (ref >90)
GFR calc non Af Amer: 77 mL/min — ABNORMAL LOW (ref >90)
Glucose, Bld: 92 mg/dL (ref 70–99)

## 2011-06-12 MED ORDER — FERROUS SULFATE 300 (60 FE) MG/5ML PO SYRP
300.0000 mg | ORAL_SOLUTION | Freq: Every day | ORAL | Status: DC
  Administered 2011-06-12: 300 mg via ORAL
  Filled 2011-06-12 (×2): qty 5

## 2011-06-12 MED ORDER — SODIUM CHLORIDE 0.9 % IJ SOLN
3.0000 mL | Freq: Two times a day (BID) | INTRAMUSCULAR | Status: DC
  Administered 2011-06-12 – 2011-06-13 (×2): 3 mL via INTRAVENOUS

## 2011-06-12 MED ORDER — SODIUM CHLORIDE 0.9 % IV BOLUS (SEPSIS)
500.0000 mL | Freq: Once | INTRAVENOUS | Status: AC
  Administered 2011-06-12: 500 mL via INTRAVENOUS

## 2011-06-12 MED ORDER — ACETAMINOPHEN 325 MG PO TABS: 650.0000 mg | ORAL_TABLET | Freq: Four times a day (QID) | ORAL | Status: DC | PRN

## 2011-06-12 MED ORDER — FERROUS SULFATE 220 (44 FE) MG/5ML PO ELIX: 330.0000 mg | ORAL_SOLUTION | Freq: Every day | ORAL | Status: DC

## 2011-06-12 MED ORDER — ONDANSETRON HCL 4 MG PO TABS: 4.0000 mg | ORAL_TABLET | Freq: Four times a day (QID) | ORAL | Status: DC | PRN

## 2011-06-12 MED ORDER — DILTIAZEM HCL 100 MG IV SOLR
5.0000 mg/h | INTRAVENOUS | Status: AC
  Administered 2011-06-12: 15 mg/h via INTRAVENOUS
  Administered 2011-06-13: 10 mg/h via INTRAVENOUS
  Filled 2011-06-12: qty 100

## 2011-06-12 MED ORDER — SODIUM CHLORIDE 0.9 % IV SOLN
INTRAVENOUS | Status: DC
  Administered 2011-06-12: 23:00:00 via INTRAVENOUS

## 2011-06-12 MED ORDER — METOPROLOL TARTRATE 1 MG/ML IV SOLN
5.0000 mg | Freq: Four times a day (QID) | INTRAVENOUS | Status: DC | PRN
  Administered 2011-06-14: 5 mg via INTRAVENOUS
  Filled 2011-06-12: qty 5

## 2011-06-12 MED ORDER — DEXTROSE 5 % IV SOLN
1.0000 g | Freq: Once | INTRAVENOUS | Status: AC
  Administered 2011-06-12: 1 g via INTRAVENOUS
  Filled 2011-06-12: qty 10

## 2011-06-12 MED ORDER — DEXTROSE-NACL 5-0.45 % IV SOLN: INTRAVENOUS | Status: DC

## 2011-06-12 MED ORDER — OXYCODONE HCL 5 MG PO TABS
5.0000 mg | ORAL_TABLET | ORAL | Status: DC | PRN
  Administered 2011-06-14 – 2011-06-17 (×8): 5 mg via ORAL
  Filled 2011-06-12 (×8): qty 1

## 2011-06-12 MED ORDER — MORPHINE SULFATE 2 MG/ML IJ SOLN
1.0000 mg | INTRAMUSCULAR | Status: DC | PRN
  Administered 2011-06-16 – 2011-06-17 (×3): 1 mg via INTRAVENOUS
  Filled 2011-06-12 (×3): qty 1

## 2011-06-12 MED ORDER — TRAMADOL HCL 50 MG PO TABS
50.0000 mg | ORAL_TABLET | Freq: Four times a day (QID) | ORAL | Status: DC
  Administered 2011-06-12 – 2011-06-15 (×11): 50 mg via ORAL
  Filled 2011-06-12 (×14): qty 1

## 2011-06-12 MED ORDER — MEMANTINE HCL 5 MG PO TABS
5.0000 mg | ORAL_TABLET | Freq: Two times a day (BID) | ORAL | Status: DC
  Administered 2011-06-12 – 2011-06-18 (×12): 5 mg via ORAL
  Filled 2011-06-12 (×13): qty 1

## 2011-06-12 MED ORDER — FENTANYL CITRATE 0.05 MG/ML IJ SOLN
25.0000 ug | Freq: Once | INTRAMUSCULAR | Status: AC
  Administered 2011-06-12: 25 ug via INTRAVENOUS
  Filled 2011-06-12: qty 2

## 2011-06-12 MED ORDER — DILTIAZEM HCL 100 MG IV SOLR
5.0000 mg/h | Freq: Once | INTRAVENOUS | Status: AC
  Administered 2011-06-12: 5 mg/h via INTRAVENOUS

## 2011-06-12 MED ORDER — ONDANSETRON HCL 4 MG/2ML IJ SOLN: 4.0000 mg | Freq: Four times a day (QID) | INTRAMUSCULAR | Status: DC | PRN

## 2011-06-12 MED ORDER — DEXTROSE 5 % IV SOLN
1.0000 g | INTRAVENOUS | Status: DC
  Administered 2011-06-12 – 2011-06-15 (×4): 1 g via INTRAVENOUS
  Filled 2011-06-12 (×5): qty 10

## 2011-06-12 MED ORDER — DILTIAZEM HCL 25 MG/5ML IV SOLN
10.0000 mg | Freq: Once | INTRAVENOUS | Status: AC
  Administered 2011-06-12: 10 mg via INTRAVENOUS

## 2011-06-12 NOTE — ED Notes (Signed)
HR noted to have increased to 150-200, which was after the in and out cath. EDP aware. Reporting will start cardizem. About 5 mins later pt rate decreased to 120 AFIB.

## 2011-06-12 NOTE — ED Notes (Signed)
Per ems- Pt comes from [facility] of [employer]. Pt tripped and fell in hallway, landed on left shoulder. Pt has swelling to left shoulder. Pt has dementia, pt favoring left arm. Given 150 fentanyl, last at 1111, pt "help me help me". HR AFIB rate as high at 154. Pt answers questions yes or no, unsure whether she accurately answers. No blood thinners. Pt immobilized on LSB, C-collar. 166/91, HR 154, 94% 2 L

## 2011-06-12 NOTE — Progress Notes (Signed)
Orthopedic Tech Progress Note Patient Details:  Stacey Huang 10-Jan-1922 [id number]  Ortho Devices Type of Ortho Device: Other (comment) (Sling and Swathe) Ortho Device/Splint Location: (L) UE Ortho Device/Splint Interventions: Application   Asia R Thompson 06/12/2011, 3:05 PM

## 2011-06-12 NOTE — ED Provider Notes (Signed)
History     CSN: [id number]  Arrival date & time 06/12/11  1125   First MD Initiated Contact with Patient 06/12/11 1136      Chief Complaint  Patient presents with  . Fall    Patient is a 78 y.o. female presenting with fall. The history is provided by the EMS personnel.  Fall The accident occurred 3 to 5 hours ago. Incident: Unclear; fall was un-witnessed. Distance fallen: ground-level. She landed on a hard floor. There was no blood loss. The point of impact was the left shoulder. The pain is present in the left shoulder. The pain is moderate. She was not ambulatory at the scene. There was no entrapment after the fall. There was no drug use involved in the accident. There was no alcohol use involved in the accident. Associated symptoms include bowel incontinence (unchanged from baseline)). Pertinent negatives include no visual change (unchanged from baseline), no numbness, no abdominal pain, no vomiting, no headaches and no loss of consciousness. Treatment on scene includes a c-collar and a backboard. She has tried nothing for the symptoms.    Past Medical History  Diagnosis Date  . Alzheimer disease   . Atrial fibrillation   . Gastro - esophageal reflux disease   . Ischemic colitis, enteritis, or enterocolitis     Past Surgical History  Procedure Date  . Unknown   . Esophagogastroduodenoscopy 11/10/2010    Procedure: ESOPHAGOGASTRODUODENOSCOPY (EGD);  Surgeon: Theda Belfast;  Location: [facility];  Service: Endoscopy;  Laterality: N/A;  . Flexible sigmoidoscopy 11/10/2010    Procedure: FLEXIBLE SIGMOIDOSCOPY;  Surgeon: Theda Belfast;  Location: [facility];  Service: Gastroenterology;  Laterality: N/A;    Family History  Problem Relation Age of Onset  . Diabetes Mother     History  Substance Use Topics  . Smoking status: Never Smoker   . Smokeless tobacco: Never Used  . Alcohol Use: No    OB History    Grav Para Term Preterm Abortions TAB SAB Ect Mult Living              Review of Systems  Unable to perform ROS: Dementia  Gastrointestinal: Positive for bowel incontinence (unchanged from baseline)). Negative for vomiting and abdominal pain.  Neurological: Negative for loss of consciousness, numbness and headaches.    Allergies  Review of patient's allergies indicates no known allergies.  Home Medications   Current Outpatient Rx  Name Route Sig Dispense Refill  . ATENOLOL 25 MG PO TABS Oral Take 25 mg by mouth at bedtime.     Marland Kitchen CALCIUM CARBONATE-VITAMIN D 600-400 MG-UNIT PO TABS Oral Take 1 tablet by mouth daily.      Marland Kitchen VITAMIN D 1000 UNITS PO TABS Oral Take 1,000 Units by mouth daily.      Marland Kitchen CRANBERRY 500 MG PO CAPS Oral Take 1 capsule by mouth 2 (two) times daily.    Marland Kitchen DILTIAZEM HCL ER 120 MG PO CP24 Oral Take 120 mg by mouth daily.    Marland Kitchen FERROUS SULFATE 220 (44 FE) MG/5ML PO ELIX Oral Take 330 mg by mouth daily. At noon      . MEMANTINE HCL 5 MG PO TABS Oral Take 5 mg by mouth 2 (two) times daily.      Marland Kitchen POTASSIUM CHLORIDE ER 10 MEQ PO TBCR Oral Take 20 mEq by mouth daily.     . TRAMADOL HCL 50 MG PO TABS Oral Take 50 mg by mouth every 6 (six) hours as needed. For pain    .  ACETAMINOPHEN 325 MG PO TABS Oral Take 650 mg by mouth every 6 (six) hours as needed. For pain       There were no vitals taken for this visit.  Physical Exam  Nursing note and vitals reviewed. Constitutional: She appears well-developed and well-nourished.  HENT:  Head: Normocephalic and atraumatic.  Right Ear: External ear normal.  Left Ear: External ear normal.  Nose: Nose normal.  Mouth/Throat: Oropharynx is clear and moist. No oropharyngeal exudate.  Eyes: Conjunctivae are normal. Pupils are equal, round, and reactive to light.  Neck: Normal range of motion. Neck supple.  Cardiovascular: Normal rate, regular rhythm, normal heart sounds and intact distal pulses.  Exam reveals no gallop and no friction rub.   No murmur heard. Pulmonary/Chest: Effort normal  and breath sounds normal. No respiratory distress.  Abdominal: Soft. Bowel sounds are normal. She exhibits no distension and no mass. There is no tenderness. There is no rebound and no guarding.  Musculoskeletal:       Arms: Neurological:       Confused  Skin: Skin is warm and dry.    ED Course  Procedures (including critical care time)  Labs Reviewed  CBC - Abnormal; Notable for the following:    RBC 3.17 (*)    Hemoglobin 9.4 (*)    HCT 28.8 (*)    All other components within normal limits  BASIC METABOLIC PANEL - Abnormal; Notable for the following:    GFR calc non Af Amer 77 (*)    GFR calc Af Amer 90 (*)    All other components within normal limits  URINALYSIS, ROUTINE W REFLEX MICROSCOPIC - Abnormal; Notable for the following:    APPearance CLOUDY (*)    Hgb urine dipstick SMALL (*)    Ketones, ur 15 (*)    Leukocytes, UA LARGE (*)    All other components within normal limits  URINE MICROSCOPIC-ADD ON - Abnormal; Notable for the following:    Squamous Epithelial / LPF MANY (*)    Bacteria, UA MANY (*)    All other components within normal limits  PROTIME-INR  DIFFERENTIAL  LACTIC ACID, PLASMA   Dg Chest 2 View  06/12/2011  *RADIOLOGY REPORT*  Clinical Data: Fall  CHEST - 2 VIEW  Comparison: 11/22/2002  Findings: Enlargement of cardiac silhouette. Calcified tortuous aorta. Pulmonary vascular congestion. Chronic accentuation of interstitial markings. Mild right basilar atelectasis. No segmental consolidation, pleural effusion or pneumothorax. Underlying emphysematous changes. Bones appear diffusely demineralized with an interval compression fracture of a lower thoracic vertebra, approximately T11.  IMPRESSION: Enlargement of cardiac silhouette with pulmonary vascular congestion. Emphysematous and chronic interstitial lung disease changes. Mild right basilar atelectasis. Interval compression fracture of the T11 vertebral body.  Original Report Authenticated By: Lollie Marrow,  M.D.   Ct Head Wo Contrast  06/12/2011  *RADIOLOGY REPORT*  Clinical Data: Status post fall  CT HEAD WITHOUT CONTRAST,CT CERVICAL SPINE WITHOUT CONTRAST  Technique:  Contiguous axial images were obtained from the base of the skull through the vertex without contrast.,Technique: Multidetector CT imaging of the cervical spine was performed. Multiplanar CT image reconstructions were also generated.  Comparison: 11/22/2010  Findings: There is diffuse patchy low density throughout the subcortical and periventricular white matter consistent with chronic small vessel ischemic change.  There is prominence of the sulci and ventricles consistent with brain atrophy. There is no evidence for acute brain infarct, hemorrhage or mass. The paranasal sinuses and the mastoid air cells are clear.  The skull  appears intact.  IMPRESSION:  1.  No acute intracranial abnormalities. 2.  Small vessel ischemic change and brain atrophy.  CT CERVICAL SPINE WITHOUT CONTRAST  Technique:  Multidetector CT imaging of the cervical spine was performed without intravenous contrast.  Multiplanar CT image reconstructions were also generated.  Comparison:  None  Findings:  Normal alignment of the cervical spine.  Vertebral body heights are well preserved.  The facet joints are all aligned. Disc space narrowing and ventral spurring is noted.  Most severe at C5-6.  No fracture or subluxation.  Biapical scarring identified. There are low density nodules within both lobes of the thyroid gland.  The largest is in the left lobe measuring 0.9 cm.  IMPRESSION:  1.   No acute findings. 2.  Cervical spondylosis.  Original Report Authenticated By: Rosealee Albee, M.D.   Ct Cervical Spine Wo Contrast  06/12/2011  *RADIOLOGY REPORT*  Clinical Data: Status post fall  CT HEAD WITHOUT CONTRAST,CT CERVICAL SPINE WITHOUT CONTRAST  Technique:  Contiguous axial images were obtained from the base of the skull through the vertex without contrast.,Technique:  Multidetector CT imaging of the cervical spine was performed. Multiplanar CT image reconstructions were also generated.  Comparison: 11/22/2010  Findings: There is diffuse patchy low density throughout the subcortical and periventricular white matter consistent with chronic small vessel ischemic change.  There is prominence of the sulci and ventricles consistent with brain atrophy. There is no evidence for acute brain infarct, hemorrhage or mass. The paranasal sinuses and the mastoid air cells are clear.  The skull appears intact.  IMPRESSION:  1.  No acute intracranial abnormalities. 2.  Small vessel ischemic change and brain atrophy.  CT CERVICAL SPINE WITHOUT CONTRAST  Technique:  Multidetector CT imaging of the cervical spine was performed without intravenous contrast.  Multiplanar CT image reconstructions were also generated.  Comparison:  None  Findings:  Normal alignment of the cervical spine.  Vertebral body heights are well preserved.  The facet joints are all aligned. Disc space narrowing and ventral spurring is noted.  Most severe at C5-6.  No fracture or subluxation.  Biapical scarring identified. There are low density nodules within both lobes of the thyroid gland.  The largest is in the left lobe measuring 0.9 cm.  IMPRESSION:  1.   No acute findings. 2.  Cervical spondylosis.  Original Report Authenticated By: Rosealee Albee, M.D.   Dg Shoulder Left  06/12/2011  *RADIOLOGY REPORT*  Clinical Data: Left shoulder pain, fall  LEFT SHOULDER - 2+ VIEW  Comparison: None  Findings: Osseous demineralization. AC joint alignment normal. Comminuted displaced fracture involving the proximal left humerus at the surgical neck extending into the greater tuberosity. No definite dislocation identified. Visualized left ribs appear intact.  IMPRESSION: Comminuted proximal left humeral fracture.  Original Report Authenticated By: Lollie Marrow, M.D.     1. Urinary tract infection   2. Proximal humerus fracture     3. Atrial fibrillation with rapid ventricular response   4. Dementia   5. Fall      MDM  78 yo F w/hx of dementia and atrial fibrillation (on Diltiazem and Atenolol) presents after unwitnessed fall at [facility] nursing facility. Pt found on left shoulder, which is obviously deformed with edema. Appears to be NV intact distally. Exam difficulty secondary to pt's baseline dementia status. Pt also in Atrial Fibrillation with RVR; hx of A Fib (on Atenolol and Diltiazem for rate control). Not on anti-coagulation. Head CT negative for  evidence of intracranial bleed or skull fracture. Cervical spine CT negative for evidence of fracture or ligamentous injury. Imaging does, however, reveal comminuted left proximal humerus fracture. Patient remained with rapid ventricular response despite two IVF boluses; Diltiazem bolused and drip initiated. Internal Medicine [facility]) consulted and will admit.         Clemetine Marker, MD 06/12/11 1729

## 2011-06-12 NOTE — ED Provider Notes (Signed)
Pt presents with fall.  In ED tachycardic and minimally repsonsive however pt was given 150 mcg fentanyl en route.    Will check xrays, assess for dehydration, other acute medical conditions.  X-ray shows proximal humerus fracture.  Pt has persisted with a fib rvr intermittently despite IV fluid bolus.  Cardizem initiated.  Possible UTI which could be contributing factor.  BP has remained stable. Will admit to the hospital for IV hydration.  Humerus fracture can be treated with sling/swath.  I saw and evaluated the patient, reviewed the resident's note and I agree with the findings and plan.    Atrial fibrillation with rapid ventricular response, 176 Nonspecific ST and T wave abnormality likely secondary to repol abnormality Abnormal ECG flutter waves are no longer present as compared to ecg from 11/22/2010  Celene Kras, MD 06/13/11 0730

## 2011-06-12 NOTE — ED Notes (Signed)
Paged ortho to come down and apply sling

## 2011-06-12 NOTE — ED Notes (Signed)
[facility] called, spoke with Stacey Huang gave her update told pt likely being admitted

## 2011-06-12 NOTE — ED Notes (Signed)
Pt noted to have swelling to left shoulder, pt moving arm, was initially favoring left arm, noticed pt straightening arm out. Difficult to assess patient due to dementia. Will continue to monitor. Lung sounds clear. Pulses present. HR elevated 120's.

## 2011-06-12 NOTE — ED Notes (Signed)
Pt sleeping, not appearing in any pain. Sling applied to left arm. Pt started on cardizem due to rapid heart rate. Waiting for hospitalist to see patient. No family here at this time.

## 2011-06-12 NOTE — H&P (Signed)
PCP:   Florentina Jenny, MD, MD   Chief Complaint:  Rapid atrial fibrillation  HPI: Patient is an 78 year old white female with past mental history advanced dementia, recurrent UTIs in atrial fibrillation who today sustained a witnessed mechanical fall which led to her landing on her left shoulder. She was brought into the emergency room to be evaluated and it was noticed that she had an obvious deformity to the left shoulder which radiology confirmed a proximal humerus fracture. In addition,at that time during the workup was found have a large UTI and also to be in atrial fibrillation with rapid ventricular rate. The patient has had a previous history of recurrent UTIs which sometimes of cause rapid A. Fib.hospitals were called for admission. Patient was started on IV Cardizem drip and transported to the step down unit.  Review of Systems:  When I saw the patient in a step down unit, she received medication for pain. That helped her to the bathroom so upon return, her heart rate was in the 110s. The patient herself has chronic dementia and told me that she really had no complaints other than feeling fatigued. Denied any shortness of breath. Currently denied any shoulder pain. Review of systems otherwise negative.  Past Medical History: Past Medical History  Diagnosis Date  . Alzheimer disease   . Atrial fibrillation   . Gastro - esophageal reflux disease   . Ischemic colitis, enteritis, or enterocolitis    Past Surgical History  Procedure Date  . Unknown   . Esophagogastroduodenoscopy 11/10/2010    Procedure: ESOPHAGOGASTRODUODENOSCOPY (EGD);  Surgeon: Theda Belfast;  Location: [facility];  Service: Endoscopy;  Laterality: N/A;  . Flexible sigmoidoscopy 11/10/2010    Procedure: FLEXIBLE SIGMOIDOSCOPY;  Surgeon: Theda Belfast;  Location: [facility];  Service: Gastroenterology;  Laterality: N/A;    Medications: Prior to Admission medications   Medication Sig Start Date End Date Taking?  Authorizing Provider  atenolol (TENORMIN) 25 MG tablet Take 25 mg by mouth at bedtime.    Yes Historical Provider, MD  Calcium Carbonate-Vitamin D (CALCIUM 600+D) 600-400 MG-UNIT per tablet Take 1 tablet by mouth daily.     Yes Historical Provider, MD  cholecalciferol (VITAMIN D) 1000 UNITS tablet Take 1,000 Units by mouth daily.     Yes Historical Provider, MD  Cranberry 500 MG CAPS Take 1 capsule by mouth 2 (two) times daily.   Yes Historical Provider, MD  diltiazem (DILACOR XR) 120 MG 24 hr capsule Take 120 mg by mouth daily.   Yes Historical Provider, MD  ferrous sulfate 220 (44 FE) MG/5ML solution Take 330 mg by mouth daily. At noon     Yes Historical Provider, MD  memantine (NAMENDA) 5 MG tablet Take 5 mg by mouth 2 (two) times daily.     Yes Historical Provider, MD  potassium chloride (K-DUR) 10 MEQ tablet Take 20 mEq by mouth daily.  11/14/10  Yes Lesle Chris Black, NP  traMADol (ULTRAM) 50 MG tablet Take 50 mg by mouth every 6 (six) hours as needed. For pain   Yes Historical Provider, MD  acetaminophen (TYLENOL) 325 MG tablet Take 650 mg by mouth every 6 (six) hours as needed. For pain     Historical Provider, MD    Allergies:  No Known Allergies  Social History:  reports that she has never smoked. She has never used smokeless tobacco. She reports that she does not drink alcohol. Her drug history not on file. The patient is normally at baseline able to  ambulate some with assistance. She lives in a skilled nursing facility-Alzheimer[facility] memory unit.  Family History: Family History  Problem Relation Age of Onset  . Diabetes Mother     Physical Exam: Filed Vitals:   06/12/11 1730 06/12/11 1800 06/12/11 1900 06/12/11 1940  BP: 138/69 133/68 134/70   Pulse: 122 122 124   Temp:    98.3 F (36.8 C)  TempSrc:    Axillary  Resp: 11 11 14    Height:  5\' 3"  (1.6 m)    Weight:  56.1 kg (123 lb 10.9 oz)    SpO2: 97% 97% 96%    General: Alert and oriented x1, fatigued, looks about stated  age HEENT: Normocephalic, atraumatic, mucous membranes are dry Cardiovascular: Irregular rhythm, tachycardic Lungs: Clear to auscultation bilaterally Abdomen: Soft, nontender, nondistended, hypoactive bowel sounds Extremities: No clubbing or cyanosis or edema in the lower extremities. I deferred exam of her left arm which is in a sling.   Labs on Admission:   [facility] 06/12/11 1150  NA 139  K 4.0  CL 103  CO2 25  GLUCOSE 92  BUN 21  CREATININE 0.67  CALCIUM 9.2  MG --  PHOS --    Basename 06/12/11 1150  WBC 8.7  NEUTROABS 6.2  HGB 9.4*  HCT 28.8*  MCV 90.9  PLT 236    Radiological Exams on Admission: Dg Chest 2 View 06/12/2011  .  IMPRESSION: Enlargement of cardiac silhouette with pulmonary vascular congestion. Emphysematous and chronic interstitial lung disease changes. Mild right basilar atelectasis. Interval compression fracture of the T11 vertebral body.  Original Report Authenticated By: Lollie Marrow, M.D.   Ct Head Wo Contrast  06/12/2011   IMPRESSION:  1.  No acute intracranial abnormalities. 2.  Small vessel ischemic change and brain atrophy.    CT CERVICAL SPINE WITHOUT CONTRAST  IMPRESSION:  1.   No acute findings. 2.  Cervical spondylosis.  Original Report Authenticated By: Rosealee Albee, M.D.   Dg Shoulder Left  06/12/2011   IMPRESSION: Comminuted proximal left humeral fracture.  Original Report Authenticated By: Lollie Marrow, M.D.    Assessment/Plan Present on Admission:  .Atrial fibrillation with RVR: Have started in IV Cardizem plus if reach maximum dosing, added when necessary Lopressor. Once we've gotten better control of her pain, dehydration and urinary tract infection, imagine her heart rate should come down nicely and at that point we can transition her over to by mouth.  .Dementia: Monitor for sundowning. Try to avoid aggressive narcotic or sedation medications. Continue Namenda.  .Shoulder fracture, left:ER spoke to orthopedic surgery  recommended conservative management with sling. We'll have physical therapy evaluate. Pain control.  Marland KitchenUTI (lower urinary tract infection):watch for cultures. In the past, she's grown out pan-sensitive Escherichia coli. Will plan to go with Rocephin for now.  After discussion with the patient's son, he confirms that she is to be a DO NOT RESUSCITATE.  We will respect these wishes.  I anticipate her length of stay to be 3-4 days based on comorbidities, advanced age and multiple issues, unless something should change.  Time spent on this patient including examination and decision-making process: 45 minutes.  Hollice Espy [phone] 06/12/2011, 9:34 PM

## 2011-06-13 DIAGNOSIS — F0390 Unspecified dementia without behavioral disturbance: Secondary | ICD-10-CM

## 2011-06-13 DIAGNOSIS — S42293A Other displaced fracture of upper end of unspecified humerus, initial encounter for closed fracture: Secondary | ICD-10-CM

## 2011-06-13 DIAGNOSIS — I4891 Unspecified atrial fibrillation: Secondary | ICD-10-CM

## 2011-06-13 DIAGNOSIS — E782 Mixed hyperlipidemia: Secondary | ICD-10-CM

## 2011-06-13 LAB — BASIC METABOLIC PANEL
BUN: 9 mg/dL (ref 6–23)
CO2: 24 mEq/L (ref 19–32)
Chloride: 103 mEq/L (ref 96–112)
Creatinine, Ser: 0.51 mg/dL (ref 0.50–1.10)
GFR calc Af Amer: >90 mL/min (ref >90)
Potassium: 3.2 mEq/L — ABNORMAL LOW (ref 3.5–5.1)

## 2011-06-13 MED ORDER — POTASSIUM CHLORIDE CRYS ER 10 MEQ PO TBCR
30.0000 meq | EXTENDED_RELEASE_TABLET | Freq: Two times a day (BID) | ORAL | Status: DC
Start: 2011-06-13 — End: 2011-06-13
  Filled 2011-06-13 (×2): qty 1

## 2011-06-13 MED ORDER — POTASSIUM CHLORIDE 20 MEQ/15ML (10%) PO LIQD
30.0000 meq | Freq: Two times a day (BID) | ORAL | Status: DC
  Administered 2011-06-13: 16:00:00 via ORAL
  Administered 2011-06-14: 30 meq via ORAL
  Filled 2011-06-13 (×3): qty 30

## 2011-06-13 MED ORDER — CALCITONIN (SALMON) 200 UNIT/ACT NA SOLN
1.0000 | Freq: Every day | NASAL | Status: DC
  Administered 2011-06-13 – 2011-06-17 (×5): 1 via NASAL
  Filled 2011-06-13: qty 3.7

## 2011-06-13 MED ORDER — POTASSIUM CHLORIDE IN NACL 20-0.9 MEQ/L-% IV SOLN
INTRAVENOUS | Status: AC
  Administered 2011-06-13: 16:00:00 via INTRAVENOUS
  Filled 2011-06-13 (×2): qty 1000

## 2011-06-13 MED ORDER — MUPIROCIN 2 % EX OINT
1.0000 "application " | TOPICAL_OINTMENT | Freq: Two times a day (BID) | CUTANEOUS | Status: AC
  Administered 2011-06-13 – 2011-06-17 (×10): 1 via NASAL
  Filled 2011-06-13: qty 22

## 2011-06-13 MED ORDER — CHLORHEXIDINE GLUCONATE CLOTH 2 % EX PADS
6.0000 | MEDICATED_PAD | Freq: Every day | CUTANEOUS | Status: AC
  Administered 2011-06-13 – 2011-06-17 (×5): 6 via TOPICAL

## 2011-06-13 MED ORDER — POTASSIUM CHLORIDE 20 MEQ/15ML (10%) PO LIQD
30.0000 meq | Freq: Two times a day (BID) | ORAL | Status: DC
  Filled 2011-06-13: qty 22.5

## 2011-06-13 MED ORDER — DILTIAZEM HCL 30 MG PO TABS
30.0000 mg | ORAL_TABLET | Freq: Four times a day (QID) | ORAL | Status: DC
  Administered 2011-06-13 – 2011-06-14 (×4): 30 mg via ORAL
  Filled 2011-06-13 (×8): qty 1

## 2011-06-13 NOTE — Progress Notes (Signed)
[facility] [place] TEAM 1 - Stepdown/ICU TEAM  PCP:  Florentina Jenny, MD  Subjective: 78 year old female with history of advanced dementia, recurrent UTIs, and atrial fibrillation who sustained a witnessed mechanical fall which led to her landing on her left shoulder. She was brought into the emergency room to be evaluated and it was noticed that she had an obvious deformity to the left shoulder.  X-rays confirmed a proximal humerus fracture. In addition she was found have a UTI and also to be in atrial fibrillation with rapid ventricular rate.  The pt is resting comfortably at the time of my visit.  She awakens to my exam.  She denies any complaints, but her hx is of questionable reliability given her dementia.    Objective:  Intake/Output Summary (Last 24 hours) at 06/13/11 1337 Last data filed at 06/13/11 1300  Gross per 24 hour  Intake 1011.25 ml  Output    400 ml  Net 611.25 ml   Blood pressure 186/54, pulse 115, temperature 98.9 F (37.2 C), temperature source Axillary, resp. rate 21, height 5\' 3"  (1.6 m), weight 56.1 kg (123 lb 10.9 oz), SpO2 98.00%.  Physical Exam: General: No acute respiratory distress Lungs: Clear to auscultation bilaterally without wheezes or crackles Cardiovascular: irreg irreg - no gallup or rub - no appreciable M Abdomen: Nontender, nondistended, soft, bowel sounds positive, no rebound, no ascites, no appreciable mass Extremities: No significant cyanosis, clubbing, or edema bilateral lower extremities - L shoulder in sling w/ swath   Lab Results:  Basename 06/13/11 0500 06/12/11 1150  NA 137 139  K 3.2* 4.0  CL 103 103  CO2 24 25  GLUCOSE 110* 92  BUN 9 21  CREATININE 0.51 0.67  CALCIUM 8.7 9.2  MG -- --  PHOS -- --   Basename 06/12/11 1150  WBC 8.7  NEUTROABS 6.2  HGB 9.4*  HCT 28.8*  MCV 90.9  PLT 236   Micro Results: Recent Results (from the past 240 hour(s))  MRSA PCR SCREENING     Status: Abnormal   Collection Time   06/12/11  6:46 PM      Component Value Range Status Comment   MRSA by PCR POSITIVE (*) NEGATIVE Final    Studies/Results: All recent x-ray/radiology reports have been reviewed in detail.   Medications: I have reviewed the patient's complete medication list.  Assessment/Plan:  afib w/ rvr Rate remains variable - remains on IV cardizem gtt - will attempt to titrate to oral meds today now that pt has been hydrated  hypokalemia Goal is to keep K+ 4.0 or > - supplement and follow - check Mg  Alzheimer's dementia Appears to be at baseline  UTI On empiric abx - f/u culture   Comminuted displaced fracture involving the proximal left humerus at the surgical neck extending into the greater tuberosity No definite dislocation identified on x-ray - per admitting MD, "ER spoke to orthopedic surgery recommended conservative management with sling" - no evidence of discomfort at this time  MRSA screen + Remains on contact precautions  compression fracture of the T11 vertebral body Incidentally noted on CXR - begin calcitonin - follow clinically   DNR/No Code Blue  Dispo Remain on SDU until rate controlled and off cardizem gtt  Lonia Blood, MD [facility] Office  [phone] Pager [phone]  On-Call/Text Page:      [email or web address]      password [facility]

## 2011-06-13 NOTE — Care Management Note (Signed)
    Page 1 of 1   06/13/2011     8:42:07 AM   CARE MANAGEMENT NOTE 06/13/2011  Patient:  Stacey Huang, Stacey Huang   Account Number:  [id number]  Date Initiated:  06/13/2011  Documentation initiated by:  Junius Creamer  Subjective/Objective Assessment:   adm w uti, at fib     Action/Plan:   from cl[facility] bridge alf   Anticipated DC Date:     Anticipated DC Plan:    In-house referral  Clinical Social Worker      DC Planning Services  CM consult      Choice offered to / List presented to:             Status of service:   Medicare Important Message given?   (If response is "NO", the following Medicare IM given date fields will be blank) Date Medicare IM given:   Date Additional Medicare IM given:    Discharge Disposition:    Per UR Regulation:  Reviewed for med. necessity/level of care/duration of stay  If discussed at Long Length of Stay Meetings, dates discussed:    Comments:  6/12 8:41a debbie Amour Cutrone rn,bsn [phone]

## 2011-06-13 NOTE — Clinical Social Work Psychosocial (Signed)
     Clinical Social Work Department BRIEF PSYCHOSOCIAL ASSESSMENT 06/13/2011  Patient:  Stacey Huang, Stacey Huang     Account Number:  [id number]     Admit date:  06/12/2011  Clinical Social Worker:  Hulan Fray  Date/Time:  06/13/2011 11:23 AM  Referred by:  Care Management  Date Referred:  06/13/2011 Referred for  Other - See comment   Other Referral:   Admit from facility   Interview type:  Family Other interview type:   son, [name] [phone])    PSYCHOSOCIAL DATA Living Status:  FACILITY Admitted from facility:  [facility] Level of care:  Assisted Living Primary support name:  Ronnie Primary support relationship to patient:  CHILD, ADULT Degree of support available:   supportive    CURRENT CONCERNS Current Concerns  None Noted   Other Concerns:    SOCIAL WORK ASSESSMENT / PLAN Clinical Social Worker received referral for patient being admitted from [facility] [address] Patient was alseep and not easily arousable. CSW called patient's son, [name] to obtain collateral information. Per [name], patient is from the Memory care unit at [facility] and plan is for patient to return back once medically stable. Son reports that at facility, patient was ambulatory and did not use any assistive devices like a cane or walker. CSW informed son that PT will come to evaluate patient to make sure she can be accomodated at that facility. Per son, patient has had UTI's in the past and it took her a while to recover fully from that. CSW spoke with [facility] in admissions at [facility] facility and she reports that patient was ambulatory without any problems. The facility can provide home health PT/OT if needed for patient. Koisay request CSW to fax clinicals to the facility so that they can evaluate and make sure they can accept patient back at her condition at dischage. CSW will continue to follow and complete FL2 for MD's signature.   Assessment/plan status:   Psychosocial Support/Ongoing Assessment of Needs Other assessment/ plan:   Information/referral to community resources:   none at this time    PATIENTS/FAMILYS RESPONSE TO PLAN OF CARE: [name], patient's son stated plan is for patient to return back to [facility], but understands PT will evaluate and could recommend ST-SNF placement.

## 2011-06-13 NOTE — Progress Notes (Signed)
PT Cancellation Note  Treatment cancelled today due to medical issues with patient which prohibited therapy: hypotension with MAP 53-63 and low K+.  Will attempt later today if appropriate.  Stacey Huang 06/13/2011, 9:36 AM Pager [phone]

## 2011-06-14 DIAGNOSIS — I4891 Unspecified atrial fibrillation: Secondary | ICD-10-CM

## 2011-06-14 DIAGNOSIS — E782 Mixed hyperlipidemia: Secondary | ICD-10-CM

## 2011-06-14 DIAGNOSIS — S42293A Other displaced fracture of upper end of unspecified humerus, initial encounter for closed fracture: Secondary | ICD-10-CM

## 2011-06-14 DIAGNOSIS — F0390 Unspecified dementia without behavioral disturbance: Secondary | ICD-10-CM

## 2011-06-14 LAB — BASIC METABOLIC PANEL
CO2: 22 mEq/L (ref 19–32)
Chloride: 103 mEq/L (ref 96–112)
Glucose, Bld: 104 mg/dL — ABNORMAL HIGH (ref 70–99)
Sodium: 137 mEq/L (ref 135–145)

## 2011-06-14 LAB — CBC
HCT: 29.2 % — ABNORMAL LOW (ref 36.0–46.0)
Hemoglobin: 9.5 g/dL — ABNORMAL LOW (ref 12.0–15.0)
MCH: 29.3 pg (ref 26.0–34.0)
MCV: 90.1 fL (ref 78.0–100.0)
RBC: 3.24 MIL/uL — ABNORMAL LOW (ref 3.87–5.11)
WBC: 6.7 10*3/uL (ref 4.0–10.5)

## 2011-06-14 MED ORDER — PRO-STAT SUGAR FREE PO LIQD
30.0000 mL | Freq: Two times a day (BID) | ORAL | Status: DC
  Administered 2011-06-14 – 2011-06-18 (×4): 30 mL via ORAL
  Filled 2011-06-14 (×10): qty 30

## 2011-06-14 MED ORDER — ENSURE COMPLETE PO LIQD
237.0000 mL | Freq: Every day | ORAL | Status: DC
  Administered 2011-06-14 – 2011-06-18 (×5): 237 mL via ORAL

## 2011-06-14 MED ORDER — SODIUM CHLORIDE 0.9 % IV SOLN: INTRAVENOUS | Status: DC

## 2011-06-14 MED ORDER — DILTIAZEM HCL 60 MG PO TABS
60.0000 mg | ORAL_TABLET | Freq: Four times a day (QID) | ORAL | Status: DC
  Administered 2011-06-14 – 2011-06-18 (×16): 60 mg via ORAL
  Filled 2011-06-14 (×20): qty 1

## 2011-06-14 MED ORDER — CALCIUM CARBONATE-VITAMIN D 500-200 MG-UNIT PO TABS
1.0000 | ORAL_TABLET | Freq: Every day | ORAL | Status: DC
  Administered 2011-06-14 – 2011-06-18 (×5): 1 via ORAL
  Filled 2011-06-14 (×5): qty 1

## 2011-06-14 MED ORDER — CALCIUM CARBONATE-VITAMIN D 600-400 MG-UNIT PO TABS: 1.0000 | ORAL_TABLET | Freq: Every day | ORAL | Status: DC

## 2011-06-14 MED ORDER — POTASSIUM CHLORIDE 20 MEQ/15ML (10%) PO LIQD
30.0000 meq | Freq: Every day | ORAL | Status: DC
  Administered 2011-06-15: 30 meq via ORAL
  Filled 2011-06-14: qty 22.5

## 2011-06-14 MED ORDER — POTASSIUM CHLORIDE 20 MEQ/15ML (10%) PO LIQD: 20.0000 meq | Freq: Every day | ORAL | Status: DC

## 2011-06-14 NOTE — Evaluation (Signed)
Physical Therapy Evaluation Patient Details Name: Stacey Huang MRN: [id number] DOB: 02-18-1922 Today's Date: 06/14/2011 Time: 4098-1191 PT Time Calculation (min): 29 min  PT Assessment / Plan / Recommendation Clinical Impression  Pt is an 78 y/o female admitted s/p fall with resultant Left humerus fracture.  Pt participation in PT hindered by advanced dementia.  Suggesting SNF for discharge setting.  Acute PT to follow pt to promote mobility.     PT Assessment  Patient needs continued PT services    Follow Up Recommendations  Skilled nursing facility;Supervision/Assistance - 24 hour    Barriers to Discharge Other (comment) (Pt will required skilled care 24/7) Unclear if ALF has resources to provide skilled care 24/7    lEquipment Recommendations  Defer to next venue    Recommendations for Other Services OT consult   Frequency Min 2X/week    Precautions / Restrictions Precautions Precautions: Fall Required Braces or Orthoses: Other Brace/Splint Other Brace/Splint: shoulder immobilizer.   Restrictions Weight Bearing Restrictions: Yes LUE Weight Bearing:  (Not specified) Other Position/Activity Restrictions: Shoulder immobilizer on L UE.     Pertinent Vitals/Pain Pt displayed signs of pain in L UE unable to rate.        Mobility  Bed Mobility Bed Mobility: Left Sidelying to Sit;Sitting - Scoot to Edge of Bed Left Sidelying to Sit: 2: Max assist;HOB elevated Sitting - Scoot to Edge of Bed: 1: +1 Total assist Details for Bed Mobility Assistance: Pt unable to initate or follow commands to participate in scooting.  Pt in Left sidelying upon PT arrival.  Pt pushing off on Left UE to transition to sitting.  Manual facilitation to immobilze L UE to prevent pt from pushing on it.  Manual facilitation to raise pt's shoulders from bed.  Pt able to move bilateral LE into position of the bed with tactile cueing.  Transfers Transfers: Sit to Stand;Stand to Sit;Stand Pivot Transfers Sit  to Stand: 1: +1 Total assist;Without upper extremity assist;From bed;From elevated surface Stand to Sit: 1: +1 Total assist;To chair/3-in-1 Stand Pivot Transfers: 1: +2 Total assist Stand Pivot Transfers: Patient Percentage: 20% Details for Transfer Assistance: Manual facilitation to initate standing via anterior wt shift beyond pt's COG while blocking pt's feet. Tactile cueing through low back/pelvis to extend pt's hips. Supporting Pt's L UE while pt holding my arm with RUE. Manual facilitation to initiate pivotal steps to chair and contol pt descent to chair.   Ambulation/Gait Ambulation/Gait Assistance: Not tested (comment) Wheelchair Mobility Wheelchair Mobility: No    Exercises     PT Diagnosis: Generalized weakness;Acute pain;Difficulty walking;Altered mental status  PT Problem List: Decreased activity tolerance;Decreased strength;Decreased balance;Decreased mobility;Decreased knowledge of use of DME;Decreased cognition;Decreased safety awareness;Decreased knowledge of precautions;Cardiopulmonary status limiting activity;Pain PT Treatment Interventions: Gait training;Functional mobility training;Therapeutic activities;Neuromuscular re-education;Patient/family education   PT Goals Acute Rehab PT Goals PT Goal Formulation: Patient unable to participate in goal setting Time For Goal Achievement: 06/28/11 Potential to Achieve Goals: Fair Pt will Roll Supine to Right Side: with min assist PT Goal: Rolling Supine to Right Side - Progress: Goal set today Pt will go Supine/Side to Sit: with mod assist;with HOB 0 degrees PT Goal: Supine/Side to Sit - Progress: Goal set today Pt will Sit at Edge of Bed: with supervision;1-2 min;with unilateral upper extremity support PT Goal: Sit at Edge Of Bed - Progress: Goal set today Pt will go Sit to Supine/Side: with mod assist;with HOB 0 degrees PT Goal: Sit to Supine/Side - Progress: Goal set today Pt will  go Sit to Stand: with mod assist PT Goal:  Sit to Stand - Progress: Goal set today Pt will go Stand to Sit: with min assist PT Goal: Stand to Sit - Progress: Goal set today Pt will Transfer Bed to Chair/Chair to Bed: with mod assist PT Transfer Goal: Bed to Chair/Chair to Bed - Progress: Goal set today Pt will Ambulate: 1 - 15 feet;with mod assist;with cues (comment type and amount);with other equipment (comment) (HHA) PT Goal: Ambulate - Progress: Goal set today  Visit Information  Last PT Received On: 06/14/11    Subjective Data  Patient Stated Goal: pt unable to state goals due to Altered mental status.    Prior Functioning  Home Living Lives With: Other (Comment) Available Help at Discharge: Available 24 hours/day;Other (Comment) (ALF memory care unit.) Type of Home: Assisted living Prior Function Comments: Pt unable to provide home living and prior function history.  Communication Communication: Receptive difficulties;Expressive difficulties    Cognition  Overall Cognitive Status: History of cognitive impairments - at baseline Area of Impairment: Attention;Memory;Following commands;Safety/judgement;Awareness of errors Arousal/Alertness: Awake/alert Orientation Level: Disoriented X4 (Pt knows her first name. ) Behavior During Session: Restless (reaching for arm (pain)) Current Attention Level: Focused Following Commands: Follows one step commands inconsistently Safety/Judgement: Impulsive    Extremity/Trunk Assessment Right Upper Extremity Assessment RUE ROM/Strength/Tone: WFL for tasks assessed Left Upper Extremity Assessment LUE ROM/Strength/Tone: Unable to fully assess;Due to pain;Deficits Right Lower Extremity Assessment RLE ROM/Strength/Tone: Deficits RLE ROM/Strength/Tone Deficits: generalized weakness in bilateral LEs 3/5 grossly.  Left Lower Extremity Assessment LLE ROM/Strength/Tone: Deficits LLE ROM/Strength/Tone Deficits: generalized weakness in bilateral LEs 3/5 grossly Trunk Assessment Trunk  Assessment: Kyphotic   Balance Balance Balance Assessed: Yes Static Sitting Balance Static Sitting - Balance Support: Feet supported;Right upper extremity supported Static Sitting - Level of Assistance: 1: +1 Total assist Static Sitting - Comment/# of Minutes: Pt sat on EOB for 2-3 minutes while PT attempted secure her sholder immobilizer.  Pt falling to left ontire time and required constant tactile cueing  to maintain upright  position.  Cuein pt through anterior Rt hip (pushing hip down into the bed).  Pain was a likely factor in pt's inability to tolerate sitting.    End of Session PT - End of Session Equipment Utilized During Treatment: Gait belt;Other (comment) (Left shoulder immobilizer. ) Activity Tolerance: Patient limited by fatigue;Patient limited by pain Patient left: in chair;with call bell/phone within reach Nurse Communication: Mobility status;Other (comment);Precautions (Pt in chair with no chair alarm, )   Eutimio Gharibian 06/14/2011, 4:21 PM Tirrell Buchberger L. Davaun Quintela DPT [phone]

## 2011-06-14 NOTE — Progress Notes (Signed)
[facility] [place] TEAM 1 - Stepdown/ICU TEAM  PCP:  Florentina Jenny, MD  Subjective: 78 year old female with history of advanced dementia, recurrent UTIs, and atrial fibrillation who sustained a witnessed mechanical fall which led to her landing on her left shoulder. She was brought into the emergency room to be evaluated and it was noticed that she had an obvious deformity to the left shoulder.  X-rays confirmed a proximal humerus fracture. In addition she was found have a UTI and also to be in atrial fibrillation with rapid ventricular rate.  Per RN pt was resting comfortably until she was moved into the chair and is now in pain. She is confused and it is difficult to obtain a history.   Objective:  Intake/Output Summary (Last 24 hours) at 06/14/11 1318 Last data filed at 06/14/11 1100  Gross per 24 hour  Intake   1848 ml  Output      0 ml  Net   1848 ml   Blood pressure 149/67, pulse 116, temperature 98.2 F (36.8 C), temperature source Axillary, resp. rate 18, height 5\' 3"  (1.6 m), weight 56.1 kg (123 lb 10.9 oz), SpO2 100.00%.  Physical Exam: General: mod distress due to pain currently Lungs: Clear to auscultation bilaterally without wheezes or crackles Cardiovascular: irreg irreg - no gallup or rub - no appreciable M Abdomen: Nontender, nondistended, soft, bowel sounds positive, no rebound, no ascites, no appreciable mass Extremities: No significant cyanosis, clubbing, or edema bilateral lower extremities - L shoulder in sling w/ swath   Lab Results:  Basename 06/14/11 0530 06/13/11 0500 06/12/11 1150  NA 137 137 139  K 3.9 3.2* 4.0  CL 103 103 103  CO2 22 24 25   GLUCOSE 104* 110* 92  BUN 7 9 21   CREATININE 0.53 0.51 0.67  CALCIUM 8.7 8.7 9.2  MG 1.9 -- --  PHOS -- -- --    Basename 06/14/11 0530 06/12/11 1150  WBC 6.7 8.7  NEUTROABS -- 6.2  HGB 9.5* 9.4*  HCT 29.2* 28.8*  MCV 90.1 90.9  PLT 216 236   Micro Results: Recent Results (from the past 240  hour(s))  MRSA PCR SCREENING     Status: Abnormal   Collection Time   06/12/11  6:46 PM      Component Value Range Status Comment   MRSA by PCR POSITIVE (*) NEGATIVE Final    Studies/Results: All recent x-ray/radiology reports have been reviewed in detail.   Medications: I have reviewed the patient's complete medication list.  Assessment/Plan:  afib w/ rvr Rate has been over 100 today even when she was resting. Will be increasing her PO cardizem but placing holding parameters.   hypokalemia Goal is to keep K+ 4.0 or > - supplemented; Mg+ normal; Will cut Kcl back to 30 daily and f/u tomorrow.   Alzheimer's dementia Appears to be at baseline  UTI On empiric abx - appears culture was not sent but she is incontinent and I am hesitant to order an I and O cath. Should treat for 3 days empirically.   Comminuted displaced fracture involving the proximal left humerus at the surgical neck extending into the greater tuberosity No definite dislocation identified on x-ray - per admitting MD, "ER spoke to orthopedic surgery recommended conservative management with sling" - no evidence of discomfort at this time  MRSA screen + Remains on contact precautions  compression fracture of the T11 vertebral body Incidentally noted on CXR - begin calcitonin - follow clinically   DNR/No  Code Blue  Dispo Remain on SDU until rate controlled   Calvert Cantor, MD [phone]

## 2011-06-14 NOTE — Plan of Care (Signed)
Problem: Phase I Progression Outcomes Goal: Initial discharge plan identified Outcome: Completed/Met Date Met:  06/14/11 Plan for SNF  Problem: Phase II Progression Outcomes Goal: Tolerating diet Outcome: Completed/Met Date Met:  06/14/11 Eats greater than 50% each meal.

## 2011-06-14 NOTE — Progress Notes (Signed)
INITIAL ADULT NUTRITION ASSESSMENT Date: 06/14/2011   Time: 9:45 AM Reason for Assessment: Low Braden score  ASSESSMENT: Female 78 y.o.  Dx: Atrial fibrillation with RVR  Hx:  Past Medical History  Diagnosis Date  . Alzheimer disease   . Atrial fibrillation   . Gastro - esophageal reflux disease   . Ischemic colitis, enteritis, or enterocolitis     Related Meds:     . calcitonin (salmon)  1 spray Alternating Nares Daily  . cefTRIAXone (ROCEPHIN)  IV  1 g Intravenous Q24H  . Chlorhexidine Gluconate Cloth  6 each Topical Q0600  . diltiazem  30 mg Oral Q6H  . memantine  5 mg Oral BID  . mupirocin ointment  1 application Nasal BID  . potassium chloride  30 mEq Oral BID  . traMADol  50 mg Oral Q6H  . DISCONTD: dextrose 5 % and 0.45% NaCl   Intravenous STAT  . DISCONTD: ferrous sulfate  300 mg Oral Q breakfast  . DISCONTD: potassium chloride  30 mEq Oral BID  . DISCONTD: potassium chloride  30 mEq Oral BID  . DISCONTD: sodium chloride  3 mL Intravenous Q12H     Ht: 5\' 3"  (160 cm)  Wt: 123 lb 10.9 oz (56.1 kg)  Ideal Wt: 52.4 kg  % Ideal Wt: 107%  Usual Wt:  Wt Readings from Last 10 Encounters:  06/12/11 123 lb 10.9 oz (56.1 kg)  11/23/10 114 lb 11.2 oz (52.028 kg)  11/11/10 115 lb 8.3 oz (52.4 kg)  11/11/10 115 lb 8.3 oz (52.4 kg)  11/11/10 115 lb 8.3 oz (52.4 kg)    % Usual Wt: 107%  Body mass index is 21.91 kg/(m^2). WNL  Food/Nutrition Related Hx: No problems indicated per nutrition risk report. Pt stated she has no appetite, denies any weight loss. Unsure the reliability of this information r/t pt's dementia. No other family in room at time of visit.   Labs:  CMP     Component Value Date/Time   NA 137 06/14/2011 0530   K 3.9 06/14/2011 0530   CL 103 06/14/2011 0530   CO2 22 06/14/2011 0530   GLUCOSE 104* 06/14/2011 0530   BUN 7 06/14/2011 0530   CREATININE 0.53 06/14/2011 0530   CALCIUM 8.7 06/14/2011 0530   PROT 8.1 11/09/2010 1731   ALBUMIN 3.9 11/09/2010  1731   AST 33 11/09/2010 1731   ALT 22 11/09/2010 1731   ALKPHOS 89 11/09/2010 1731   BILITOT 0.7 11/09/2010 1731   GFRNONAA 84* 06/14/2011 0530   GFRAA >90 06/14/2011 0530     Intake/Output Summary (Last 24 hours) at 06/14/11 0947 Last data filed at 06/14/11 0900  Gross per 24 hour  Intake   1838 ml  Output      0 ml  Net   1838 ml     Diet Order: Cardiac  Supplements/Tube Feeding: none  IVF:    sodium chloride   0.9 % NaCl with KCl 20 mEq / L Last Rate: 75 mL/hr at 06/13/11 1614  diltiazem (CARDIZEM) infusion Last Rate: 5 mg/hr (06/13/11 0500)  DISCONTD: sodium chloride Last Rate: 10 mL/hr at 06/12/11 2300    Estimated Nutritional Needs:   Kcal: 1275-1450 Protein: 55-65 gm  Fluid:  > 1.5 L  RD pulled to pt with low Braden score. Per RN pt was living in an assisted living at time of her fall, was independently mobile. Pt does not have any pressure ulcers at this time, but is at increased risk for  skin breakdown.  Pt did answer yes to drinking Boost at her facility, RD will add this while here to help with skin integrity. PO intake ranging from 10-75%, likely is not meeting her protein needs, will add Pro-stat BID as well.   NUTRITION DIAGNOSIS: -Inadequate protein intake (NI-5.7.1).  Status: Ongoing  RELATED TO: increased protein needs and poor PO intake  AS EVIDENCE BY: 10-75% meal completion   MONITORING/EVALUATION(Goals): Goal: PO intake of meals and supplements will be adequate to maintain skin integrity  Monitor: PO intake, weight, labs, I/O's  EDUCATION NEEDS: -No education needs identified at this time  INTERVENTION: 1. Add Ensure Complete daily 2. Add 30 ml Pro-stat BID with lunch and dinner 3. RD will continue to follow  Dietitian [phone]  DOCUMENTATION CODES Per approved criteria  -Not Applicable    Clarene Duke MARIE 06/14/2011, 9:45 AM

## 2011-06-15 DIAGNOSIS — I4891 Unspecified atrial fibrillation: Secondary | ICD-10-CM

## 2011-06-15 DIAGNOSIS — F0390 Unspecified dementia without behavioral disturbance: Secondary | ICD-10-CM

## 2011-06-15 DIAGNOSIS — E782 Mixed hyperlipidemia: Secondary | ICD-10-CM

## 2011-06-15 DIAGNOSIS — S42293A Other displaced fracture of upper end of unspecified humerus, initial encounter for closed fracture: Secondary | ICD-10-CM

## 2011-06-15 MED ORDER — HYDROCODONE-ACETAMINOPHEN 5-325 MG PO TABS: 1.0000 | ORAL_TABLET | ORAL | Status: DC | PRN

## 2011-06-15 MED ORDER — METOPROLOL TARTRATE 25 MG PO TABS
25.0000 mg | ORAL_TABLET | Freq: Two times a day (BID) | ORAL | Status: DC
  Administered 2011-06-15: 25 mg via ORAL
  Filled 2011-06-15 (×3): qty 1

## 2011-06-15 MED ORDER — ACETAMINOPHEN 325 MG PO TABS: 650.0000 mg | ORAL_TABLET | Freq: Four times a day (QID) | ORAL | Status: DC | PRN

## 2011-06-15 MED ORDER — POTASSIUM CHLORIDE 20 MEQ/15ML (10%) PO LIQD
20.0000 meq | Freq: Every day | ORAL | Status: DC
  Administered 2011-06-16 – 2011-06-18 (×3): 20 meq via ORAL
  Filled 2011-06-15 (×3): qty 15

## 2011-06-15 NOTE — Progress Notes (Signed)
[facility] [place] TEAM 1 - Stepdown/ICU TEAM  PCP:  Florentina Jenny, MD  Subjective: 78 year old female with history of advanced dementia, recurrent UTIs, and atrial fibrillation who sustained a witnessed mechanical fall which led to her landing on her left shoulder. She was brought into the emergency room to be evaluated and it was noticed that she had an obvious deformity to the left shoulder.  X-rays confirmed a proximal humerus fracture. In addition she was found have a UTI and also to be in atrial fibrillation with rapid ventricular rate.  The pt is resting comfortably in a bedside chair at the time of my visit.  She awakens to my exam.  She recoils from my touch, and will not answer any questions.    Objective:  Intake/Output Summary (Last 24 hours) at 06/15/11 1333 Last data filed at 06/15/11 1153  Gross per 24 hour  Intake    648 ml  Output    200 ml  Net    448 ml   Blood pressure 126/56, pulse 98, temperature 97.8 F (36.6 C), temperature source Axillary, resp. rate 13, height 5\' 3"  (1.6 m), weight 56.1 kg (123 lb 10.9 oz), SpO2 97.00%.  Physical Exam: General: No acute respiratory distress Lungs: Clear to auscultation bilaterally without wheezes or crackles Cardiovascular: irreg irreg - no gallup or rub - no appreciable M Abdomen: Nontender, nondistended, soft, bowel sounds positive, no rebound, no ascites, no appreciable mass Extremities: No significant cyanosis, clubbing, or edema bilateral lower extremities - L shoulder in sling  Lab Results:  Basename 06/14/11 0530 06/13/11 0500  NA 137 137  K 3.9 3.2*  CL 103 103  CO2 22 24  GLUCOSE 104* 110*  BUN 7 9  CREATININE 0.53 0.51  CALCIUM 8.7 8.7  MG 1.9 --  PHOS -- --    Basename 06/14/11 0530  WBC 6.7  NEUTROABS --  HGB 9.5*  HCT 29.2*  MCV 90.1  PLT 216   Micro Results: Recent Results (from the past 240 hour(s))  MRSA PCR SCREENING     Status: Abnormal   Collection Time   06/12/11  6:46 PM        Component Value Range Status Comment   MRSA by PCR POSITIVE (*) NEGATIVE Final    Studies/Results: All recent x-ray/radiology reports have been reviewed in detail.   Medications: I have reviewed the patient's complete medication list.  Assessment/Plan:  afib w/ rvr Rate still remains variable - will attempt to further titrate oral meds - watch for rebound bradycardia  hypokalemia Goal is to keep K+ 4.0 or > - supplement and follow - Mg normal - K+ improving nicely  Alzheimer's dementia Appears to be at baseline  UTI On empiric abx - appears culture was not sent - will complete an empiric 7 day course given advanced age and complicating factors  Comminuted displaced fracture involving the proximal left humerus at the surgical neck extending into the greater tuberosity No definite dislocation identified on x-ray - per admitting MD, "ER spoke to orthopedic surgery recommended conservative management with sling" - no evidence of discomfort at this time  MRSA screen + Remains on contact precautions  compression fracture of the T11 vertebral body Incidentally noted on CXR - begin calcitonin - follow clinically   DNR/No Code Blue  Dispo Remain in SDU until HR more reliably controlled  Lonia Blood, MD [facility] Office  [phone] Pager [phone]  On-Call/Text Page:      [email or web address]  [employer] [facility] [facility]

## 2011-06-15 NOTE — Progress Notes (Addendum)
Clinical Social Worker faxed [facility] PT clinicals to determine if they are able to accommodate the needed care for patient to return back. CSW called son, [name] to inform him of the recommendation of SNF from PT and the contact made to the facility about whether or not they are able to accommodate the needs of the patient. CSW will continue to follow.   10:01am CSW received call from [facility] in admissions at [facility], she stated that they will be able to accommodate the patient's needs and care. CSW will continue to send clinicals from patient's progress with PT to facility. Once patient is medically ready the facility will need orders for home health PT/OT. They use their own agency [facility]. CSW will continue to follow.  10:42 CSW received call from Modena Nunnery, Health and Wellness Director for the ALF (main number [phone] or cell [phone]) and she stated that they do have skilled therapy at the facility and able to take patient back. Dotti stated that she is working over the weekend and will be available for patient to return, if medically stable over weekend. The best number to reach her would be her cell phone if being discharged over the weekend. CSW will continue to follow.  Rozetta Nunnery MSW, [employer] [phone]

## 2011-06-16 ENCOUNTER — Inpatient Hospital Stay (HOSPITAL_COMMUNITY): Payer: Medicare Other

## 2011-06-16 DIAGNOSIS — S42293A Other displaced fracture of upper end of unspecified humerus, initial encounter for closed fracture: Secondary | ICD-10-CM

## 2011-06-16 DIAGNOSIS — F0390 Unspecified dementia without behavioral disturbance: Secondary | ICD-10-CM

## 2011-06-16 DIAGNOSIS — E782 Mixed hyperlipidemia: Secondary | ICD-10-CM

## 2011-06-16 DIAGNOSIS — I4891 Unspecified atrial fibrillation: Secondary | ICD-10-CM

## 2011-06-16 MED ORDER — ACETAMINOPHEN 325 MG PO TABS: 650.0000 mg | ORAL_TABLET | Freq: Four times a day (QID) | ORAL | Status: DC | PRN

## 2011-06-16 MED ORDER — LEVOFLOXACIN 750 MG PO TABS
750.0000 mg | ORAL_TABLET | Freq: Every day | ORAL | Status: AC
  Administered 2011-06-16 – 2011-06-18 (×3): 750 mg via ORAL
  Filled 2011-06-16 (×3): qty 1

## 2011-06-16 MED ORDER — METOPROLOL TARTRATE 50 MG PO TABS
50.0000 mg | ORAL_TABLET | Freq: Two times a day (BID) | ORAL | Status: DC
  Administered 2011-06-16 (×2): 50 mg via ORAL
  Filled 2011-06-16 (×4): qty 1

## 2011-06-16 MED ORDER — ASPIRIN 325 MG PO TABS
325.0000 mg | ORAL_TABLET | Freq: Every day | ORAL | Status: DC
  Administered 2011-06-16 – 2011-06-18 (×3): 325 mg via ORAL
  Filled 2011-06-16 (×3): qty 1

## 2011-06-16 NOTE — Progress Notes (Signed)
[facility] [place] TEAM 1 - Stepdown/ICU TEAM  PCP:  Florentina Jenny, MD  Subjective: 78 year old female with history of advanced dementia, recurrent UTIs, and atrial fibrillation who sustained a witnessed mechanical fall which led to her landing on her left shoulder. She was brought into the emergency room to be evaluated and it was noticed that she had an obvious deformity to the left shoulder.  X-rays confirmed a proximal humerus fracture. In addition she was found have a UTI and also to be in atrial fibrillation with rapid ventricular rate.  The pt is asleep in bed this morning.  She does not awaken during my exam.  She does not appear to be in any distress.  There is no family in the room at the time of my visit.    Objective:  Intake/Output Summary (Last 24 hours) at 06/16/11 0931 Last data filed at 06/15/11 2303  Gross per 24 hour  Intake    530 ml  Output      0 ml  Net    530 ml   Blood pressure 123/51, pulse 94, temperature 98.7 F (37.1 C), temperature source Axillary, resp. rate 25, height 5\' 3"  (1.6 m), weight 56.1 kg (123 lb 10.9 oz), SpO2 97.00%.  Physical Exam: General: No acute respiratory distress Lungs: Clear to auscultation bilaterally without wheezes or crackles Cardiovascular: irreg irreg - no gallup or rub - no appreciable M Abdomen:  NT/ND, soft, bowel sounds positive, no rebound, no ascites, no appreciable mass Extremities: No significant cyanosis, clubbing, or edema bilateral lower extremities - L shoulder in sling  Lab Results:  Basename 06/14/11 0530  NA 137  K 3.9  CL 103  CO2 22  GLUCOSE 104*  BUN 7  CREATININE 0.53  CALCIUM 8.7  MG 1.9  PHOS --    Basename 06/14/11 0530  WBC 6.7  NEUTROABS --  HGB 9.5*  HCT 29.2*  MCV 90.1  PLT 216   Micro Results: Recent Results (from the past 240 hour(s))  MRSA PCR SCREENING     Status: Abnormal   Collection Time   06/12/11  6:46 PM      Component Value Range Status Comment   MRSA by  PCR POSITIVE (*) NEGATIVE Final    Studies/Results: All recent x-ray/radiology reports have been reviewed in detail.   Medications: I have reviewed the patient's complete medication list.  Assessment/Plan:  afib w/ rvr Rate appears to be improving at this time - will likely require further titration of meds - cont to follow on tele - not an appropriate candidate for anticoag given advance dementia and attendant high fall risk  hypokalemia Goal is to keep K+ 4.0 or > - supplement and follow - Mg normal - K+ improving nicely  Alzheimer's dementia Appears to be at baseline  UTI On empiric abx - appears culture was not sent - will complete an empiric 7 day course given advanced age and complicating factors - will transition to oral meds  Comminuted displaced fracture involving the proximal left humerus at the surgical neck extending into the greater tuberosity No definite dislocation identified on x-ray - per admitting MD, "ER spoke to orthopedic surgery recommended conservative management with sling" - no evidence of discomfort at this time - will obtain f/u x-ray before d/c - pt will need to be seen in Ortho office in f/u after d/c   MRSA screen + Remains on contact precautions  compression fracture of the T11 vertebral body Incidentally noted on CXR -  begin calcitonin - follow clinically   DNR/No Code Blue  Dispo Stable for transfer to tele bed today - hopeful for transfer back to SNF on Monday  Lonia Blood, MD [facility] Office  [phone] Pager [phone]  On-Call/Text Page:      [email or web address]      password [facility]

## 2011-06-16 NOTE — Plan of Care (Signed)
Problem: Phase II Progression Outcomes Goal: Ventricular heart rate < 100/min Outcome: Progressing Hr increases w/pain and or anxiety

## 2011-06-17 DIAGNOSIS — S42293A Other displaced fracture of upper end of unspecified humerus, initial encounter for closed fracture: Secondary | ICD-10-CM

## 2011-06-17 DIAGNOSIS — I4891 Unspecified atrial fibrillation: Secondary | ICD-10-CM

## 2011-06-17 DIAGNOSIS — F0390 Unspecified dementia without behavioral disturbance: Secondary | ICD-10-CM

## 2011-06-17 DIAGNOSIS — E782 Mixed hyperlipidemia: Secondary | ICD-10-CM

## 2011-06-17 LAB — BASIC METABOLIC PANEL
BUN: 23 mg/dL (ref 6–23)
Chloride: 101 mEq/L (ref 96–112)
GFR calc Af Amer: 86 mL/min — ABNORMAL LOW (ref >90)
Potassium: 4.2 mEq/L (ref 3.5–5.1)

## 2011-06-17 MED ORDER — METOPROLOL TARTRATE 25 MG PO TABS
25.0000 mg | ORAL_TABLET | Freq: Two times a day (BID) | ORAL | Status: DC
  Administered 2011-06-17 – 2011-06-18 (×3): 25 mg via ORAL
  Filled 2011-06-17 (×2): qty 1

## 2011-06-17 NOTE — Progress Notes (Addendum)
[facility] [place] TEAM 1 - Stepdown/ICU TEAM  PCP:  Florentina Jenny, MD  Subjective: 78 year old female with history of advanced dementia, recurrent UTIs, and atrial fibrillation who sustained a witnessed mechanical fall which led to her landing on her left shoulder. She was brought into the emergency room to be evaluated and it was noticed that she had an obvious deformity to the left shoulder.  X-rays confirmed a proximal humerus fracture. In addition she was found have a UTI and also to be in atrial fibrillation with rapid ventricular rate.  The pt is resting comfortably in bed this morning.  She is awake, but non-communicative.  She is in a sling, but despite orders she is till not in a swath.    Objective:  Intake/Output Summary (Last 24 hours) at 06/17/11 0959 Last data filed at 06/17/11 0800  Gross per 24 hour  Intake    510 ml  Output      0 ml  Net    510 ml   Blood pressure 148/38, pulse 47, temperature 97.4 F (36.3 C), temperature source Axillary, resp. rate 28, height 5\' 3"  (1.6 m), weight 51.4 kg (113 lb 5.1 oz), SpO2 98.00%.  Physical Exam: General: No acute respiratory distress Lungs: Clear to auscultation bilaterally without wheezes or crackles Cardiovascular: irreg irreg - no gallup or rub - no appreciable M Abdomen:  NT/ND, soft, bowel sounds positive, no rebound, no ascites, no appreciable mass Extremities: No significant cyanosis, clubbing, or edema bilateral lower extremities - L shoulder in sling but not in swath (as ordered) - some bruising at the shoulder, but no tenting of skin, erythema, or induration  Lab Results:  [facility] 06/17/11 0525  NA 137  K 4.2  CL 101  CO2 27  GLUCOSE 111*  BUN 23  CREATININE 0.75  CALCIUM 9.6  MG --  PHOS --   No results found for this basename: WBC:3,NEUTROABS:3,HGB:3,HCT:3,MCV:3,PLT:3 in the last 72 hours  Micro Results: Recent Results (from the past 240 hour(s))  MRSA PCR SCREENING     Status: Abnormal     Collection Time   06/12/11  6:46 PM      Component Value Range Status Comment   MRSA by PCR POSITIVE (*) NEGATIVE Final    Studies/Results: All recent x-ray/radiology reports have been reviewed in detail.   Medications: I have reviewed the patient's complete medication list.  Assessment/Plan:  afib w/ rvr Rate appears to be improving with further titration of meds, but has had some brady - will decrease BB and follow on tele - cont to follow on tele - not an appropriate candidate for anticoag given advance dementia and attendant high fall risk  hypokalemia Goal is to keep K+ 4.0 or > - supplemented - Mg normal - K+ now at goal  Alzheimer's dementia Appears to be at baseline  UTI On empiric abx - appears culture was not sent - will complete an empiric 7 day course given advanced age and complicating factors - will transition to oral meds  Comminuted displaced fracture involving the proximal left humerus at the surgical neck extending into the greater tuberosity No definite dislocation identified on x-ray - f/u x-ray suggests no significant change - per admitting MD, "ER spoke to orthopedic surgery recommended conservative management with sling" - no evidence of discomfort at this time - will obtain f/u x-ray before d/c - pt will need to be seen in Ortho office in f/u after d/c - placed another order to clarify that pt  MUST be in sling AND swath to immobilize the arm  MRSA screen + Remains on contact precautions  compression fracture of the T11 vertebral body Incidentally noted on CXR - begin calcitonin - follow clinically   DNR/No Code Blue  Dispo Stable for transfer to tele bed today - hopeful for transfer back to SNF on Monday  Lonia Blood, MD [facility] Office  [phone] Pager [phone]  On-Call/Text Page:      [email or web address]      password [email or web address]

## 2011-06-18 DIAGNOSIS — E782 Mixed hyperlipidemia: Secondary | ICD-10-CM

## 2011-06-18 DIAGNOSIS — F0390 Unspecified dementia without behavioral disturbance: Secondary | ICD-10-CM

## 2011-06-18 DIAGNOSIS — S42293A Other displaced fracture of upper end of unspecified humerus, initial encounter for closed fracture: Secondary | ICD-10-CM

## 2011-06-18 DIAGNOSIS — I4891 Unspecified atrial fibrillation: Secondary | ICD-10-CM

## 2011-06-18 LAB — BASIC METABOLIC PANEL
CO2: 28 mEq/L (ref 19–32)
Chloride: 97 mEq/L (ref 96–112)
Sodium: 134 mEq/L — ABNORMAL LOW (ref 135–145)

## 2011-06-18 MED ORDER — METOPROLOL TARTRATE 25 MG PO TABS: 25.0000 mg | ORAL_TABLET | Freq: Two times a day (BID) | ORAL | Status: DC

## 2011-06-18 MED ORDER — CALCITONIN (SALMON) 200 UNIT/ACT NA SOLN: 1.0000 | Freq: Every day | NASAL | Status: DC

## 2011-06-18 MED ORDER — ASPIRIN 325 MG PO TABS: 325.0000 mg | ORAL_TABLET | Freq: Every day | ORAL | Status: DC

## 2011-06-18 MED ORDER — PRO-STAT SUGAR FREE PO LIQD: 30.0000 mL | Freq: Two times a day (BID) | ORAL | Status: DC

## 2011-06-18 MED ORDER — DILTIAZEM HCL 60 MG PO TABS: 60.0000 mg | ORAL_TABLET | Freq: Four times a day (QID) | ORAL | Status: DC

## 2011-06-18 MED ORDER — ENSURE COMPLETE PO LIQD: 237.0000 mL | Freq: Every day | ORAL | Status: DC

## 2011-06-18 NOTE — Progress Notes (Addendum)
Clinical Social Worker facilitated discharge by contacting facility, Royanne Foots and family, son at bedside. Patient's son will transport patient back to facility. Patient's DNR form will be placed inside packet. CSW will print out discharge packet and give to son to take to facility. CSW will sign off, as social work intervention is no longer needed.   Rozetta Nunnery MSW, [employer] [phone]

## 2011-06-18 NOTE — Discharge Summary (Signed)
DISCHARGE SUMMARY  Stacey Huang  MR#: [id number]  DOB:[birth year]  Date of Admission: 06/12/2011 Date of Discharge: 06/18/2011  Attending Physician:Mikyle Sox T  Patient's WUJ:[email or web address], Stacey Cooter, MD  Consults: Telephone consult w/ Ortho   Disposition: D/C to SNF/Memory Care Unit  Follow-up Appts: Discharge Orders    Future Orders Please Complete By Expires   Increase activity slowly      Scheduling Instructions:   L arm is to be kept in sling and swathe at all times - sling should be removed by therapist once a day for range of motion exercises of the ELBOW JOINT, keeping the shoulder joint immobilized      Follow-up Information    Follow up with Velna Ochs, MD. Call in 1 day. (call to scheduel an appointment to have your shoulder checked in 2-3 weeks time)    Contact information:   [address] [place] [address] [phone]         Discharge Diagnoses: Present on Admission:  .Atrial fibrillation with RVR .Dementia .Shoulder fracture, left .UTI (lower urinary tract infection)  Initial presentation: 78 year old female with history of advanced dementia, recurrent UTIs, and atrial fibrillation who sustained a witnessed mechanical fall which led to her landing on her left shoulder. She was brought into the emergency room to be evaluated and it was noticed that she had an obvious deformity to the left shoulder. X-rays confirmed a proximal humerus fracture. In addition she was found have a UTI and also to be in atrial fibrillation with rapid ventricular rate.  Hospital Course:  afib w/ rvr  Rate control has improved with titration of meds - she will need to be monitored for the development of bradycardia or recurrent tachycarida - not an appropriate candidate for anticoag given advance dementia and attendant high fall risk   hypokalemia  Goal is to keep K+ 4.0 or > - supplemented - Mg normal - K+ now at goal   Alzheimer's dementia  Appears to be at  baseline - to return to Memory Care Unit  UTI  Was tx with 7 day course of empiric abx - appears culture was not sent - no evidence of persistent infection at time of d/c   Comminuted displaced fracture involving the proximal left humerus at the surgical neck extending into the greater tuberosity  No definite dislocation identified on x-ray - f/u x-ray suggests no significant change - per admitting MD, "ER spoke to orthopedic surgery recommended conservative management with sling" - no evidence of discomfort at this time - pt will need to be seen in Ortho office in f/u after d/c - pt MUST be in sling AND swath to immobilize the arm, but the sling should be removed and ROM of the elbow exercised daily to assure the elbow joint does not loose mobility - spoke with Dr. Yisroel Ramming who agreed to see patient in his office in 2-3 weeks time for a f/u   compression fracture of the T11 vertebral body  Incidentally noted on CXR - begin calcitonin - follow clinically - does not appear to be causing significant pain  Medication List  As of 06/18/2011  1:46 PM   STOP taking these medications         atenolol 25 MG tablet      diltiazem 120 MG 24 hr capsule      ferrous sulfate 220 (44 FE) MG/5ML solution      potassium chloride 10 MEQ tablet         TAKE these medications  acetaminophen 325 MG tablet   Commonly known as: TYLENOL   Take 650 mg by mouth every 6 (six) hours as needed. For pain      aspirin 325 MG tablet   Take 1 tablet (325 mg total) by mouth daily.      calcitonin (salmon) 200 UNIT/ACT nasal spray   Commonly known as: MIACALCIN/FORTICAL   Place 1 spray into the nose daily.      Calcium 600+D 600-400 MG-UNIT per tablet   Generic drug: Calcium Carbonate-Vitamin D   Take 1 tablet by mouth daily.      cholecalciferol 1000 UNITS tablet   Commonly known as: VITAMIN D   Take 1,000 Units by mouth daily.      Cranberry 500 MG Caps   Take 1 capsule by mouth 2 (two) times  daily.      diltiazem 60 MG tablet   Commonly known as: CARDIZEM   Take 1 tablet (60 mg total) by mouth every 6 (six) hours.      feeding supplement Liqd   Take 237 mLs by mouth daily at 12 noon.      feeding supplement Liqd   Take 30 mLs by mouth 2 (two) times daily with a meal.      memantine 5 MG tablet   Commonly known as: NAMENDA   Take 5 mg by mouth 2 (two) times daily.      metoprolol tartrate 25 MG tablet   Commonly known as: LOPRESSOR   Take 1 tablet (25 mg total) by mouth 2 (two) times daily.      traMADol 50 MG tablet   Commonly known as: ULTRAM   Take 50 mg by mouth every 6 (six) hours as needed. For pain           Day of Discharge BP 108/84  Pulse 81  Temp 98.6 F (37 C) (Axillary)  Resp 13  Ht 5\' 3"  (1.6 m)  Wt 51.4 kg (113 lb 5.1 oz)  BMI 20.07 kg/m2  SpO2 98%  Physical Exam: General: No acute respiratory distress Lungs: Clear to auscultation bilaterally without wheezes or crackles Cardiovascular: Irreg irreg w/o gallup or rub Abdomen: Nontender, nondistended, soft, bowel sounds positive, no rebound, no ascites, no appreciable mass Extremities: No significant cyanosis, clubbing, or edema bilateral lower extremities - L UE in sling and swathe - no pain to exam  Results for orders placed during the hospital encounter of 06/12/11 (from the past 24 hour(s))  BASIC METABOLIC PANEL     Status: Abnormal   Collection Time   06/18/11  5:15 AM      Component Value Range   Sodium 134 (*) 135 - 145 mEq/L   Potassium 4.0  3.5 - 5.1 mEq/L   Chloride 97  96 - 112 mEq/L   CO2 28  19 - 32 mEq/L   Glucose, Bld 142 (*) 70 - 99 mg/dL   BUN 26 (*) 6 - 23 mg/dL   Creatinine, Ser 3.08  0.50 - 1.10 mg/dL   Calcium 9.4  8.4 - 65.7 mg/dL   GFR calc non Af Amer 76 (*) >90 mL/min   GFR calc Af Amer 88 (*) >90 mL/min   Time spent in discharge (includes decision making & examination of pt): >30 minutes  06/18/2011, 1:46 PM   Lonia Blood, MD [facility] Office  [phone] Pager [phone]  On-Call/Text Page:      [email or web address]      password [email or web address]    /c

## 2011-06-18 NOTE — Progress Notes (Signed)
CSW spoke with Modena Nunnery at facility, [facility] and they are able to receive patient today. CSW will continue to follow to facilitate discharge back to facility.   Rozetta Nunnery MSW, [employer] [phone]

## 2011-06-18 NOTE — Discharge Instructions (Signed)
Humerus Fracture, Treated with Immobilization  The humerus is the large bone in your upper arm. You have a broken (fractured) humerus. These fractures are easily diagnosed with X-rays. TREATMENT  Simple fractures which will heal without disability are treated with simple immobilization. Immobilization means you will wear a cast, splint, or sling. You have a fracture which will do well with immobilization. The fracture will heal well simply by being held in a good position until it is stable enough to begin range of motion exercises. Do not take part in activities which would further injure your arm.  HOME CARE INSTRUCTIONS   Put ice on the injured area.   Put ice in a plastic bag.   Place a towel between your skin and the bag.   Leave the ice on for 15 to 20 minutes, 3 to 4 times a day.   If you have a cast:   Do not scratch the skin under the cast using sharp or pointed objects.   Check the skin around the cast every day. You may put lotion on any red or sore areas.   Keep your cast dry and clean.   If you have a splint:   Wear the splint as directed.   Keep your splint dry and clean.   You may loosen the elastic around the splint if your fingers become numb, tingle, or turn cold or blue.   If you have a sling:   Wear the sling as directed.   Do not put pressure on any part of your cast or splint until it is fully hardened.   Your cast or splint can be protected during bathing with a plastic bag. Do not lower the cast or splint into water.   Only take over-the-counter or prescription medicines for pain, discomfort, or fever as directed by your caregiver.   Do range of motion exercises as instructed by your caregiver.   Follow up as directed by your caregiver. This is very important in order to avoid permanent injury or disability and chronic pain.  SEEK IMMEDIATE MEDICAL CARE IF:   Your skin or nails in the injured arm turn blue or gray.   Your arm feels cold or numb.    You develop severe pain in the injured arm.   You are having problems with the medicines you were given.  MAKE SURE YOU:   Understand these instructions.   Will watch your condition.   Will get help right away if you are not doing well or get worse.  Document Released: 03/26/2000 Document Revised: 12/07/2010 Document Reviewed: 02/01/2010 [facility] Patient Information 2012 [place], [place].  Atrial Fibrillation Your caregiver has diagnosed you with atrial fibrillation (AFib). The heart normally beats very regularly; AFib is a type of irregular heartbeat. The heart rate may be faster or slower than normal. This can prevent your heart from pumping as well as it should. AFib can be constant (chronic) or intermittent (paroxysmal). CAUSES  Atrial fibrillation may be caused by:  Heart disease, including heart attack, coronary artery disease, heart failure, diseases of the heart valves, and others.   Blood clot in the lungs (pulmonary embolism).   Pneumonia or other infections.   Chronic lung disease.   Thyroid disease.   Toxins. These include alcohol, some medications (such as decongestant medications or diet pills), and caffeine.  In some people, no cause for AFib can be found. This is referred to as Lone Atrial Fibrillation. SYMPTOMS   Palpitations or a fluttering in your chest.  A vague sense of chest discomfort.   Shortness of breath.   Sudden onset of lightheadedness or weakness.  Sometimes, the first sign of AFib can be a complication of the condition. This could be a stroke or heart failure. DIAGNOSIS  Your description of your condition may make your caregiver suspicious of atrial fibrillation. Your caregiver will examine your pulse to determine if fibrillation is present. An EKG (electrocardiogram) will confirm the diagnosis. Further testing may help determine what caused you to have atrial fibrillation. This may include chest x-ray, echocardiogram, blood tests, or CT  scans. PREVENTION  If you have previously had atrial fibrillation, your caregiver may advise you to avoid substances known to cause the condition (such as stimulant medications, and possibly caffeine or alcohol). You may be advised to use medications to prevent recurrence. Proper treatment of any underlying condition is important to help prevent recurrence. PROGNOSIS  Atrial fibrillation does tend to become a chronic condition over time. It can cause significant complications (see below). Atrial fibrillation is not usually immediately life-threatening, but it can shorten your life expectancy. This seems to be worse in women. If you have lone atrial fibrillation and are under 39 years old, the risk of complications is very low, and life expectancy is not shortened. RISKS AND COMPLICATIONS  Complications of atrial fibrillation can include stroke, chest pain, and heart failure. Your caregiver will recommend treatments for the atrial fibrillation, as well as for any underlying conditions, to help minimize risk of complications. TREATMENT  Treatment for AFib is divided into several categories:  Treatment of any underlying condition.   Converting you out of AFib into a regular (sinus) rhythm.   Controlling rapid heart rate.   Prevention of blood clots and stroke.  Medications and procedures are available to convert your atrial fibrillation to sinus rhythm. However, recent studies have shown that this may not offer you any advantage, and cardiac experts are continuing research and debate on this topic. More important is controlling your rapid heartbeat. The rapid heartbeat causes more symptoms, and places strain on your heart. Your caregiver will advise you on the use of medications that can control your heart rate. Atrial fibrillation is a strong stroke risk. You can lessen this risk by taking blood thinning medications such as Coumadin (warfarin), or sometimes aspirin. These medications need close  monitoring by your caregiver. Over-medication can cause bleeding. Too little medication may not protect against stroke. HOME CARE INSTRUCTIONS   If your caregiver prescribed medicine to make your heartbeat more normally, take as directed.   If blood thinners were prescribed by your caregiver, take EXACTLY as directed.   Perform blood tests EXACTLY as directed.   Quit smoking. Smoking increases your cardiac and lung (pulmonary) risks.   DO NOT drink alcohol.   DO NOT drink caffeinated drinks (e.g. coffee, soda, chocolate, and leaf teas). You may drink decaffeinated coffee, soda or tea.   If you are overweight, you should choose a reduced calorie diet to lose weight. Please see a registered dietitian if you need more information about healthy weight loss. DO NOT USE DIET PILLS as they may aggravate heart problems.   If you have other heart problems that are causing AFib, you may need to eat a low salt, fat, and cholesterol diet. Your caregiver will tell you if this is necessary.   Exercise every day to improve your physical fitness. Stay active unless advised otherwise.   If your caregiver has given you a follow-up appointment, it is very  important to keep that appointment. Not keeping the appointment could result in heart failure or stroke. If there is any problem keeping the appointment, you must call back to this facility for assistance.  SEEK MEDICAL CARE IF:  You notice a change in the rate, rhythm or strength of your heartbeat.   You develop an infection or any other change in your overall health status.  SEEK IMMEDIATE MEDICAL CARE IF:   You develop chest pain, abdominal pain, sweating, weakness or feel sick to your stomach (nausea).   You develop shortness of breath.   You develop swollen feet and ankles.   You develop dizziness, numbness, or weakness of your face or limbs, or any change in vision or speech.  MAKE SURE YOU:   Understand these instructions.   Will watch  your condition.   Will get help right away if you are not doing well or get worse.  Document Released: 12/18/2004 Document Revised: 12/07/2010 Document Reviewed: 07/23/2007 [facility] Patient Information 2012 [place], [place].

## 2011-06-18 NOTE — Progress Notes (Signed)
06/18/11 1200  PT Visit Information  Last PT Received On 06/18/11  Assistance Needed +2  PT Time Calculation  PT Start Time 1155  PT Stop Time 1223  PT Time Calculation (min) 28 min  Subjective Data  Subjective Hey  Patient Stated Goal pt unable to state goals due to Altered mental status.   Precautions  Precautions Fall  Required Braces or Orthoses Other Brace/Splint  Other Brace/Splint shoulder immobilizer.    Restrictions  Weight Bearing Restrictions Yes  LUE Weight Bearing NWB  Other Position/Activity Restrictions Shoulder immobilizer on L UE.    Cognition  Overall Cognitive Status History of cognitive impairments - at baseline  Area of Impairment Attention;Memory;Following commands;Safety/judgement;Awareness of errors  Arousal/Alertness Awake/alert  Orientation Level Disoriented X4  Behavior During Session Restless  Current Attention Level Focused  Following Commands Follows one step commands inconsistently  Bed Mobility  Bed Mobility Rolling Right;Right Sidelying to Sit  Rolling Right 1: +2 Total assist  Rolling Right: Patient Percentage 10%  Right Sidelying to Sit 1: +2 Total assist  Right Sidelying to Sit: Patient Percentage 10%  Sitting - Scoot to Edge of Bed 1: +1 Total assist  Details for Bed Mobility Assistance vc's used to direct and calm pt during mobility; significant assist for rolling and transistion side to sit  Transfers  Transfers Sit to Stand;Stand to Sit  Sit to Stand 3: Mod assist  Sit to Stand: Patient Percentage 50%  Stand to Sit 3: Mod assist;To chair/3-in-1  Details for Transfer Assistance vc/tc's for guidance and redirction, helping pt stay calm;  mod lifting/stability assist  Ambulation/Gait  Ambulation/Gait Assistance 1: +2 Total assist  Ambulation/Gait: Patient Percentage 60%  Ambulation Distance (Feet) 20 Feet  Assistive device 2 person hand held assist  Ambulation/Gait Assistance Details assist for guidance and stability  Gait Pattern  Step-through pattern;Decreased step length - right;Decreased step length - left;Decreased stride length  Stairs No  Wheelchair Mobility  Wheelchair Mobility No  Balance  Balance Assessed Yes  Static Sitting Balance  Static Sitting - Balance Support Feet supported;Bilateral upper extremity supported  Static Sitting - Level of Assistance 4: Min assist  Static Sitting - Comment/# of Minutes 4-5 minutes.  Pt slowly drifting posteriorly  after maintaining upright sit 2-3 secs.  Easy to help her recover sitting  PT - End of Session  Activity Tolerance Patient limited by fatigue;Patient tolerated treatment well  Patient left in chair;with call bell/phone within reach;with family/visitor present  Nurse Communication Mobility status  PT - Assessment/Plan  Comments on Treatment Session With direction/redirection verbally and tactilly pt is make modest improvements.  Walked today with 2 person assist and at a generally steady level  PT Plan Discharge plan remains appropriate  PT Frequency Min 2X/week  Recommendations for Other Services OT consult  Follow Up Recommendations Skilled nursing facility;Supervision/Assistance - 24 hour  Equipment Recommended Defer to next venue  Acute Rehab PT Goals  PT Goal Formulation Patient unable to participate in goal setting  Time For Goal Achievement 06/28/11  Potential to Achieve Goals Fair  PT Goal: Rolling Supine to Right Side - Progress Progressing toward goal  PT Goal: Supine/Side to Sit - Progress Progressing toward goal  PT Goal: Sit at Edge Of Bed - Progress Progressing toward goal  PT Goal: Sit to Supine/Side - Progress Progressing toward goal  PT Goal: Sit to Stand - Progress Progressing toward goal  PT Goal: Stand to Sit - Progress Progressing toward goal  PT Transfer Goal: Bed to Chair/Chair  to Bed - Progress Progressing toward goal  PT Goal: Ambulate - Progress Progressing toward goal  PT General Charges  $$ ACUTE PT VISIT 1 Procedure  PT  Treatments  $Gait Training 8-22 mins  $Therapeutic Activity 8-22 mins   06/18/2011  Price Bing, PT [phone] (pager)

## 2011-06-22 ENCOUNTER — Inpatient Hospital Stay (HOSPITAL_COMMUNITY)
Admission: EM | Admit: 2011-06-22 | Discharge: 2011-06-25 | DRG: 309 | Disposition: A | Discharge status: Skilled Nursing Facility | Payer: Medicare Other | Attending: Internal Medicine | Admitting: Internal Medicine

## 2011-06-22 ENCOUNTER — Encounter (HOSPITAL_COMMUNITY): Payer: Self-pay | Admitting: *Deleted

## 2011-06-22 ENCOUNTER — Emergency Department (HOSPITAL_COMMUNITY): Payer: Medicare Other

## 2011-06-22 DIAGNOSIS — K219 Gastro-esophageal reflux disease without esophagitis: Secondary | ICD-10-CM

## 2011-06-22 DIAGNOSIS — I4891 Unspecified atrial fibrillation: Principal | ICD-10-CM | POA: Diagnosis present

## 2011-06-22 DIAGNOSIS — M8448XA Pathological fracture, other site, initial encounter for fracture: Secondary | ICD-10-CM | POA: Diagnosis present

## 2011-06-22 DIAGNOSIS — E876 Hypokalemia: Secondary | ICD-10-CM | POA: Diagnosis present

## 2011-06-22 DIAGNOSIS — S4292XA Fracture of left shoulder girdle, part unspecified, initial encounter for closed fracture: Secondary | ICD-10-CM | POA: Diagnosis present

## 2011-06-22 DIAGNOSIS — S22080A Wedge compression fracture of T11-T12 vertebra, initial encounter for closed fracture: Secondary | ICD-10-CM | POA: Diagnosis present

## 2011-06-22 DIAGNOSIS — F0390 Unspecified dementia without behavioral disturbance: Secondary | ICD-10-CM

## 2011-06-22 DIAGNOSIS — D649 Anemia, unspecified: Secondary | ICD-10-CM | POA: Diagnosis present

## 2011-06-22 DIAGNOSIS — F0280 Dementia in other diseases classified elsewhere without behavioral disturbance: Secondary | ICD-10-CM | POA: Diagnosis present

## 2011-06-22 DIAGNOSIS — S42309D Unspecified fracture of shaft of humerus, unspecified arm, subsequent encounter for fracture with routine healing: Secondary | ICD-10-CM

## 2011-06-22 DIAGNOSIS — S42209A Unspecified fracture of upper end of unspecified humerus, initial encounter for closed fracture: Secondary | ICD-10-CM

## 2011-06-22 DIAGNOSIS — Z66 Do not resuscitate: Secondary | ICD-10-CM | POA: Diagnosis present

## 2011-06-22 DIAGNOSIS — D472 Monoclonal gammopathy: Secondary | ICD-10-CM | POA: Diagnosis present

## 2011-06-22 DIAGNOSIS — G309 Alzheimer's disease, unspecified: Secondary | ICD-10-CM | POA: Diagnosis present

## 2011-06-22 LAB — URINALYSIS, MICROSCOPIC ONLY
Bilirubin Urine: NEGATIVE
Hgb urine dipstick: NEGATIVE
Nitrite: NEGATIVE
Specific Gravity, Urine: 1.033 — ABNORMAL HIGH (ref 1.005–1.030)
pH: 5.5 (ref 5.0–8.0)

## 2011-06-22 LAB — CBC
HCT: 33.2 % — ABNORMAL LOW (ref 36.0–46.0)
Hemoglobin: 10.6 g/dL — ABNORMAL LOW (ref 12.0–15.0)
MCV: 91.7 fL (ref 78.0–100.0)
RBC: 3.62 MIL/uL — ABNORMAL LOW (ref 3.87–5.11)
WBC: 9.2 10*3/uL (ref 4.0–10.5)

## 2011-06-22 LAB — CARDIAC PANEL(CRET KIN+CKTOT+MB+TROPI)
Total CK: 43 U/L (ref 7–177)
Troponin I: <0.3 ng/mL (ref <0.30)

## 2011-06-22 LAB — BASIC METABOLIC PANEL
BUN: 25 mg/dL — ABNORMAL HIGH (ref 6–23)
CO2: 27 mEq/L (ref 19–32)
Chloride: 106 mEq/L (ref 96–112)
Creatinine, Ser: 0.56 mg/dL (ref 0.50–1.10)

## 2011-06-22 MED ORDER — ONDANSETRON HCL 4 MG/2ML IJ SOLN
4.0000 mg | Freq: Once | INTRAMUSCULAR | Status: AC
  Administered 2011-06-22: 4 mg via INTRAVENOUS
  Filled 2011-06-22: qty 2

## 2011-06-22 MED ORDER — DILTIAZEM HCL 100 MG IV SOLR
5.0000 mg/h | INTRAVENOUS | Status: DC
  Administered 2011-06-23: 15 mg/h via INTRAVENOUS
  Filled 2011-06-22: qty 100

## 2011-06-22 MED ORDER — SODIUM CHLORIDE 0.9 % IV BOLUS (SEPSIS)
1000.0000 mL | Freq: Once | INTRAVENOUS | Status: AC
  Administered 2011-06-22: 1000 mL via INTRAVENOUS

## 2011-06-22 MED ORDER — DILTIAZEM LOAD VIA INFUSION
10.0000 mg | Freq: Once | INTRAVENOUS | Status: AC
  Administered 2011-06-22: 10 mg via INTRAVENOUS
  Filled 2011-06-22: qty 10

## 2011-06-22 MED ORDER — DILTIAZEM HCL 100 MG IV SOLR
5.0000 mg/h | Freq: Once | INTRAVENOUS | Status: AC
  Administered 2011-06-22: 5 mg/h via INTRAVENOUS
  Filled 2011-06-22: qty 100

## 2011-06-22 MED ORDER — DILTIAZEM LOAD VIA INFUSION
10.0000 mg | Freq: Once | INTRAVENOUS | Status: DC
  Filled 2011-06-22: qty 10

## 2011-06-22 MED ORDER — SODIUM CHLORIDE 0.9 % IJ SOLN
3.0000 mL | Freq: Two times a day (BID) | INTRAMUSCULAR | Status: DC
  Administered 2011-06-23 – 2011-06-25 (×4): 3 mL via INTRAVENOUS

## 2011-06-22 MED ORDER — METOPROLOL TARTRATE 1 MG/ML IV SOLN
5.0000 mg | Freq: Once | INTRAVENOUS | Status: AC
  Administered 2011-06-22: 5 mg via INTRAVENOUS
  Filled 2011-06-22: qty 5

## 2011-06-22 MED ORDER — DILTIAZEM HCL 25 MG/5ML IV SOLN
10.0000 mg | Freq: Once | INTRAVENOUS | Status: DC
  Filled 2011-06-22: qty 5

## 2011-06-22 MED ORDER — METOPROLOL TARTRATE 1 MG/ML IV SOLN: 2.5000 mg | Freq: Once | INTRAVENOUS | Status: DC

## 2011-06-22 NOTE — ED Provider Notes (Signed)
History     CSN: [id number]  Arrival date & time 06/22/11  1623   First MD Initiated Contact with Patient 06/22/11 1651      Chief Complaint  Patient presents with  . Anxiety     The history is provided by the nursing home and the EMS personnel. History Limited By: Level  5 caveat: Dementia.   the patient has a history of atrial fibrillation and presents to the emergency department from the dementia Alzheimer's unit with increased heart rate or respirations.  He was thoughtful nursing staff that she's been more fearful of walking and very anxious and reported she is anxious on transport to the emergency department.  The patient was recently in the hospital for a left proximal humerus fracture and atrial fibrillation with rapid ventricular response.  As reported that she's been without symptoms for the past several days.  No diarrhea or vomiting was reported  Past Medical History  Diagnosis Date  . Alzheimer disease   . Atrial fibrillation   . Gastro - esophageal reflux disease   . Ischemic colitis, enteritis, or enterocolitis     Past Surgical History  Procedure Date  . Unknown   . Esophagogastroduodenoscopy 11/10/2010    Procedure: ESOPHAGOGASTRODUODENOSCOPY (EGD);  Surgeon: Theda Belfast;  Location: [facility];  Service: Endoscopy;  Laterality: N/A;  . Flexible sigmoidoscopy 11/10/2010    Procedure: FLEXIBLE SIGMOIDOSCOPY;  Surgeon: Theda Belfast;  Location: [facility];  Service: Gastroenterology;  Laterality: N/A;    Family History  Problem Relation Age of Onset  . Diabetes Mother     History  Substance Use Topics  . Smoking status: Never Smoker   . Smokeless tobacco: Never Used  . Alcohol Use: No    OB History    Grav Para Term Preterm Abortions TAB SAB Ect Mult Living                  Review of Systems  Unable to perform ROS All other systems reviewed and are negative.    Allergies  Review of patient's allergies indicates no known allergies.  Home  Medications   Current Outpatient Rx  Name Route Sig Dispense Refill  . ACETAMINOPHEN 325 MG PO TABS Oral Take 650 mg by mouth every 6 (six) hours as needed. For pain     . ASPIRIN 325 MG PO TABS Oral Take 325 mg by mouth daily.    . ATENOLOL 25 MG PO TABS Oral Take 25 mg by mouth daily.    Marland Kitchen CALCITONIN (SALMON) 200 UNIT/ACT NA SOLN Nasal Place 1 spray into the nose daily.    Marland Kitchen CALCIUM CARBONATE-VITAMIN D 600-400 MG-UNIT PO TABS Oral Take 1 tablet by mouth daily.      Marland Kitchen VITAMIN D 1000 UNITS PO TABS Oral Take 1,000 Units by mouth daily.      Marland Kitchen CRANBERRY 500 MG PO CAPS Oral Take 1 capsule by mouth 2 (two) times daily.    Marland Kitchen DILTIAZEM HCL 60 MG PO TABS Oral Take 60 mg by mouth 4 (four) times daily.    Marland Kitchen ENSURE PLUS PO LIQD Oral Take 237 mLs by mouth daily at 12 noon.    Marland Kitchen MEMANTINE HCL 5 MG PO TABS Oral Take 5 mg by mouth 2 (two) times daily.      Marland Kitchen METOPROLOL TARTRATE 25 MG PO TABS Oral Take 25 mg by mouth 2 (two) times daily.    . TRAMADOL HCL 50 MG PO TABS Oral Take 50 mg by  mouth every 4 (four) hours as needed. For pain      BP 111/90  Pulse 125  Temp 97.7 F (36.5 C) (Oral)  Resp 32  SpO2 100%  Physical Exam  Nursing note and vitals reviewed. Constitutional: She appears well-developed and well-nourished. No distress.  HENT:  Head: Normocephalic and atraumatic.  Eyes: EOM are normal.  Neck: Normal range of motion.  Cardiovascular: Regular rhythm and normal heart sounds.        Tachycardia  Pulmonary/Chest: Effort normal and breath sounds normal.  Abdominal: Soft. She exhibits no distension. There is no tenderness.  Musculoskeletal: Normal range of motion.  Neurological: She is alert.       Able to follow only very simple commands  Skin: Skin is warm and dry.  Psychiatric: She has a normal mood and affect. Judgment normal.    ED Course  Procedures (including critical care time)   Date: 06/22/2011  Rate: 130  Rhythm: atrial flutter with 2:1 block  QRS Axis: normal   Intervals: normal  ST/T Wave abnormalities: normal  Conduction Disutrbances: none  Narrative Interpretation:   Old EKG Reviewed: changed from prior ecg     Labs Reviewed  CBC - Abnormal; Notable for the following:    RBC 3.62 (*)     Hemoglobin 10.6 (*)     HCT 33.2 (*)     All other components within normal limits  BASIC METABOLIC PANEL - Abnormal; Notable for the following:    Glucose, Bld 103 (*)     BUN 25 (*)     GFR calc non Af Amer 82 (*)     All other components within normal limits  URINALYSIS, WITH MICROSCOPIC - Abnormal; Notable for the following:    Specific Gravity, Urine 1.033 (*)     All other components within normal limits  TROPONIN I   Dg Chest Portable 1 View  06/22/2011  *RADIOLOGY REPORT*  Clinical Data: Tachy arrhythmia  PORTABLE CHEST - 1 VIEW  Comparison: 06/12/2011.  Findings: The heart is enlarged but stable.  No acute pulmonary findings.  No pleural effusion.  No pneumothorax.  No definite rib fractures.  The left humeral head/neck fracture is noted.  IMPRESSION: Cardiac enlargement but no acute pulmonary findings.  Original Report Authenticated By: P. Loralie Champagne, M.D.    I personally reviewed the imaging tests through PACS system  I reviewed available ER/hospitalization records thought the EMR    1. Atrial fibrillation with RVR   MDM  I suspect the patient has atrial flutter with 2:1 block which is causing her to be symptomatic with dyspnea.  She has severe dementia and therefore is unable to provide any history.  I do see in her prior notes that she is a DO NOT RESUSCITATE        Lyanne Co, MD 06/22/11 [phone]

## 2011-06-22 NOTE — ED Notes (Signed)
Pt is from dementia/alzheimer's unit at [facility] and broke her shoulder 2 wks ago. Ever since she has been fearful of walking and very anxious. Today she was doing therapy to help her walk again and her HR and Respirations increased so she was sent in by EMS. Pt is very anxious at this time and fearful of being away from her home.

## 2011-06-22 NOTE — ED Notes (Signed)
ZOX:[id number]<AV> Expected date:06/22/11<BR> Expected time: 4:24 PM<BR> Means of arrival:Ambulance<BR> Comments:<BR> anxiety

## 2011-06-22 NOTE — H&P (Signed)
Stacey Huang is an 78 y.o. female.    Pcp: Florentina Jenny   Chief Complaint: afib with rvr HPI: 78 yo female with hx of Atrial fibrillation (presumed not to be on coumadin due to fall risk), Dementia presents apparently with "moaning" and tachycardia.  Unable to obtain any direct hx from patient due to the fact of her severe dementia. Hx is obtained from writing on Fax from [address] of [address] T 97.2  P 130,  Bp 138/74  Pox 97%ra.   Pt denies cp ,palp, sob, n/v, lower ext edema. Note that she had recent humeral fracture last week? L shoulder.    In Ed, pt given lopressor iv and started on cardizem drip.    Past Medical History  Diagnosis Date  . Alzheimer disease   . Atrial fibrillation   . Gastro - esophageal reflux disease   . Ischemic colitis, enteritis, or enterocolitis     Past Surgical History  Procedure Date  . Unknown   . Esophagogastroduodenoscopy 11/10/2010    Procedure: ESOPHAGOGASTRODUODENOSCOPY (EGD);  Surgeon: Theda Belfast;  Location: [facility];  Service: Endoscopy;  Laterality: N/A;  . Flexible sigmoidoscopy 11/10/2010    Procedure: FLEXIBLE SIGMOIDOSCOPY;  Surgeon: Theda Belfast;  Location: [facility];  Service: Gastroenterology;  Laterality: N/A;    Family History  Problem Relation Age of Onset  . Diabetes Mother    Social History:  reports that she has never smoked. She has never used smokeless tobacco. She reports that she does not drink alcohol or use illicit drugs.  Allergies: No Known Allergies   (Not in a hospital admission)  Results for orders placed during the hospital encounter of 06/22/11 (from the past 48 hour(s))  URINALYSIS, WITH MICROSCOPIC     Status: Abnormal   Collection Time   06/22/11  5:22 PM      Component Value Range Comment   Color, Urine YELLOW  YELLOW    APPearance CLEAR  CLEAR    Specific Gravity, Urine 1.033 (*) 1.005 - 1.030    pH 5.5  5.0 - 8.0    Glucose, UA NEGATIVE  NEGATIVE mg/dL    Hgb urine dipstick  NEGATIVE  NEGATIVE    Bilirubin Urine NEGATIVE  NEGATIVE    Ketones, ur NEGATIVE  NEGATIVE mg/dL    Protein, ur NEGATIVE  NEGATIVE mg/dL    Urobilinogen, UA 0.2  0.0 - 1.0 mg/dL    Nitrite NEGATIVE  NEGATIVE    Leukocytes, UA NEGATIVE  NEGATIVE    RBC / HPF 0-2  <3 RBC/hpf    Bacteria, UA RARE  RARE    Squamous Epithelial / LPF RARE  RARE    Urine-Other MUCOUS PRESENT     CBC     Status: Abnormal   Collection Time   06/22/11  5:40 PM      Component Value Range Comment   WBC 9.2  4.0 - 10.5 K/uL    RBC 3.62 (*) 3.87 - 5.11 MIL/uL    Hemoglobin 10.6 (*) 12.0 - 15.0 g/dL    HCT 16.1 (*) 09.6 - 46.0 %    MCV 91.7  78.0 - 100.0 fL    MCH 29.3  26.0 - 34.0 pg    MCHC 31.9  30.0 - 36.0 g/dL    RDW 04.5  40.9 - 81.1 %    Platelets 357  150 - 400 K/uL   BASIC METABOLIC PANEL     Status: Abnormal   Collection Time   06/22/11  5:40 PM      Component Value Range Comment   Sodium 142  135 - 145 mEq/L    Potassium 3.6  3.5 - 5.1 mEq/L    Chloride 106  96 - 112 mEq/L    CO2 27  19 - 32 mEq/L    Glucose, Bld 103 (*) 70 - 99 mg/dL    BUN 25 (*) 6 - 23 mg/dL    Creatinine, Ser 6.29  0.50 - 1.10 mg/dL    Calcium 9.0  8.4 - 52.8 mg/dL    GFR calc non Af Amer 82 (*) >90 mL/min    GFR calc Af Amer >90  >90 mL/min   TROPONIN I     Status: Normal   Collection Time   06/22/11  5:40 PM      Component Value Range Comment   Troponin I <0.30  <0.30 ng/mL    Dg Chest Portable 1 View  06/22/2011  *RADIOLOGY REPORT*  Clinical Data: Tachy arrhythmia  PORTABLE CHEST - 1 VIEW  Comparison: 06/12/2011.  Findings: The heart is enlarged but stable.  No acute pulmonary findings.  No pleural effusion.  No pneumothorax.  No definite rib fractures.  The left humeral head/neck fracture is noted.  IMPRESSION: Cardiac enlargement but no acute pulmonary findings.  Original Report Authenticated By: P. Loralie Champagne, M.D.    Review of Systems  Unable to perform ROS: dementia    Blood pressure 133/80, pulse 126,  temperature 97.7 F (36.5 C), temperature source Oral, resp. rate 24, SpO2 100.00%. Physical Exam  Constitutional: She is oriented to person, place, and time. She appears well-developed and well-nourished. No distress.  HENT:  Head: Normocephalic and atraumatic.  Mouth/Throat: No oropharyngeal exudate.  Eyes: Conjunctivae are normal. Pupils are equal, round, and reactive to light. Right eye exhibits no discharge. Left eye exhibits no discharge. No scleral icterus.  Neck: Normal range of motion. Neck supple. No JVD present. No tracheal deviation present. No thyromegaly present.  Cardiovascular: Normal heart sounds.  Exam reveals no gallop and no friction rub.   No murmur heard.      Tachycardia, irr, irr, s1, s2  Respiratory: Effort normal and breath sounds normal. No stridor. No respiratory distress. She has no wheezes. She has no rales. She exhibits no tenderness.  GI: Soft. Bowel sounds are normal. She exhibits no distension. There is no tenderness. There is no rebound and no guarding.  Musculoskeletal: Normal range of motion. She exhibits no edema and no tenderness.  Lymphadenopathy:    She has no cervical adenopathy.  Neurological: She is alert and oriented to person, place, and time. She has normal reflexes. She displays normal reflexes. No cranial nerve deficit. She exhibits normal muscle tone. Coordination normal.  Skin: Skin is warm and dry. No rash noted. She is not diaphoretic. No erythema. No pallor.  Psychiatric: She has a normal mood and affect. Her behavior is normal. Judgment and thought content normal.     Assessment/Plan Afib with RVR: cardizem drip, Ecasa,  No coumadin due to fall risk L humeral fracture:  PT to evaluate and tx Dementia: continue on namenda Gerd: cont ppi Mgus: check cbc Hypokalemia: cont Windell Moment 06/22/2011, 9:39 PM

## 2011-06-22 NOTE — ED Notes (Signed)
Admitting MD at bedside.

## 2011-06-22 NOTE — ED Notes (Signed)
Pt's Cardizem titrated up to 10mg /hr.

## 2011-06-23 ENCOUNTER — Encounter (HOSPITAL_COMMUNITY): Payer: Self-pay

## 2011-06-23 DIAGNOSIS — K219 Gastro-esophageal reflux disease without esophagitis: Secondary | ICD-10-CM

## 2011-06-23 DIAGNOSIS — F0390 Unspecified dementia without behavioral disturbance: Secondary | ICD-10-CM

## 2011-06-23 DIAGNOSIS — D649 Anemia, unspecified: Secondary | ICD-10-CM | POA: Diagnosis present

## 2011-06-23 DIAGNOSIS — S42209A Unspecified fracture of upper end of unspecified humerus, initial encounter for closed fracture: Secondary | ICD-10-CM

## 2011-06-23 DIAGNOSIS — D472 Monoclonal gammopathy: Secondary | ICD-10-CM | POA: Diagnosis present

## 2011-06-23 DIAGNOSIS — I4891 Unspecified atrial fibrillation: Secondary | ICD-10-CM

## 2011-06-23 DIAGNOSIS — S22080A Wedge compression fracture of T11-T12 vertebra, initial encounter for closed fracture: Secondary | ICD-10-CM | POA: Diagnosis present

## 2011-06-23 DIAGNOSIS — I059 Rheumatic mitral valve disease, unspecified: Secondary | ICD-10-CM

## 2011-06-23 LAB — CBC
MCH: 29.2 pg (ref 26.0–34.0)
MCV: 91.7 fL (ref 78.0–100.0)
Platelets: 312 10*3/uL (ref 150–400)
RDW: 15.1 % (ref 11.5–15.5)
WBC: 8.3 10*3/uL (ref 4.0–10.5)

## 2011-06-23 LAB — CARDIAC PANEL(CRET KIN+CKTOT+MB+TROPI)
CK, MB: 3.5 ng/mL (ref 0.3–4.0)
Relative Index: INVALID (ref 0.0–2.5)
Relative Index: INVALID (ref 0.0–2.5)
Troponin I: <0.3 ng/mL (ref <0.30)
Troponin I: <0.3 ng/mL (ref <0.30)

## 2011-06-23 LAB — COMPREHENSIVE METABOLIC PANEL
ALT: 18 U/L (ref 0–35)
AST: 19 U/L (ref 0–37)
Albumin: 2.7 g/dL — ABNORMAL LOW (ref 3.5–5.2)
Calcium: 8.7 mg/dL (ref 8.4–10.5)
GFR calc Af Amer: >90 mL/min (ref >90)
Sodium: 141 mEq/L (ref 135–145)
Total Protein: 6.4 g/dL (ref 6.0–8.3)

## 2011-06-23 LAB — DIFFERENTIAL
Basophils Absolute: 0.1 10*3/uL (ref 0.0–0.1)
Basophils Relative: 1 % (ref 0–1)
Eosinophils Absolute: 0.4 10*3/uL (ref 0.0–0.7)
Eosinophils Relative: 4 % (ref 0–5)

## 2011-06-23 MED ORDER — ASPIRIN 325 MG PO TABS
325.0000 mg | ORAL_TABLET | Freq: Every day | ORAL | Status: DC
  Administered 2011-06-23 – 2011-06-25 (×3): 325 mg via ORAL
  Filled 2011-06-23 (×3): qty 1

## 2011-06-23 MED ORDER — DILTIAZEM HCL ER COATED BEADS 240 MG PO CP24
240.0000 mg | ORAL_CAPSULE | Freq: Every day | ORAL | Status: DC
  Administered 2011-06-23 – 2011-06-25 (×3): 240 mg via ORAL
  Filled 2011-06-23 (×3): qty 1

## 2011-06-23 MED ORDER — VITAMIN D3 25 MCG (1000 UNIT) PO TABS
1000.0000 [IU] | ORAL_TABLET | Freq: Every day | ORAL | Status: DC
  Administered 2011-06-23 – 2011-06-25 (×3): 1000 [IU] via ORAL
  Filled 2011-06-23 (×3): qty 1

## 2011-06-23 MED ORDER — CRANBERRY 500 MG PO CAPS: 1.0000 | ORAL_CAPSULE | Freq: Two times a day (BID) | ORAL | Status: DC

## 2011-06-23 MED ORDER — CALCIUM CARBONATE-VITAMIN D 600-400 MG-UNIT PO TABS: 1.0000 | ORAL_TABLET | Freq: Every day | ORAL | Status: DC

## 2011-06-23 MED ORDER — MEMANTINE HCL 5 MG PO TABS
5.0000 mg | ORAL_TABLET | Freq: Two times a day (BID) | ORAL | Status: DC
  Administered 2011-06-23 – 2011-06-25 (×5): 5 mg via ORAL
  Filled 2011-06-23 (×7): qty 1

## 2011-06-23 MED ORDER — POTASSIUM CHLORIDE CRYS ER 20 MEQ PO TBCR
20.0000 meq | EXTENDED_RELEASE_TABLET | Freq: Two times a day (BID) | ORAL | Status: DC
  Administered 2011-06-23 – 2011-06-24 (×3): 20 meq via ORAL
  Filled 2011-06-23 (×4): qty 1

## 2011-06-23 MED ORDER — CALCIUM CARBONATE-VITAMIN D 500-200 MG-UNIT PO TABS
1.0000 | ORAL_TABLET | Freq: Every day | ORAL | Status: DC
  Administered 2011-06-23 – 2011-06-25 (×3): 1 via ORAL
  Filled 2011-06-23 (×3): qty 1

## 2011-06-23 MED ORDER — ENSURE COMPLETE PO LIQD
237.0000 mL | Freq: Three times a day (TID) | ORAL | Status: DC
  Administered 2011-06-23 – 2011-06-25 (×8): 237 mL via ORAL

## 2011-06-23 MED ORDER — ENSURE PLUS PO LIQD
237.0000 mL | Freq: Three times a day (TID) | ORAL | Status: DC
  Filled 2011-06-23 (×2): qty 237

## 2011-06-23 MED ORDER — ATENOLOL 25 MG PO TABS
25.0000 mg | ORAL_TABLET | Freq: Every day | ORAL | Status: DC
  Administered 2011-06-23 – 2011-06-25 (×3): 25 mg via ORAL
  Filled 2011-06-23 (×3): qty 1

## 2011-06-23 MED ORDER — CALCITONIN (SALMON) 200 UNIT/ACT NA SOLN
1.0000 | Freq: Every day | NASAL | Status: DC
  Administered 2011-06-23 – 2011-06-25 (×3): 1 via NASAL
  Filled 2011-06-23: qty 3.7

## 2011-06-23 MED ORDER — TRAMADOL HCL 50 MG PO TABS
50.0000 mg | ORAL_TABLET | Freq: Four times a day (QID) | ORAL | Status: DC | PRN
  Administered 2011-06-23 – 2011-06-24 (×2): 50 mg via ORAL
  Filled 2011-06-23 (×2): qty 1

## 2011-06-23 MED ORDER — ACETAMINOPHEN 325 MG PO TABS: 650.0000 mg | ORAL_TABLET | Freq: Four times a day (QID) | ORAL | Status: DC | PRN

## 2011-06-23 MED ORDER — LORAZEPAM 2 MG/ML IJ SOLN
0.5000 mg | Freq: Once | INTRAMUSCULAR | Status: AC
  Administered 2011-06-23: 0.5 mg via INTRAVENOUS
  Filled 2011-06-23: qty 1

## 2011-06-23 NOTE — Plan of Care (Signed)
Problem: Phase I Progression Outcomes Goal: Initial discharge plan identified Outcome: Completed/Met Date Met:  06/23/11 To return back to SNF

## 2011-06-23 NOTE — Progress Notes (Signed)
PROGRESS NOTE  Stacey Huang ZOX:[id number] DOB: 07-16-1922 DOA: 06/22/2011 PCP: Florentina Jenny, MD  Brief narrative: Stacey Huang is an 78 year old female with a past medical history of atrial fibrillation, not on chronic Coumadin, recent left humeral fracture and dementia, who was brought to the hospital with tachycardia. Upon initial evaluation emergency department, the patient was found to be in atrial fibrillation with rapid ventricular response and was placed on a Cardizem drip.  Interim History: Stable in SDU overnight.  RN weaning Cardizem drip.   Assessment/Plan: Principal Problem:  *Atrial fibrillation with RVR  Heart rate now somewhat bradycardic.  RN weaning Cardizem drip.  Will resume oral therapy and d/c drip.  Cardiac markers negative x3 sets.  Continue aspirin, apparently not a Coumadin candidate due to fall risk.  F/U 2 D Echo.  OK to transfer to tele. Active Problems:  T11 compression fracture  Continue calcitonin, calcium, and vitamin D.  Hypokalemia  Replace. Goal potassium level = 4.0  Alzheimer's dementia  Continue Namenda.  Shoulder fracture, left  Continue sling and Ultram as needed for pain control.  Normocytic anemia/MGUS  Blood counts stable.   Code Status: DNR Family Communication: None at bedside, number listed for son "[name]" not operational. Disposition Plan: SNF when stable.  Medical Consultants:  None  Other consultants:  Physical therapy  Antibiotics:  None   Subjective  Stacey Huang is pleasantly confused.  She is unable to vocalize specific symptoms (advanced dementia).   Objective   Objective: Filed Vitals:   06/23/11 0300 06/23/11 0400 06/23/11 0700 06/23/11 0748  BP: 100/49  101/44   Pulse: 43 55 75   Temp:  97.5 F (36.4 C)  97.8 F (36.6 C)  TempSrc:  Oral  Oral  Resp: 19 14 15    Height:      Weight:      SpO2: 98% 99% 98%     Intake/Output Summary (Last 24 hours) at 06/23/11 8119 Last data filed at  06/23/11 0600  Gross per 24 hour  Intake     90 ml  Output    300 ml  Net   -210 ml    Exam: Gen:  NAD Cardiovascular:  HSIR Respiratory: Lungs CTAB Gastrointestinal: Abdomen soft, NT/ND with normal active bowel sounds. Extremities: No C/E/C    Data Reviewed: Basic Metabolic Panel:  Lab 06/23/11 1478 06/22/11 1740 06/18/11 0515 06/17/11 0525  NA 141 142 134* 137  K 3.3* 3.6 -- --  CL 107 106 97 101  CO2 26 27 28 27   GLUCOSE 98 103* 142* 111*  BUN 15 25* 26* 23  CREATININE 0.51 0.56 0.72 0.75  CALCIUM 8.7 9.0 9.4 9.6  MG -- -- -- --  PHOS -- -- -- --   GFR Estimated Creatinine Clearance: 41.8 ml/min (by C-G formula based on Cr of 0.51). Liver Function Tests:  Lab 06/23/11 0400  AST 19  ALT 18  ALKPHOS 83  BILITOT 0.4  PROT 6.4  ALBUMIN 2.7*   CBC:  Lab 06/23/11 0400 06/22/11 1740  WBC 8.3 9.2  NEUTROABS 4.8 --  HGB 9.5* 10.6*  HCT 29.8* 33.2*  MCV 91.7 91.7  PLT 312 357   Cardiac Enzymes:  Lab 06/23/11 0400 06/22/11 2215 06/22/11 1740  CKTOTAL 44 43 --  CKMB 3.5 3.7 --  CKMBINDEX -- -- --  TROPONINI <0.30 <0.30 <0.30   Microbiology Recent Results (from the past 240 hour(s))  MRSA PCR SCREENING     Status: Normal   Collection Time  06/23/11  1:20 AM      Component Value Range Status Comment   MRSA by PCR NEGATIVE  NEGATIVE Final     Procedures and Diagnostic Studies:  Dg Chest Portable 1 View 06/22/2011 IMPRESSION: Cardiac enlargement but no acute pulmonary findings.  Original Report Authenticated By: P. Loralie Champagne, M.D.   Scheduled Meds:    . aspirin  325 mg Oral Daily  . atenolol  25 mg Oral Daily  . calcitonin (salmon)  1 spray Alternating Nares Daily  . calcium-vitamin D  1 tablet Oral Daily  . cholecalciferol  1,000 Units Oral Daily  . diltiazem  10 mg Intravenous Once  . diltiazem  10 mg Intravenous Once  . diltiazem (CARDIZEM) infusion  5-15 mg/hr Intravenous Once  . feeding supplement  237 mL Oral TID BM  . LORazepam  0.5  mg Intravenous Once  . memantine  5 mg Oral BID  . metoprolol  5 mg Intravenous Once  . ondansetron (ZOFRAN) IV  4 mg Intravenous Once  . sodium chloride  1,000 mL Intravenous Once  . sodium chloride  3 mL Intravenous Q12H  . DISCONTD: Calcium Carbonate-Vitamin D  1 tablet Oral Daily  . DISCONTD: Cranberry  1 capsule Oral BID  . DISCONTD: diltiazem  10 mg Intravenous Once  . DISCONTD: Ensure Plus  237 mL Oral TID BM  . DISCONTD: metoprolol  2.5 mg Intravenous Once   Continuous Infusions:    . diltiazem (CARDIZEM) infusion 15 mg/hr (06/23/11 0343)      LOS: 1 day   Hillery Aldo, MD Pager [phone]  06/23/2011, 8:08 AM

## 2011-06-23 NOTE — Evaluation (Signed)
Physical Therapy Evaluation Patient Details Name: Stacey Huang MRN: [id number] DOB: 04-22-1922 Today's Date: 06/23/2011 Time: 1410-1436 PT Time Calculation (min): 26 min  PT Assessment / Plan / Recommendation Clinical Impression  78 yo female with advanced dementia and recent humeral fracture admitted with afib and RVR.  Pt continued to need total assist for all mobility and has difficulty maintaining sling and swath on L arm. RN has contacted ortho techs about another immobilizing sling and swath for arm. Question if ALF will be able to provide the level of care that pt needs.  She will benefit from continued PT 5x per week at SNF to improve automatic movementst to decrease burden of care    PT Assessment  Patient needs continued PT services    Follow Up Recommendations  Skilled nursing facility;Supervision/Assistance - 24 hour    Barriers to Discharge        lEquipment Recommendations  Defer to next venue    Recommendations for Other Services     Frequency Min 3X/week    Precautions / Restrictions Precautions Precautions: Fall Required Braces or Orthoses: Other Brace/Splint Other Brace/Splint: left shoulder sling and swath on left arm that is not fitting properly Restrictions Weight Bearing Restrictions: Yes LUE Weight Bearing: Non weight bearing Other Position/Activity Restrictions: Shoulder immobilizer on L UE.     Pertinent Vitals/Pain Pt sleepy today      Mobility  Bed Mobility Bed Mobility: Rolling Right;Right Sidelying to Sit Rolling Right: 1: +2 Total assist Rolling Right: Patient Percentage: 10% Right Sidelying to Sit: 1: +2 Total assist Right Sidelying to Sit: Patient Percentage: 10% Details for Bed Mobility Assistance: pt asleep upon entering room with sling/swath on arm, but not immobilizing shoulder. Pt needs+2 tot assist to move to siting on EOB Transfers Transfers: Squat Pivot Transfers Squat Pivot Transfers: 1: +2 Total assist Squat Pivot Transfers:  Patient Percentage: 10% Details for Transfer Assistance: pt lifted up from bed with pad to initate squat pivot to chair.  She was able to bear minimal weight on legs.  Pt was more awake once up in chair Ambulation/Gait Stairs: No Wheelchair Mobility Wheelchair Mobility: No    Exercises     PT Diagnosis: Generalized weakness;Acute pain;Difficulty walking;Altered mental status  PT Problem List: Decreased strength;Decreased range of motion;Decreased activity tolerance;Decreased mobility;Decreased balance;Decreased cognition;Decreased knowledge of use of DME;Decreased safety awareness;Decreased knowledge of precautions;Cardiopulmonary status limiting activity;Pain PT Treatment Interventions: Gait training;Functional mobility training;Therapeutic activities;Neuromuscular re-education;Patient/family education   PT Goals Acute Rehab PT Goals PT Goal Formulation: Patient unable to participate in goal setting Time For Goal Achievement: 07/07/11 Potential to Achieve Goals: Fair Pt will Roll Supine to Right Side: with mod assist PT Goal: Rolling Supine to Right Side - Progress: Goal set today Pt will go Supine/Side to Sit: with mod assist PT Goal: Supine/Side to Sit - Progress: Goal set today Pt will Sit at Edge of Bed: with supervision;1-2 min;with unilateral upper extremity support PT Goal: Sit at Edge Of Bed - Progress: Goal set today Pt will Transfer Bed to Chair/Chair to Bed: with mod assist PT Transfer Goal: Bed to Chair/Chair to Bed - Progress: Goal set today  Visit Information  Last PT Received On: 06/23/11 Assistance Needed: +2    Subjective Data  Subjective: pt initially asleep, but awakens to stimulation Patient Stated Goal: pt unable to state goals due to Altered mental status.    Prior Functioning  Home Living Lives With: Other (Comment) Available Help at Discharge:  (ALF memory care unit) Type of  Home: Assisted living    Cognition  Overall Cognitive Status: History of  cognitive impairments - at baseline Area of Impairment: Attention;Memory;Following commands;Safety/judgement;Awareness of errors Arousal/Alertness: Lethargic Orientation Level: Disoriented X4 Behavior During Session: Lethargic Memory: Decreased recall of precautions Following Commands: Other (comment) (does not follow commands) Safety/Judgement: Decreased awareness of safety precautions;Decreased safety judgement for tasks assessed;Decreased awareness of need for assistance Awareness of Errors: Assistance required to identify errors made;Assistance required to correct errors made Cognition - Other Comments: pt with history of advanced dementia    Extremity/Trunk Assessment Right Upper Extremity Assessment RUE ROM/Strength/Tone: [facility] for tasks assessed Left Upper Extremity Assessment LUE ROM/Strength/Tone: Unable to fully assess;Due to pain;Deficits LUE ROM/Strength/Tone Deficits: pt with recent comminuted humeral fracture Right Lower Extremity Assessment RLE ROM/Strength/Tone Deficits: generalized weakness in bilateral LEs with muscle atrophy.  Left Lower Extremity Assessment LLE ROM/Strength/Tone: Deficits LLE ROM/Strength/Tone Deficits: generalized weakness in bilateral LEs  Trunk Assessment Trunk Assessment: Kyphotic   Balance Balance Balance Assessed: Yes Static Sitting Balance Static Sitting - Balance Support: Feet supported;Right upper extremity supported Static Sitting - Level of Assistance: 2: Max assist Static Sitting - Comment/# of Minutes: pt had difficulty maintaining sitting in upright on EOB  End of Session PT - End of Session Activity Tolerance: Patient limited by fatigue Patient left: in chair;with nursing in room Nurse Communication: Need for lift equipment;Mobility status   Donnetta Hail 06/23/2011, 4:20 PM

## 2011-06-23 NOTE — Progress Notes (Signed)
  Echocardiogram 2D Echocardiogram has been performed.  Jorje Guild 06/23/2011, 8:48 AM

## 2011-06-23 NOTE — Progress Notes (Signed)
PHARMACIST - PHYSICIAN ORDER COMMUNICATION  CONCERNING: P&T Medication Policy on Herbal Medications  DESCRIPTION:  This patient's order for:  Cranberry  has been noted.  This product(s) is classified as an "herbal" or natural product. Due to a lack of definitive safety studies or FDA approval, nonstandard manufacturing practices, plus the potential risk of unknown drug-drug interactions while on inpatient medications, the Pharmacy and Therapeutics Committee does not permit the use of "herbal" or natural products of this type within [place].   ACTION TAKEN: The pharmacy department is unable to verify this order at this time and your patient has been informed of this safety policy. Please reevaluate patient's clinical condition at discharge and address if the herbal or natural product(s) should be resumed at that time.   Creed Kail, PharmD 

## 2011-06-23 NOTE — Progress Notes (Signed)
Pt given 0.5 mg iv ativan for increased agitatio, per orders received. Conley Rolls

## 2011-06-23 NOTE — Plan of Care (Signed)
Problem: Phase I Progression Outcomes Goal: Heart rate or rhythm control medication Outcome: Completed/Met Date Met:  06/23/11 Cardizem Gtt

## 2011-06-24 DIAGNOSIS — K219 Gastro-esophageal reflux disease without esophagitis: Secondary | ICD-10-CM

## 2011-06-24 DIAGNOSIS — F0390 Unspecified dementia without behavioral disturbance: Secondary | ICD-10-CM

## 2011-06-24 DIAGNOSIS — S42209A Unspecified fracture of upper end of unspecified humerus, initial encounter for closed fracture: Secondary | ICD-10-CM

## 2011-06-24 DIAGNOSIS — I4891 Unspecified atrial fibrillation: Secondary | ICD-10-CM

## 2011-06-24 MED ORDER — ACETAMINOPHEN 325 MG PO TABS
650.0000 mg | ORAL_TABLET | Freq: Four times a day (QID) | ORAL | Status: DC
  Administered 2011-06-24 – 2011-06-25 (×5): 650 mg via ORAL
  Filled 2011-06-24 (×7): qty 2

## 2011-06-24 MED ORDER — POTASSIUM CHLORIDE 20 MEQ/15ML (10%) PO LIQD
20.0000 meq | Freq: Two times a day (BID) | ORAL | Status: DC
  Administered 2011-06-24 – 2011-06-25 (×2): 20 meq via ORAL
  Filled 2011-06-24 (×3): qty 15

## 2011-06-24 MED ORDER — CELECOXIB 100 MG PO CAPS: 100.0000 mg | ORAL_CAPSULE | Freq: Two times a day (BID) | ORAL | Status: DC

## 2011-06-24 MED ORDER — FAMOTIDINE 20 MG PO TABS: 20.0000 mg | ORAL_TABLET | Freq: Every day | ORAL | Status: DC

## 2011-06-24 NOTE — Progress Notes (Signed)
CSW attempted to meet with Pt concerning d/c plan. Pt unable to communicate at this time.   Weekday CSW to f/u.  Leron Croak, LCSWA [employer] Coverage [phone]

## 2011-06-24 NOTE — Progress Notes (Signed)
After pain medication, HR decreased to 90-110. Pt resting comfortably. Will continue to monitor.  Earnest Conroy. Clelia Croft, RN

## 2011-06-24 NOTE — Progress Notes (Signed)
PROGRESS NOTE  Stacey Huang EAV:[id number] DOB: 30-Nov-1922 DOA: 06/22/2011 PCP: Florentina Jenny, MD  Brief narrative: Stacey Huang is an 78 year old female with a past medical history of atrial fibrillation, not on chronic Coumadin, recent left humeral fracture and dementia, who was brought to the hospital with tachycardia. Upon initial evaluation emergency department, the patient was found to be in atrial fibrillation with rapid ventricular response and was placed on a Cardizem drip.  Interim History: Stable overnight.     Assessment/Plan: Principal Problem:  *Atrial fibrillation with RVR  Heart rate stable after cardizem weaned and oral therapy resumed.  Cardiac markers negative x3 sets.  Continue aspirin, apparently not a Coumadin candidate due to fall risk.  2 D Echo showed EF 55-60% with no regional wall motion abnormalities.  ? If pain is contributing to elevated HR (RN noted improvement in HR after pain medicine given.  Will start scheduled Tylenol.  HR still suboptimal, increase atenolol to 25 mg Q 12 hours. Active Problems:  T11 compression fracture  Continue calcitonin, calcium, and vitamin D.  Hypokalemia  Replace. Goal potassium level = 4.0  Alzheimer's dementia  Continue Namenda.  Shoulder fracture, left  Continue sling and Ultram as needed for pain control.  Normocytic anemia/MGUS  Blood counts stable.   Code Status: DNR Family Communication: None at bedside,  son "[name]" unable to be reached at various phone numbers listed. Disposition Plan: SNF when stable.  Medical Consultants:  None  Other consultants:  Physical therapy: SNF versus home PT with 24 hour supervision.  Antibiotics:  None   Subjective  Stacey Huang is pleasantly confused.  She is unable to vocalize specific symptoms (advanced dementia).   Objective   Objective: Filed Vitals:   06/23/11 2156 06/23/11 2244 06/24/11 0218 06/24/11 0500  BP: 128/75  142/72 122/63  Pulse: 82   122 105  Temp: 97.4 F (36.3 C)   98.3 F (36.8 C)  TempSrc: Oral   Oral  Resp: 16   18  Height:      Weight:    53.5 kg (117 lb 15.1 oz)  SpO2: 93% 100% 100% 96%    Intake/Output Summary (Last 24 hours) at 06/24/11 1040 Last data filed at 06/24/11 0700  Gross per 24 hour  Intake    400 ml  Output   1000 ml  Net   -600 ml    Exam: Gen:  NAD Cardiovascular:  HSIR Respiratory: Lungs CTAB Gastrointestinal: Abdomen soft, NT/ND with normal active bowel sounds. Extremities: No C/E/C    Data Reviewed: Basic Metabolic Panel:  Lab 06/23/11 7829 06/22/11 1740 06/18/11 0515  NA 141 142 134*  K 3.3* 3.6 --  CL 107 106 97  CO2 26 27 28   GLUCOSE 98 103* 142*  BUN 15 25* 26*  CREATININE 0.51 0.56 0.72  CALCIUM 8.7 9.0 9.4  MG -- -- --  PHOS -- -- --   GFR Estimated Creatinine Clearance: 42.6 ml/min (by C-G formula based on Cr of 0.51). Liver Function Tests:  Lab 06/23/11 0400  AST 19  ALT 18  ALKPHOS 83  BILITOT 0.4  PROT 6.4  ALBUMIN 2.7*   CBC:  Lab 06/23/11 0400 06/22/11 1740  WBC 8.3 9.2  NEUTROABS 4.8 --  HGB 9.5* 10.6*  HCT 29.8* 33.2*  MCV 91.7 91.7  PLT 312 357   Cardiac Enzymes:  Lab 06/23/11 1007 06/23/11 0400 06/22/11 2215 06/22/11 1740  CKTOTAL 42 44 43 --  CKMB 3.5 3.5 3.7 --  CKMBINDEX -- -- -- --  TROPONINI <0.30 <0.30 <0.30 <0.30   Microbiology Recent Results (from the past 240 hour(s))  MRSA PCR SCREENING     Status: Normal   Collection Time   06/23/11  1:20 AM      Component Value Range Status Comment   MRSA by PCR NEGATIVE  NEGATIVE Final     Procedures and Diagnostic Studies:  Dg Chest Portable 1 View 06/22/2011 IMPRESSION: Cardiac enlargement but no acute pulmonary findings.  Original Report Authenticated By: P. Loralie Champagne, M.D.   Scheduled Meds:    . aspirin  325 mg Oral Daily  . atenolol  25 mg Oral Daily  . calcitonin (salmon)  1 spray Alternating Nares Daily  . calcium-vitamin D  1 tablet Oral Daily  .  cholecalciferol  1,000 Units Oral Daily  . diltiazem  240 mg Oral Daily  . feeding supplement  237 mL Oral TID BM  . memantine  5 mg Oral BID  . potassium chloride  20 mEq Oral BID  . sodium chloride  3 mL Intravenous Q12H  . DISCONTD: diltiazem  10 mg Intravenous Once   Continuous Infusions:    . DISCONTD: diltiazem (CARDIZEM) infusion Stopped (06/23/11 1126)      LOS: 2 days   Hillery Aldo, MD Pager [phone]  06/24/2011, 10:40 AM

## 2011-06-24 NOTE — Progress Notes (Signed)
Pt's HR 110-120. Pt appeared to convert to ST on tele monitor, EKG still reading A.Flutter. MD notified. Pt subjectively appears to be in pain, will give prn pain medication and monitor HR. Orders to call MD if HR sustaining in 120's. Earnest Conroy. Clelia Croft, RN

## 2011-06-25 DIAGNOSIS — I4891 Unspecified atrial fibrillation: Secondary | ICD-10-CM

## 2011-06-25 DIAGNOSIS — F0390 Unspecified dementia without behavioral disturbance: Secondary | ICD-10-CM

## 2011-06-25 DIAGNOSIS — K219 Gastro-esophageal reflux disease without esophagitis: Secondary | ICD-10-CM

## 2011-06-25 DIAGNOSIS — S42209A Unspecified fracture of upper end of unspecified humerus, initial encounter for closed fracture: Secondary | ICD-10-CM

## 2011-06-25 LAB — BASIC METABOLIC PANEL
BUN: 22 mg/dL (ref 6–23)
Calcium: 9.1 mg/dL (ref 8.4–10.5)
Creatinine, Ser: 0.6 mg/dL (ref 0.50–1.10)
GFR calc non Af Amer: 80 mL/min — ABNORMAL LOW (ref >90)
Glucose, Bld: 86 mg/dL (ref 70–99)
Sodium: 137 mEq/L (ref 135–145)

## 2011-06-25 LAB — CBC
MCH: 29.1 pg (ref 26.0–34.0)
MCHC: 32.2 g/dL (ref 30.0–36.0)
MCV: 90.4 fL (ref 78.0–100.0)
Platelets: 357 10*3/uL (ref 150–400)
RBC: 3.44 MIL/uL — ABNORMAL LOW (ref 3.87–5.11)
RDW: 14.9 % (ref 11.5–15.5)

## 2011-06-25 MED ORDER — DILTIAZEM HCL ER COATED BEADS 240 MG PO CP24: 240.0000 mg | ORAL_CAPSULE | Freq: Every day | ORAL | Status: DC

## 2011-06-25 MED ORDER — ACETAMINOPHEN 325 MG PO TABS: 650.0000 mg | ORAL_TABLET | Freq: Four times a day (QID) | ORAL | Status: DC

## 2011-06-25 NOTE — Progress Notes (Addendum)
Patient cleared for return to [facility]. FL2 completed and faxed. Patients son to pick up patient and transport. Packet copied and placed in [place].  Siomara Burkel C. Braylynn Lewing MSW, [place] [phone] Patient is too high level of care for ALF, needs SNF. Spoke with son, he is agreeable to [facility]. Patient accepted. Son would like to transport.  Akirah Storck C. Doninique Lwin MSW, LCSW [phone]

## 2011-06-25 NOTE — Discharge Instructions (Signed)
Atrial Fibrillation Your caregiver has diagnosed you with atrial fibrillation (AFib). The heart normally beats very regularly; AFib is a type of irregular heartbeat. The heart rate may be faster or slower than normal. This can prevent your heart from pumping as well as it should. AFib can be constant (chronic) or intermittent (paroxysmal). CAUSES  Atrial fibrillation may be caused by:  Heart disease, including heart attack, coronary artery disease, heart failure, diseases of the heart valves, and others.   Blood clot in the lungs (pulmonary embolism).   Pneumonia or other infections.   Chronic lung disease.   Thyroid disease.   Toxins. These include alcohol, some medications (such as decongestant medications or diet pills), and caffeine.  In some people, no cause for AFib can be found. This is referred to as Lone Atrial Fibrillation. SYMPTOMS   Palpitations or a fluttering in your chest.   A vague sense of chest discomfort.   Shortness of breath.   Sudden onset of lightheadedness or weakness.  Sometimes, the first sign of AFib can be a complication of the condition. This could be a stroke or heart failure. DIAGNOSIS  Your description of your condition may make your caregiver suspicious of atrial fibrillation. Your caregiver will examine your pulse to determine if fibrillation is present. An EKG (electrocardiogram) will confirm the diagnosis. Further testing may help determine what caused you to have atrial fibrillation. This may include chest x-ray, echocardiogram, blood tests, or CT scans. PREVENTION  If you have previously had atrial fibrillation, your caregiver may advise you to avoid substances known to cause the condition (such as stimulant medications, and possibly caffeine or alcohol). You may be advised to use medications to prevent recurrence. Proper treatment of any underlying condition is important to help prevent recurrence. PROGNOSIS  Atrial fibrillation does tend to  become a chronic condition over time. It can cause significant complications (see below). Atrial fibrillation is not usually immediately life-threatening, but it can shorten your life expectancy. This seems to be worse in women. If you have lone atrial fibrillation and are under 60 years old, the risk of complications is very low, and life expectancy is not shortened. RISKS AND COMPLICATIONS  Complications of atrial fibrillation can include stroke, chest pain, and heart failure. Your caregiver will recommend treatments for the atrial fibrillation, as well as for any underlying conditions, to help minimize risk of complications. TREATMENT  Treatment for AFib is divided into several categories:  Treatment of any underlying condition.   Converting you out of AFib into a regular (sinus) rhythm.   Controlling rapid heart rate.   Prevention of blood clots and stroke.  Medications and procedures are available to convert your atrial fibrillation to sinus rhythm. However, recent studies have shown that this may not offer you any advantage, and cardiac experts are continuing research and debate on this topic. More important is controlling your rapid heartbeat. The rapid heartbeat causes more symptoms, and places strain on your heart. Your caregiver will advise you on the use of medications that can control your heart rate. Atrial fibrillation is a strong stroke risk. You can lessen this risk by taking blood thinning medications such as Coumadin (warfarin), or sometimes aspirin. These medications need close monitoring by your caregiver. Over-medication can cause bleeding. Too little medication may not protect against stroke. HOME CARE INSTRUCTIONS   If your caregiver prescribed medicine to make your heartbeat more normally, take as directed.   If blood thinners were prescribed by your caregiver, take EXACTLY as directed.     Perform blood tests EXACTLY as directed.   Quit smoking. Smoking increases your  cardiac and lung (pulmonary) risks.   DO NOT drink alcohol.   DO NOT drink caffeinated drinks (e.g. coffee, soda, chocolate, and leaf teas). You may drink decaffeinated coffee, soda or tea.   If you are overweight, you should choose a reduced calorie diet to lose weight. Please see a registered dietitian if you need more information about healthy weight loss. DO NOT USE DIET PILLS as they may aggravate heart problems.   If you have other heart problems that are causing AFib, you may need to eat a low salt, fat, and cholesterol diet. Your caregiver will tell you if this is necessary.   Exercise every day to improve your physical fitness. Stay active unless advised otherwise.   If your caregiver has given you a follow-up appointment, it is very important to keep that appointment. Not keeping the appointment could result in heart failure or stroke. If there is any problem keeping the appointment, you must call back to this facility for assistance.  SEEK MEDICAL CARE IF:  You notice a change in the rate, rhythm or strength of your heartbeat.   You develop an infection or any other change in your overall health status.  SEEK IMMEDIATE MEDICAL CARE IF:   You develop chest pain, abdominal pain, sweating, weakness or feel sick to your stomach (nausea).   You develop shortness of breath.   You develop swollen feet and ankles.   You develop dizziness, numbness, or weakness of your face or limbs, or any change in vision or speech.  MAKE SURE YOU:   Understand these instructions.   Will watch your condition.   Will get help right away if you are not doing well or get worse.  Document Released: 12/18/2004 Document Revised: 12/07/2010 Document Reviewed: 07/23/2007 Exit[employer] Patient Information 2012 [employer]. 

## 2011-06-25 NOTE — Progress Notes (Signed)
Report called to Jasmine [employer]er) at [facility] facility. Ginny Forth

## 2011-06-25 NOTE — Progress Notes (Signed)
Patient has a SNF bed available at [facility] per [place] CSW. Patient will still be d/c'ed today. Await son getting paperwork signed and this RN will call report in approximately one hour per CSW recommendations Ginny Forth

## 2011-06-25 NOTE — Progress Notes (Signed)
Attempted to call report to ALF facility. Spoke with Britta Mccreedy (Lead [facility] Med Tech) who quickly asked that I speak with a nurse. Then I spoke with Jacki Cones, RN, Health and Wellness Director who was concerned that patient has exceeded their level of care needs in light of needing lift equipment to get out of bed. Noted that PT recommendation from 2 days ago was for SNF. CSW [place] notified and PT to room within 30 mintues to re-evaluate with same recommendation. I notified patients son reagarding change of plans for patient to go to SNF now.Ginny Forth

## 2011-06-25 NOTE — Progress Notes (Signed)
Physical Therapy Treatment Patient Details Name: Stacey Huang MRN: [id number] DOB: [birth year] Today's Date: 06/25/2011 Time: 6578-4696 PT Time Calculation (min): 34 min  PT Assessment / Plan / Recommendation Comments on Treatment Session  Pt requiring max verbal cuing for attending to task, following verbal commands and +2 assist for all mobilty.  Continue to recommend SNF for folllow up therapy at D/C for increased safety and decreased burden of care.     Follow Up Recommendations  Skilled nursing facility;Supervision/Assistance - 24 hour    Barriers to Discharge        Equipment Recommendations  Defer to next venue    Recommendations for Other Services    Frequency Min 3X/week   Plan Discharge plan remains appropriate    Precautions / Restrictions Precautions Precautions: Fall Required Braces or Orthoses: Other Brace/Splint Other Brace/Splint: left shoulder sling and swath on left arm that is not fitting properly Restrictions Weight Bearing Restrictions: Yes LUE Weight Bearing: Non weight bearing Other Position/Activity Restrictions: Shoulder immobilizer on L UE.     Pertinent Vitals/Pain 5/10 PAINAD    Mobility  Bed Mobility Bed Mobility: Supine to Sit;Sitting - Scoot to Edge of Bed Supine to Sit: 1: +2 Total assist;HOB elevated Supine to Sit: Patient Percentage: 10% Sitting - Scoot to Edge of Bed: 1: +2 Total assist Sitting - Scoot to Edge of Bed: Patient Percentage: 10% Details for Bed Mobility Assistance: Pt requires assist for B LE off of bed and for trunk to attain sitting position on EOB.  Provided cues for technique, however pt very restless and unable to follow commands.  Transfers Transfers: Squat Pivot Transfers Sit to Stand: 1: +2 Total assist;From bed;From chair/3-in-1 Sit to Stand: Patient Percentage: 10% Stand to Sit: 1: +2 Total assist;To chair/3-in-1 Stand to Sit: Patient Percentage: 20% Squat Pivot Transfers: 1: +2 Total assist Squat Pivot  Transfers: Patient Percentage: 10% Details for Transfer Assistance: Pt requires +2 assist to rise from bed with PT blocked B LE due to pt wanting to extend both knees when rising from bed/chair.  Max verbal and manual cuing for upright posture, quad and glute activation to attain upright posture.      Exercises     PT Diagnosis:    PT Problem List:   PT Treatment Interventions:     PT Goals Acute Rehab PT Goals PT Goal Formulation: Patient unable to participate in goal setting Time For Goal Achievement: 07/07/11 Potential to Achieve Goals: Fair Pt will go Supine/Side to Sit: with mod assist PT Goal: Supine/Side to Sit - Progress: Not progressing Pt will Sit at Edge of Bed: with supervision;1-2 min;with unilateral upper extremity support PT Goal: Sit at Edge Of Bed - Progress: Progressing toward goal Pt will go Sit to Stand: with mod assist PT Goal: Sit to Stand - Progress: Not progressing Pt will go Stand to Sit: with min assist PT Goal: Stand to Sit - Progress: Not progressing Pt will Transfer Bed to Chair/Chair to Bed: with mod assist PT Transfer Goal: Bed to Chair/Chair to Bed - Progress: Not progressing  Visit Information  Last PT Received On: 06/25/11 Assistance Needed: +2    Subjective Data  Subjective: Pt nonverbal during session, did answer some questions with yes/no. Patient Stated Goal: pt unable to state goals due to Altered mental status.    Cognition  Overall Cognitive Status: History of cognitive impairments - at baseline Arousal/Alertness: Awake/alert Orientation Level: Disoriented X4 Behavior During Session: Restless Memory: Decreased recall of precautions Following Commands: Follows one  step commands inconsistently Safety/Judgement: Decreased awareness of safety precautions;Decreased safety judgement for tasks assessed;Decreased awareness of need for assistance Awareness of Errors: Assistance required to identify errors made;Assistance required to correct  errors made Cognition - Other Comments: pt with history of advanced dementia    Balance  Balance Balance Assessed: Yes Static Sitting Balance Static Sitting - Balance Support: Feet supported;Right upper extremity supported Static Sitting - Level of Assistance: 5: Stand by assistance Static Sitting - Comment/# of Minutes: Initially required max assist to sit on EOB, however transitioned to stand by assist for safety.   End of Session PT - End of Session Equipment Utilized During Treatment: Gait belt;Other (comment) Activity Tolerance: Patient limited by fatigue Patient left: in chair;with call bell/phone within reach;with chair alarm set Nurse Communication: Mobility status;Need for lift equipment    PageMeribeth Mattes 06/25/2011, 3:55 PM

## 2011-06-25 NOTE — Discharge Summary (Signed)
Physician Discharge Summary  Patient ID: Stacey Huang MRN: [id number] DOB/AGE: 1922/03/20 78 y.o.  Admit date: 06/22/2011 Discharge date: 06/25/2011  Primary Care Physician:  Stacey Jenny, MD   Discharge Diagnoses:    Principal Problem:  *Atrial fibrillation with RVR  Active Problems:   Hypokalemia   Alzheimer's dementia   Shoulder fracture, left   Normocytic anemia   Compression fracture of T11 vertebra   MGUS (monoclonal gammopathy of unknown significance)    Discharge Medications:  Medication List  As of 06/25/2011  9:47 AM   STOP taking these medications         atenolol 25 MG tablet      diltiazem 60 MG tablet         TAKE these medications         acetaminophen 325 MG tablet   Commonly known as: TYLENOL   Take 2 tablets (650 mg total) by mouth 4 (four) times daily.      aspirin 325 MG tablet   Take 325 mg by mouth daily.      calcitonin (salmon) 200 UNIT/ACT nasal spray   Commonly known as: MIACALCIN/FORTICAL   Place 1 spray into the nose daily.      Calcium 600+D 600-400 MG-UNIT per tablet   Generic drug: Calcium Carbonate-Vitamin D   Take 1 tablet by mouth daily.      cholecalciferol 1000 UNITS tablet   Commonly known as: VITAMIN D   Take 1,000 Units by mouth daily.      Cranberry 500 MG Caps   Take 1 capsule by mouth 2 (two) times daily.      diltiazem 240 MG 24 hr capsule   Commonly known as: CARDIZEM CD   Take 1 capsule (240 mg total) by mouth daily.      Ensure Plus Liqd   Take 237 mLs by mouth daily at 12 noon.      memantine 5 MG tablet   Commonly known as: NAMENDA   Take 5 mg by mouth 2 (two) times daily.      metoprolol tartrate 25 MG tablet   Commonly known as: LOPRESSOR   Take 25 mg by mouth 2 (two) times daily.      traMADol 50 MG tablet   Commonly known as: ULTRAM   Take 50 mg by mouth every 4 (four) hours as needed. For pain             Disposition and Follow-up: The patient is being discharged back to her  SNF.  She should follow up with her PCP in 1 week.   Medical Consults:  None  Other Consults:  Physical therapy: SNF versus home PT with 24 hour supervision.  Procedures and Diagnostic Studies:   Dg Chest Portable 1 View  06/22/2011  *RADIOLOGY REPORT*  Clinical Data: Tachy arrhythmia  PORTABLE CHEST - 1 VIEW  Comparison: 06/12/2011.  Findings: The heart is enlarged but stable.  No acute pulmonary findings.  No pleural effusion.  No pneumothorax.  No definite rib fractures.  The left humeral head/neck fracture is noted.  IMPRESSION: Cardiac enlargement but no acute pulmonary findings.  Original Report Authenticated By: Stacey Huang, M.D.    Discharge Laboratory Values: Basic Metabolic Panel:  Lab 06/25/11 4098 06/23/11 0400 06/22/11 1740  NA 137 141 142  K 4.4 3.3* --  CL 103 107 106  CO2 28 26 27   GLUCOSE 86 98 103*  BUN 22 15 25*  CREATININE 0.60 0.51 0.56  CALCIUM 9.1 8.7  9.0  MG -- -- --  PHOS -- -- --   GFR Estimated Creatinine Clearance: 44.3 ml/min (by C-G formula based on Cr of 0.6). Liver Function Tests:  Lab 06/23/11 0400  AST 19  ALT 18  ALKPHOS 83  BILITOT 0.4  PROT 6.4  ALBUMIN 2.7*    CBC:  Lab 06/25/11 0428 06/23/11 0400 06/22/11 1740  WBC 6.6 8.3 9.2  NEUTROABS -- 4.8 --  HGB 10.0* 9.5* 10.6*  HCT 31.1* 29.8* 33.2*  MCV 90.4 91.7 91.7  PLT 357 312 357   Cardiac Enzymes:  Lab 06/23/11 1007 06/23/11 0400 06/22/11 2215 06/22/11 1740  CKTOTAL 42 44 43 --  CKMB 3.5 3.5 3.7 --  CKMBINDEX -- -- -- --  TROPONINI <0.30 <0.30 <0.30 <0.30   Thyroid function studies  Basename 06/23/11 0400  TSH 0.774  T4TOTAL --  T3FREE --  THYROIDAB --   Microbiology Recent Results (from the past 240 hour(s))  MRSA PCR SCREENING     Status: Normal   Collection Time   06/23/11  1:20 AM      Component Value Range Status Comment   MRSA by PCR NEGATIVE  NEGATIVE Final      Brief H and P: For complete details please refer to admission H and P, but  in brief, Stacey Huang is an 78 year old female with a past medical history of atrial fibrillation, not on chronic Coumadin, recent left humeral fracture and dementia, who was brought to the hospital with tachycardia. Upon initial evaluation emergency department, the patient was found to be in atrial fibrillation with rapid ventricular response and was placed on a Cardizem drip.    Physical Exam at Discharge: BP 138/62  Pulse 77  Temp 97.5 F (36.4 C) (Oral)  Resp 18  Ht 5\' 5"  (1.651 m)  Wt 55.6 kg (122 lb 9.2 oz)  BMI 20.40 kg/m2  SpO2 96% Gen: NAD  Cardiovascular: HSIR  Respiratory: Lungs CTAB  Gastrointestinal: Abdomen soft, NT/ND with normal active bowel sounds.  Extremities: No C/E/C  Hospital Course:  Principal Problem:  *Atrial fibrillation with RVR  Heart rate stable after cardizem weaned and oral therapy resumed. Continue BID beta blocker. Cardiac markers negative x3 sets.  Continue aspirin, apparently not a Coumadin candidate due to fall risk.  2 D Echo showed EF 55-60% with no regional wall motion abnormalities.  ? If pain is contributing to elevated HR (RN noted improvement in HR after pain medicine given.  Started on scheduled Tylenol for pain control.  Active Problems:  T11 compression fracture  Continue calcitonin, calcium, and vitamin D. Hypokalemia  Replaced. Goal potassium level = 4.0 Alzheimer's dementia  Continue Namenda. Shoulder fracture, left  Continue sling and Ultram as needed for breakthrough pain control. Normocytic anemia/MGUS  Blood counts stable.  Diet:  Low sodium, heart healthy  Activity:  Increase activity slowly  Condition at Discharge:   Stable  Time spent on Discharge:  35 minutes  Signed: Dr. Trula Ore Chandria Huang Pager [phone] 06/25/2011, 9:47 AM

## 2011-06-25 NOTE — Progress Notes (Signed)
Spoke with Dottie at the ALF to notify her of change in patients need for ALF and now will get a SNF bed. Son [name] also notified by me of this change in plans. Ginny Forth

## 2011-08-08 ENCOUNTER — Encounter (HOSPITAL_COMMUNITY): Payer: Self-pay | Admitting: *Deleted

## 2011-08-08 ENCOUNTER — Emergency Department (HOSPITAL_COMMUNITY): Payer: Medicare Other

## 2011-08-08 ENCOUNTER — Inpatient Hospital Stay (HOSPITAL_COMMUNITY)
Admission: EM | Admit: 2011-08-08 | Discharge: 2011-08-13 | DRG: 309 | Disposition: A | Discharge status: Nursing Home (Includes ICF) | Payer: Medicare Other | Attending: Internal Medicine | Admitting: Internal Medicine

## 2011-08-08 DIAGNOSIS — K219 Gastro-esophageal reflux disease without esophagitis: Secondary | ICD-10-CM

## 2011-08-08 DIAGNOSIS — S42209A Unspecified fracture of upper end of unspecified humerus, initial encounter for closed fracture: Secondary | ICD-10-CM

## 2011-08-08 DIAGNOSIS — S4292XA Fracture of left shoulder girdle, part unspecified, initial encounter for closed fracture: Secondary | ICD-10-CM | POA: Diagnosis present

## 2011-08-08 DIAGNOSIS — G309 Alzheimer's disease, unspecified: Secondary | ICD-10-CM | POA: Diagnosis present

## 2011-08-08 DIAGNOSIS — S0033XA Contusion of nose, initial encounter: Secondary | ICD-10-CM

## 2011-08-08 DIAGNOSIS — R55 Syncope and collapse: Secondary | ICD-10-CM | POA: Diagnosis present

## 2011-08-08 DIAGNOSIS — E876 Hypokalemia: Secondary | ICD-10-CM

## 2011-08-08 DIAGNOSIS — I4891 Unspecified atrial fibrillation: Principal | ICD-10-CM

## 2011-08-08 DIAGNOSIS — N390 Urinary tract infection, site not specified: Secondary | ICD-10-CM

## 2011-08-08 DIAGNOSIS — F068 Other specified mental disorders due to known physiological condition: Secondary | ICD-10-CM

## 2011-08-08 DIAGNOSIS — D649 Anemia, unspecified: Secondary | ICD-10-CM

## 2011-08-08 DIAGNOSIS — F0280 Dementia in other diseases classified elsewhere without behavioral disturbance: Secondary | ICD-10-CM

## 2011-08-08 DIAGNOSIS — F0390 Unspecified dementia without behavioral disturbance: Secondary | ICD-10-CM

## 2011-08-08 DIAGNOSIS — Z9181 History of falling: Secondary | ICD-10-CM

## 2011-08-08 DIAGNOSIS — D472 Monoclonal gammopathy: Secondary | ICD-10-CM

## 2011-08-08 DIAGNOSIS — Z66 Do not resuscitate: Secondary | ICD-10-CM | POA: Diagnosis present

## 2011-08-08 DIAGNOSIS — S22080A Wedge compression fracture of T11-T12 vertebra, initial encounter for closed fracture: Secondary | ICD-10-CM

## 2011-08-08 DIAGNOSIS — S42309D Unspecified fracture of shaft of humerus, unspecified arm, subsequent encounter for fracture with routine healing: Secondary | ICD-10-CM

## 2011-08-08 DIAGNOSIS — A498 Other bacterial infections of unspecified site: Secondary | ICD-10-CM | POA: Diagnosis present

## 2011-08-08 LAB — URINALYSIS, ROUTINE W REFLEX MICROSCOPIC
Bilirubin Urine: NEGATIVE
Glucose, UA: NEGATIVE mg/dL
Hgb urine dipstick: NEGATIVE
Ketones, ur: NEGATIVE mg/dL
Protein, ur: NEGATIVE mg/dL
Urobilinogen, UA: 1 mg/dL (ref 0.0–1.0)

## 2011-08-08 LAB — CBC WITH DIFFERENTIAL/PLATELET
Eosinophils Absolute: 0.2 10*3/uL (ref 0.0–0.7)
Eosinophils Relative: 2 % (ref 0–5)
HCT: 33 % — ABNORMAL LOW (ref 36.0–46.0)
Lymphs Abs: 1.5 10*3/uL (ref 0.7–4.0)
MCH: 28.2 pg (ref 26.0–34.0)
MCV: 87.8 fL (ref 78.0–100.0)
Monocytes Absolute: 0.7 10*3/uL (ref 0.1–1.0)
Platelets: 308 10*3/uL (ref 150–400)
RBC: 3.76 MIL/uL — ABNORMAL LOW (ref 3.87–5.11)
RDW: 14.9 % (ref 11.5–15.5)

## 2011-08-08 LAB — BASIC METABOLIC PANEL
CO2: 28 mEq/L (ref 19–32)
Calcium: 9.5 mg/dL (ref 8.4–10.5)
Creatinine, Ser: 0.7 mg/dL (ref 0.50–1.10)
GFR calc non Af Amer: 76 mL/min — ABNORMAL LOW (ref >90)
Glucose, Bld: 111 mg/dL — ABNORMAL HIGH (ref 70–99)
Sodium: 138 mEq/L (ref 135–145)

## 2011-08-08 LAB — MRSA PCR SCREENING: MRSA by PCR: NEGATIVE

## 2011-08-08 LAB — URINE MICROSCOPIC-ADD ON

## 2011-08-08 LAB — CARDIAC PANEL(CRET KIN+CKTOT+MB+TROPI)
CK, MB: 4 ng/mL (ref 0.3–4.0)
Troponin I: <0.3 ng/mL (ref <0.30)

## 2011-08-08 LAB — TROPONIN I: Troponin I: <0.3 ng/mL (ref <0.30)

## 2011-08-08 MED ORDER — CALCITONIN (SALMON) 200 UNIT/ACT NA SOLN
1.0000 | Freq: Every day | NASAL | Status: DC
  Administered 2011-08-08 – 2011-08-13 (×6): 1 via NASAL
  Filled 2011-08-08: qty 3.7

## 2011-08-08 MED ORDER — SODIUM CHLORIDE 0.9 % IJ SOLN: 3.0000 mL | Freq: Two times a day (BID) | INTRAMUSCULAR | Status: DC

## 2011-08-08 MED ORDER — SODIUM CHLORIDE 0.9 % IJ SOLN: 3.0000 mL | INTRAMUSCULAR | Status: DC | PRN

## 2011-08-08 MED ORDER — SODIUM CHLORIDE 0.9 % IJ SOLN
3.0000 mL | Freq: Two times a day (BID) | INTRAMUSCULAR | Status: DC
  Administered 2011-08-09: 10:00:00 via INTRAVENOUS
  Administered 2011-08-09 – 2011-08-10 (×2): 3 mL via INTRAVENOUS

## 2011-08-08 MED ORDER — ENSURE PLUS PO LIQD
237.0000 mL | Freq: Every day | ORAL | Status: DC
  Filled 2011-08-08 (×2): qty 237

## 2011-08-08 MED ORDER — MEMANTINE HCL 5 MG PO TABS
5.0000 mg | ORAL_TABLET | Freq: Two times a day (BID) | ORAL | Status: DC
  Administered 2011-08-08 – 2011-08-13 (×10): 5 mg via ORAL
  Filled 2011-08-08 (×11): qty 1

## 2011-08-08 MED ORDER — ENOXAPARIN SODIUM 40 MG/0.4ML ~~LOC~~ SOLN: 40.0000 mg | SUBCUTANEOUS | Status: DC

## 2011-08-08 MED ORDER — ONDANSETRON HCL 4 MG/2ML IJ SOLN: 4.0000 mg | Freq: Three times a day (TID) | INTRAMUSCULAR | Status: DC | PRN

## 2011-08-08 MED ORDER — ASPIRIN 325 MG PO TABS
325.0000 mg | ORAL_TABLET | Freq: Every day | ORAL | Status: DC
  Administered 2011-08-08 – 2011-08-13 (×6): 325 mg via ORAL
  Filled 2011-08-08 (×7): qty 1

## 2011-08-08 MED ORDER — SODIUM CHLORIDE 0.9 % IV SOLN: 250.0000 mL | INTRAVENOUS | Status: DC | PRN

## 2011-08-08 MED ORDER — ACETAMINOPHEN 325 MG PO TABS
650.0000 mg | ORAL_TABLET | Freq: Four times a day (QID) | ORAL | Status: DC
  Administered 2011-08-08 – 2011-08-13 (×14): 650 mg via ORAL
  Filled 2011-08-08 (×14): qty 2

## 2011-08-08 MED ORDER — DILTIAZEM HCL ER COATED BEADS 240 MG PO CP24
240.0000 mg | ORAL_CAPSULE | Freq: Every day | ORAL | Status: DC
  Administered 2011-08-09 – 2011-08-10 (×2): 240 mg via ORAL
  Filled 2011-08-08 (×3): qty 1

## 2011-08-08 MED ORDER — DEXTROSE 5 % IV SOLN
1.0000 g | Freq: Once | INTRAVENOUS | Status: AC
  Administered 2011-08-08: 1 g via INTRAVENOUS
  Filled 2011-08-08: qty 10

## 2011-08-08 MED ORDER — ENOXAPARIN SODIUM 30 MG/0.3ML ~~LOC~~ SOLN
30.0000 mg | SUBCUTANEOUS | Status: DC
  Administered 2011-08-08 – 2011-08-10 (×3): 30 mg via SUBCUTANEOUS
  Filled 2011-08-08 (×6): qty 0.3

## 2011-08-08 MED ORDER — SODIUM CHLORIDE 0.9 % IV SOLN
INTRAVENOUS | Status: AC
  Administered 2011-08-08 (×2): via INTRAVENOUS

## 2011-08-08 MED ORDER — ONDANSETRON HCL 4 MG/2ML IJ SOLN: 4.0000 mg | Freq: Four times a day (QID) | INTRAMUSCULAR | Status: DC | PRN

## 2011-08-08 MED ORDER — ONDANSETRON HCL 4 MG PO TABS: 4.0000 mg | ORAL_TABLET | Freq: Four times a day (QID) | ORAL | Status: DC | PRN

## 2011-08-08 MED ORDER — DILTIAZEM HCL 100 MG IV SOLR
5.0000 mg/h | INTRAVENOUS | Status: DC
  Administered 2011-08-08 (×2): 10 mg/h via INTRAVENOUS
  Administered 2011-08-08 – 2011-08-09 (×2): 5 mg/h via INTRAVENOUS

## 2011-08-08 MED ORDER — SODIUM CHLORIDE 0.9 % IV BOLUS (SEPSIS)
500.0000 mL | Freq: Once | INTRAVENOUS | Status: AC
  Administered 2011-08-08: 500 mL via INTRAVENOUS

## 2011-08-08 MED ORDER — DEXTROSE 5 % IV SOLN
1.0000 g | INTRAVENOUS | Status: DC
  Administered 2011-08-09 – 2011-08-11 (×3): 1 g via INTRAVENOUS
  Filled 2011-08-08 (×4): qty 10

## 2011-08-08 MED ORDER — DILTIAZEM HCL 50 MG/10ML IV SOLN: 10.0000 mg | Freq: Once | INTRAVENOUS | Status: DC

## 2011-08-08 MED ORDER — METOPROLOL TARTRATE 25 MG PO TABS
25.0000 mg | ORAL_TABLET | Freq: Two times a day (BID) | ORAL | Status: DC
  Administered 2011-08-08 – 2011-08-09 (×2): 25 mg via ORAL
  Filled 2011-08-08 (×3): qty 1

## 2011-08-08 MED ORDER — CYANOCOBALAMIN 500 MCG PO TABS
500.0000 ug | ORAL_TABLET | Freq: Every day | ORAL | Status: DC
  Administered 2011-08-08: 500 ug via ORAL
  Administered 2011-08-09: 10:00:00 via ORAL
  Administered 2011-08-10 – 2011-08-13 (×4): 500 ug via ORAL
  Filled 2011-08-08 (×6): qty 1

## 2011-08-08 NOTE — ED Provider Notes (Signed)
Medical screening examination/treatment/procedure(s) were conducted as a shared visit with non-physician practitioner(s) and myself.  I personally evaluated the patient during the encounter  afib with RVR. On cardizem gtt, will admit to hospitalist service  Lyanne Co, MD 08/08/11 1248

## 2011-08-08 NOTE — Care Management Note (Signed)
    Page 1 of 1   08/08/2011     3:48:40 PM   CARE MANAGEMENT NOTE 08/08/2011  Patient:  JURNIE, GARRITANO   Account Number:  [id number]  Date Initiated:  08/08/2011  Documentation initiated by:  Junius Creamer  Subjective/Objective Assessment:   adm w at fib w rvr     Action/Plan:   from alf, sw ref, pcp dr Sherilyn Cooter tripp   Anticipated DC Date:     Anticipated DC Plan:    In-house referral  Clinical Social Worker      DC Planning Services  CM consult      Choice offered to / List presented to:             Status of service:   Medicare Important Message given?   (If response is "NO", the following Medicare IM given date fields will be blank) Date Medicare IM given:   Date Additional Medicare IM given:    Discharge Disposition:    Per UR Regulation:  Reviewed for med. necessity/level of care/duration of stay  If discussed at Long Length of Stay Meetings, dates discussed:    Comments:  8/7 15:45p debbie Oriah Leinweber rn,bsn [phone]

## 2011-08-08 NOTE — ED Provider Notes (Signed)
History     CSN: [id number]  Arrival date & time 08/08/11  4098   First MD Initiated Contact with Patient 08/08/11 0940      Chief Complaint  Patient presents with  . Loss of Consciousness    (Consider location/radiation/quality/duration/timing/severity/associated sxs/prior treatment) HPI Hx from EMS, pt unable to offer hx 2/2 severe dementia. 78 year old female with past medical history Alzheimer disease, atrial fibrillation who presents after a possible syncopal episode. She lives at a nursing facility and the nursing staff apparently saw her collapse while eating breakfast this morning. She did not hit her head. She was briefly unresponsive. She was arousable with sternal rub. On EMS arrival, the patient was found to be in atrial fibrillation with rapid ventricular response. On arrival to the emergency department, the patient was back to her baseline. She is nonverbal. Level V caveat applies.  Patient has been hospitalized several times in the past with A. fib with RVR.  Past Medical History  Diagnosis Date  . Alzheimer disease   . Atrial fibrillation   . Gastro - esophageal reflux disease   . Ischemic colitis, enteritis, or enterocolitis     Past Surgical History  Procedure Date  . Unknown   . Esophagogastroduodenoscopy 11/10/2010    Procedure: ESOPHAGOGASTRODUODENOSCOPY (EGD);  Surgeon: Theda Belfast;  Location: [facility];  Service: Endoscopy;  Laterality: N/A;  . Flexible sigmoidoscopy 11/10/2010    Procedure: FLEXIBLE SIGMOIDOSCOPY;  Surgeon: Theda Belfast;  Location: [facility];  Service: Gastroenterology;  Laterality: N/A;    Family History  Problem Relation Age of Onset  . Diabetes Mother     History  Substance Use Topics  . Smoking status: Never Smoker   . Smokeless tobacco: Never Used  . Alcohol Use: No    OB History    Grav Para Term Preterm Abortions TAB SAB Ect Mult Living                  Review of Systems  Unable to perform ROS: Dementia     Allergies  Review of patient's allergies indicates no known allergies.  Home Medications   Current Outpatient Rx  Name Route Sig Dispense Refill  . ACETAMINOPHEN 325 MG PO TABS Oral Take 2 tablets (650 mg total) by mouth 4 (four) times daily.    . ASPIRIN 325 MG PO TABS Oral Take 325 mg by mouth daily.    Marland Kitchen CALCITONIN (SALMON) 200 UNIT/ACT NA SOLN Nasal Place 1 spray into the nose daily.    Marland Kitchen CALCIUM CARBONATE-VITAMIN D 600-400 MG-UNIT PO TABS Oral Take 1 tablet by mouth daily.      Marland Kitchen VITAMIN D 1000 UNITS PO TABS Oral Take 1,000 Units by mouth daily.      Marland Kitchen CRANBERRY 500 MG PO CAPS Oral Take 1 capsule by mouth 2 (two) times daily.    Marland Kitchen DILTIAZEM HCL ER COATED BEADS 240 MG PO CP24 Oral Take 1 capsule (240 mg total) by mouth daily.    Marland Kitchen ENSURE PLUS PO LIQD Oral Take 237 mLs by mouth daily at 12 noon.    Marland Kitchen MEMANTINE HCL 5 MG PO TABS Oral Take 5 mg by mouth 2 (two) times daily.      Marland Kitchen METOPROLOL TARTRATE 25 MG PO TABS Oral Take 25 mg by mouth 2 (two) times daily.    . TRAMADOL HCL 50 MG PO TABS Oral Take 50 mg by mouth every 4 (four) hours as needed. For pain      BP 115/75  Pulse 157  Temp 97.7 F (36.5 C) (Axillary)  Resp 13  SpO2 96%  Physical Exam  Nursing note and vitals reviewed. Constitutional: She appears well-developed and well-nourished. No distress.       No acute distress  HENT:  Head: Normocephalic and atraumatic.  Eyes: Pupils are equal, round, and reactive to light.  Neck: Normal range of motion. Neck supple.  Cardiovascular: An irregularly irregular rhythm present. Tachycardia present.   Pulmonary/Chest: Effort normal.  Abdominal: Soft. There is no tenderness. There is no rebound.  Musculoskeletal: Normal range of motion.  Neurological: She is alert. GCS eye subscore is 4. GCS verbal subscore is 1. GCS motor subscore is 5.       Pt nonverbal, noncooperative with neuro exam (able to squeeze fingers but unable to cooperate with CN testing)  Skin: Skin is warm  and dry. She is not diaphoretic.  Psychiatric: She has a normal mood and affect.    ED Course  Procedures (including critical care time)  Date: 08/08/2011  Rate: 162  Rhythm: atrial fibrillation with RVR  QRS Axis: normal  Intervals: na  ST/T Wave abnormalities: ST dep in V5-V6  Conduction Disutrbances:none  Narrative Interpretation:   Old EKG Reviewed: compared with June 22, 2011, pt was also in afib with RVR, no ST dep seen  Labs Reviewed  CBC WITH DIFFERENTIAL - Abnormal; Notable for the following:    RBC 3.76 (*)     Hemoglobin 10.6 (*)     HCT 33.0 (*)     All other components within normal limits  BASIC METABOLIC PANEL - Abnormal; Notable for the following:    Glucose, Bld 111 (*)     GFR calc non Af Amer 76 (*)     GFR calc Af Amer 88 (*)     All other components within normal limits  URINALYSIS, ROUTINE W REFLEX MICROSCOPIC - Abnormal; Notable for the following:    Nitrite POSITIVE (*)     Leukocytes, UA SMALL (*)     All other components within normal limits  URINE MICROSCOPIC-ADD ON - Abnormal; Notable for the following:    Bacteria, UA MANY (*)     All other components within normal limits  TROPONIN I   Dg Chest Port 1 View  08/08/2011  *RADIOLOGY REPORT*  Clinical Data: Short of breath.  Hypoxia.  Syncope.  PORTABLE CHEST - 1 VIEW  Comparison: 06/22/2011  Findings: Mild cardiomegaly stable.  Both lungs are clear.  Again noted is an incompletely healed, subacute left humeral neck fracture, which shows periosteal new bone formation.  IMPRESSION:  1.  Stable cardiomegaly.  No active lung disease. 2.  Subacute, healing left humeral neck fracture.  Original Report Authenticated By: Danae Orleans, M.D.     1. Atrial fibrillation with rapid ventricular response   2. Urinary tract infection       MDM  Patient was seen immediately on arrival to the ED. She was found to be in atrial fibrillation with RVR. Hx of same previously, is on cardizem and metoprolol at home.  Pt started on cardizem gtt for rate control - not much response seen to 5 mg, gtt turned up to 10 mg. She does appear to have new ST inversions on ECG today, some of which may be rate driven. Evidence of UTI on labs. Rocephin ordered. Pt will require admission for continued control of sx. Spoke with [facility] who accepts pt for admission.   Grant Fontana, PA-C 08/08/11 1222

## 2011-08-08 NOTE — H&P (Signed)
[facility] History and Physical  Stacey Huang NWG:[id number] DOB: Sep 23, 1922 DOA: 08/08/2011  Referring physician: ER PCP: Florentina Jenny, MD   Chief Complaint: LOC per nursing home  HPI:  Hx from EMS, pt unable to offer hx 2/2 severe dementia. 78 year old female with past medical history Alzheimer disease who lives in a Memory unit.  She has a history of atrial fibrillation and presents after a possible syncopal episode. She lives at a nursing facility and the nursing staff apparently saw her collapse while eating breakfast this morning. She did not hit her head. She was briefly unresponsive. She was arousable with sternal rub. On EMS arrival, the patient was found to be in atrial fibrillation with rapid ventricular response. On arrival to the emergency department, the patient was back to her baseline.    Patient has been hospitalized several times in the past with A. fib with RVR.  She is not a candidate for coumadin.   Review of Systems:  Unable to do as patient is demented  Past Medical History  Diagnosis Date  . Alzheimer disease   . Atrial fibrillation   . Gastro - esophageal reflux disease   . Ischemic colitis, enteritis, or enterocolitis    Past Surgical History  Procedure Date  . Unknown   . Esophagogastroduodenoscopy 11/10/2010    Procedure: ESOPHAGOGASTRODUODENOSCOPY (EGD);  Surgeon: Theda Belfast;  Location: [facility];  Service: Endoscopy;  Laterality: N/A;  . Flexible sigmoidoscopy 11/10/2010    Procedure: FLEXIBLE SIGMOIDOSCOPY;  Surgeon: Theda Belfast;  Location: [facility];  Service: Gastroenterology;  Laterality: N/A;   Social History:  reports that she has never smoked. She has never used smokeless tobacco. She reports that she does not drink alcohol or use illicit drugs. Came from SNF- (memory Unit)   No Known Allergies  Family History  Problem Relation Age of Onset  . Diabetes Mother     Prior to Admission medications   Medication Sig Start  Date End Date Taking? Authorizing Provider  acetaminophen (TYLENOL) 325 MG tablet Take 2 tablets (650 mg total) by mouth 4 (four) times daily. 06/25/11 06/24/12 Yes Christina P Rama, MD  aspirin 325 MG tablet Take 325 mg by mouth daily.   Yes Historical Provider, MD  calcitonin, salmon, (MIACALCIN/FORTICAL) 200 UNIT/ACT nasal spray Place 1 spray into the nose daily.   Yes Historical Provider, MD  Calcium Carbonate-Vitamin D (CALCIUM 600+D) 600-400 MG-UNIT per tablet Take 1 tablet by mouth daily.     Yes Historical Provider, MD  cholecalciferol (VITAMIN D) 1000 UNITS tablet Take 1,000 Units by mouth daily.     Yes Historical Provider, MD  Cranberry 500 MG CAPS Take 1 capsule by mouth 2 (two) times daily.   Yes Historical Provider, MD  cyanocobalamin 500 MCG tablet Take 500 mcg by mouth daily.   Yes Historical Provider, MD  diltiazem (CARDIZEM CD) 240 MG 24 hr capsule Take 1 capsule (240 mg total) by mouth daily. 06/25/11 06/24/12 Yes Christina P Rama, MD  Ensure Plus (ENSURE PLUS) LIQD Take 237 mLs by mouth daily at 12 noon.   Yes Historical Provider, MD  memantine (NAMENDA) 5 MG tablet Take 5 mg by mouth 2 (two) times daily.     Yes Historical Provider, MD  metoprolol tartrate (LOPRESSOR) 25 MG tablet Take 25 mg by mouth 2 (two) times daily.   Yes Historical Provider, MD  traMADol (ULTRAM) 50 MG tablet Take 50 mg by mouth every 4 (four) hours as needed. For pain  Historical Provider, MD   Physical Exam: Filed Vitals:   08/08/11 1315 08/08/11 1400 08/08/11 1430 08/08/11 1445  BP: 114/86 112/57 102/41 124/83  Pulse: 82 119 100 106  Temp:      TempSrc:      Resp: 18 20 15 19   SpO2: 98% 99% 99% 98%     General:  Awake, NAD, answers questions occasionally  Eyes: WNL  ENT: WNL  Neck: supple  Cardiovascular: irregular and fast  Respiratory: clear anterior  Abdomen: +BS, soft, NT/ND  Skin: few areas of bruising, no major areas of breakdown noted  Musculoskeletal: moves all 4  extremities,   Psychiatric: pleasant but not able to cooperate for exam  Neurologic: again, not able to cooperate for full exam  Labs on Admission:  Basic Metabolic Panel:  Lab 08/08/11 4540  NA 138  K 3.7  CL 101  CO2 28  GLUCOSE 111*  BUN 13  CREATININE 0.70  CALCIUM 9.5  MG --  PHOS --   Liver Function Tests: No results found for this basename: AST:5,ALT:5,ALKPHOS:5,BILITOT:5,PROT:5,ALBUMIN:5 in the last 168 hours No results found for this basename: LIPASE:5,AMYLASE:5 in the last 168 hours No results found for this basename: AMMONIA:5 in the last 168 hours CBC:  Lab 08/08/11 1003  WBC 9.0  NEUTROABS 6.7  HGB 10.6*  HCT 33.0*  MCV 87.8  PLT 308   Cardiac Enzymes:  Lab 08/08/11 1003  CKTOTAL --  CKMB --  CKMBINDEX --  TROPONINI <0.30    BNP (last 3 results) No results found for this basename: PROBNP:3 in the last 8760 hours CBG: No results found for this basename: GLUCAP:5 in the last 168 hours  Radiological Exams on Admission: Dg Chest Port 1 View  08/08/2011  *RADIOLOGY REPORT*  Clinical Data: Short of breath.  Hypoxia.  Syncope.  PORTABLE CHEST - 1 VIEW  Comparison: 06/22/2011  Findings: Mild cardiomegaly stable.  Both lungs are clear.  Again noted is an incompletely healed, subacute left humeral neck fracture, which shows periosteal new bone formation.  IMPRESSION:  1.  Stable cardiomegaly.  No active lung disease. 2.  Subacute, healing left humeral neck fracture.  Original Report Authenticated By: Danae Orleans, M.D.    EKG: Independently reviewed. A fib with RVR  Assessment/Plan Principal Problem:  *Atrial fibrillation with RVR Active Problems:  Dementia  Shoulder fracture, left  UTI (lower urinary tract infection)   1. A fib with RVR- Cardizem gtt, give PO medications and wean off gtt.  Once off gtt transfer out of step down.  Not a candidate for coumadin 2. Dementia- seems to be at baseline 3. UTI- await culture, rocephin 4. Old L shoulder  fracture- healing   Place in step down for Cardizem titration   Code Status: DNR Family Communication: none at bedside Disposition Plan: back to SNF when HR controlled  Time spent: 70 min  TRUE Garciamartinez [facility] Pager [phone]  If 7PM-7AM, please contact night-coverage [email or web address] Password [facility] 08/08/2011, 2:53 PM

## 2011-08-08 NOTE — Progress Notes (Signed)
Disposition note  Stacey Huang, is a 78 y.o. female,   MRN: [id number]  -  DOB - 07-31-1922  Outpatient Primary MD for the patient is Florentina Jenny, MD   Blood pressure 115/75, pulse 157, temperature 97.7 F (36.5 C), temperature source Axillary, resp. rate 13, SpO2 96.00%.  Active Problems:  Atrial fibrillation with RVR  UTI (lower urinary tract infection)  Dementia  Shoulder fracture, left   78 yo old female from [facility] SNF with prior history of Afib with RVR, Severe Alzheimer's dementia, MGUS, and GERD, slumped over this morning at breakfast and was brought to the ED.  She was found to be in Rapid afib with RVR and have a mild UTI.  She is currently on a cardizem drip.  I did "eyeball" the patient.  She appears anxious and unable to provide history.  When I palpated her abdomen she told me that it hurt.  Other wise she appears stable.  I will request a step down bed due to continued need for Cardizem drip.    Algis Downs, PA-C [facility] Pager: [phone]

## 2011-08-08 NOTE — ED Notes (Signed)
Patient found by facility staff unresponsive and drooling while eating breakfast, patient aroused by sternal rub, upon EMS arrival patient found to be in a-fib, no documented history of same at this time, patient returning to baseline status prior to arriving at ED,

## 2011-08-09 LAB — TSH: TSH: 0.846 u[IU]/mL (ref 0.350–4.500)

## 2011-08-09 LAB — CBC
Hemoglobin: 9.5 g/dL — ABNORMAL LOW (ref 12.0–15.0)
MCH: 27.7 pg (ref 26.0–34.0)
RBC: 3.43 MIL/uL — ABNORMAL LOW (ref 3.87–5.11)
WBC: 7.5 10*3/uL (ref 4.0–10.5)

## 2011-08-09 LAB — BASIC METABOLIC PANEL
CO2: 26 mEq/L (ref 19–32)
GFR calc non Af Amer: 81 mL/min — ABNORMAL LOW (ref >90)
Glucose, Bld: 102 mg/dL — ABNORMAL HIGH (ref 70–99)
Potassium: 3.7 mEq/L (ref 3.5–5.1)
Sodium: 142 mEq/L (ref 135–145)

## 2011-08-09 LAB — CARDIAC PANEL(CRET KIN+CKTOT+MB+TROPI)
Relative Index: INVALID (ref 0.0–2.5)
Total CK: 61 U/L (ref 7–177)
Troponin I: <0.3 ng/mL (ref <0.30)
Troponin I: <0.3 ng/mL (ref <0.30)

## 2011-08-09 MED ORDER — ENSURE COMPLETE PO LIQD
237.0000 mL | ORAL | Status: DC
  Administered 2011-08-09: 237 mL via ORAL

## 2011-08-09 MED ORDER — SODIUM CHLORIDE 0.9 % IV SOLN
INTRAVENOUS | Status: DC
  Administered 2011-08-09 – 2011-08-11 (×4): via INTRAVENOUS

## 2011-08-09 NOTE — Progress Notes (Signed)
INITIAL ADULT NUTRITION ASSESSMENT Date: 08/09/2011   Time: 11:17 AM Reason for Assessment: Nutrition Risk  INTERVENTION: 1. Notify RN of pt's need for assistance 2. Change Ensure plus to Ensure Complete once daily 3. Update food preferences in [facility]  4. RD will continue to follow    ASSESSMENT: Female 78 y.o.  Dx: Atrial fibrillation with RVR  Hx:  Past Medical History  Diagnosis Date  . Alzheimer disease   . Atrial fibrillation   . Gastro - esophageal reflux disease   . Ischemic colitis, enteritis, or enterocolitis     Related Meds:     . sodium chloride   Intravenous STAT  . acetaminophen  650 mg Oral QID  . aspirin  325 mg Oral Daily  . calcitonin (salmon)  1 spray Alternating Nares Daily  . cefTRIAXone (ROCEPHIN)  IV  1 g Intravenous Once  . cefTRIAXone (ROCEPHIN)  IV  1 g Intravenous Q24H  . cyanocobalamin  500 mcg Oral Daily  . diltiazem  240 mg Oral Daily  . enoxaparin (LOVENOX) injection  30 mg Subcutaneous Q24H  . Ensure Plus  237 mL Oral Q1200  . memantine  5 mg Oral BID  . metoprolol tartrate  25 mg Oral BID  . sodium chloride  3 mL Intravenous Q12H  . sodium chloride  3 mL Intravenous Q12H  . DISCONTD: diltiazem  10 mg Intravenous Once  . DISCONTD: enoxaparin (LOVENOX) injection  40 mg Subcutaneous Q24H    Ht: 5\' 1"  (154.9 cm)  Wt: 109 lb 9.1 oz (49.7 kg)  Ideal Wt: 47.8 kg % Ideal Wt: 104%  Usual Wt:  Wt Readings from Last 5 Encounters:  08/08/11 109 lb 9.1 oz (49.7 kg)  06/25/11 122 lb 9.2 oz (55.6 kg)  06/17/11 113 lb 5.1 oz (51.4 kg)  11/23/10 114 lb 11.2 oz (52.028 kg)  11/11/10 115 lb 8.3 oz (52.4 kg)    % Usual Wt: 95%  Body mass index is 20.70 kg/(m^2). Pt is WNL based on current BMI  Food/Nutrition Related Hx: per family, pt was eating well, requires assistance with meals. Family states pt has had a stable weight.   Labs:  CMP     Component Value Date/Time   NA 142 08/09/2011 0535   K 3.7 08/09/2011 0535   CL 109  08/09/2011 0535   CO2 26 08/09/2011 0535   GLUCOSE 102* 08/09/2011 0535   BUN 7 08/09/2011 0535   CREATININE 0.58 08/09/2011 0535   CALCIUM 8.7 08/09/2011 0535   PROT 6.4 06/23/2011 0400   ALBUMIN 2.7* 06/23/2011 0400   AST 19 06/23/2011 0400   ALT 18 06/23/2011 0400   ALKPHOS 83 06/23/2011 0400   BILITOT 0.4 06/23/2011 0400   GFRNONAA 81* 08/09/2011 0535   GFRAA >90 08/09/2011 0535      Intake/Output Summary (Last 24 hours) at 08/09/11 1120 Last data filed at 08/09/11 0800  Gross per 24 hour  Intake 1899.08 ml  Output      0 ml  Net 1899.08 ml     Diet Order: Cardiac  Supplements/Tube Feeding: Ensure Plus daily   IVF:    diltiazem (CARDIZEM) infusion Last Rate: Stopped (08/09/11 0115)    Estimated Nutritional Needs:   Kcal: 1200-1300 Protein: 50-60 gm  Fluid:  > 1.5 L  Per nursing facilty records pt with Ensure plus daily, no diet indicated. Family reports pt does better with finger foods and also foods with suce or gravy, RD will add this to diet.  Per weight hx, UBW is about 113 lbs, last month weight was up to 122 lbs, now with 13 lb weight loss (10%). Question accuracy of that weight. Likely pt with 4 lb weight loss (3.5%) over the past almost 2 months, not significant weight loss.  RD will notify RN that pt needs assistance with meals.   NUTRITION DIAGNOSIS: -Predicted suboptimal nutrient intake (NI-5.11.1).  Status: Ongoing  RELATED TO: dementia   AS EVIDENCE BY: dependent on assistance for feeding   MONITORING/EVALUATION(Goals): Goal: assistance with meals to provide >90% estimated nutrition needs Monitor: PO intake, weight, labs  EDUCATION NEEDS: -No education needs identified at this time   DOCUMENTATION CODES Per approved criteria  -Not Applicable    Clarene Duke RD, LDN Pager [phone] After Hours pager [phone]

## 2011-08-09 NOTE — Progress Notes (Signed)
[facility] PROGRESS NOTE  Stacey Huang WUX:[id number] DOB: 1922-04-29 DOA: 08/08/2011 PCP: Florentina Jenny, MD  Assessment/Plan: Principal Problem:  *Atrial fibrillation with RVR Active Problems:  Dementia  Shoulder fracture, left  UTI (lower urinary tract infection)  1. A fib- RVR: off cardizem gtt, continue PO cardizem and d/C BB as it caused her to have low BP-- consider dig if PO cardizem is unable to control HR, echo done at recent hospitalization 2. UTI: urine culture in progress- continue abx 3. Dementia- seems to be at baseline  tx out of ICU later today if BP improves  Code Status: DNR Family Communication: son via nursing Disposition Plan: back to SNF when medically ready    HPI/Subjective: Awake, no new c/o-- very soft spoken   Objective: Filed Vitals:   08/09/11 0745 08/09/11 1100 08/09/11 1130 08/09/11 1145  BP: 125/56 91/51 71/50  81/45  Pulse: 108 72 68 70  Temp: 98.7 F (37.1 C)  99.9 F (37.7 C)   TempSrc: Oral  Axillary   Resp: 19 23 17 17   Height:      Weight:      SpO2: 94% 97% 98% 98%    Intake/Output Summary (Last 24 hours) at 08/09/11 1236 Last data filed at 08/09/11 0800  Gross per 24 hour  Intake 1899.08 ml  Output      0 ml  Net 1899.08 ml    Exam:   General:  Pleasant and cooperative  Cardiovascular: irr  Respiratory: clear ant  Abdomen: +BS, soft, NT/ND  Data Reviewed: Basic Metabolic Panel:  Lab 08/09/11 2536 08/08/11 1003  NA 142 138  K 3.7 3.7  CL 109 101  CO2 26 28  GLUCOSE 102* 111*  BUN 7 13  CREATININE 0.58 0.70  CALCIUM 8.7 9.5  MG -- --  PHOS -- --   Liver Function Tests: No results found for this basename: AST:5,ALT:5,ALKPHOS:5,BILITOT:5,PROT:5,ALBUMIN:5 in the last 168 hours No results found for this basename: LIPASE:5,AMYLASE:5 in the last 168 hours No results found for this basename: AMMONIA:5 in the last 168 hours CBC:  Lab 08/09/11 0535 08/08/11 1003  WBC 7.5 9.0  NEUTROABS -- 6.7  HGB  9.5* 10.6*  HCT 29.8* 33.0*  MCV 86.9 87.8  PLT 296 308   Cardiac Enzymes:  Lab 08/09/11 0830 08/09/11 0006 08/08/11 1618 08/08/11 1003  CKTOTAL 61 79 88 --  CKMB 3.0 3.9 4.0 --  CKMBINDEX -- -- -- --  TROPONINI <0.30 <0.30 <0.30 <0.30   BNP (last 3 results) No results found for this basename: PROBNP:3 in the last 8760 hours CBG: No results found for this basename: GLUCAP:5 in the last 168 hours  Recent Results (from the past 240 hour(s))  MRSA PCR SCREENING     Status: Normal   Collection Time   08/08/11  4:11 PM      Component Value Range Status Comment   MRSA by PCR NEGATIVE  NEGATIVE Final      Studies: Dg Chest Port 1 View  08/08/2011  *RADIOLOGY REPORT*  Clinical Data: Short of breath.  Hypoxia.  Syncope.  PORTABLE CHEST - 1 VIEW  Comparison: 06/22/2011  Findings: Mild cardiomegaly stable.  Both lungs are clear.  Again noted is an incompletely healed, subacute left humeral neck fracture, which shows periosteal new bone formation.  IMPRESSION:  1.  Stable cardiomegaly.  No active lung disease. 2.  Subacute, healing left humeral neck fracture.  Original Report Authenticated By: Danae Orleans, M.D.    Scheduled Meds:   .  sodium chloride   Intravenous STAT  . acetaminophen  650 mg Oral QID  . aspirin  325 mg Oral Daily  . calcitonin (salmon)  1 spray Alternating Nares Daily  . cefTRIAXone (ROCEPHIN)  IV  1 g Intravenous Once  . cefTRIAXone (ROCEPHIN)  IV  1 g Intravenous Q24H  . cyanocobalamin  500 mcg Oral Daily  . diltiazem  240 mg Oral Daily  . enoxaparin (LOVENOX) injection  30 mg Subcutaneous Q24H  . feeding supplement  237 mL Oral Q24H  . memantine  5 mg Oral BID  . sodium chloride  3 mL Intravenous Q12H  . sodium chloride  3 mL Intravenous Q12H  . DISCONTD: enoxaparin (LOVENOX) injection  40 mg Subcutaneous Q24H  . DISCONTD: Ensure Plus  237 mL Oral Q1200  . DISCONTD: metoprolol tartrate  25 mg Oral BID   Continuous Infusions:   . sodium chloride    .  diltiazem (CARDIZEM) infusion Stopped (08/09/11 0115)    Principal Problem:  *Atrial fibrillation with RVR Active Problems:  Dementia  Shoulder fracture, left  UTI (lower urinary tract infection)    Time spent: 35    Marlin Canary  [facility] Pager [phone]  08/09/2011, 12:36 PM  LOS: 1 day

## 2011-08-09 NOTE — Clinical Social Work Psychosocial (Signed)
     Clinical Social Work Department BRIEF PSYCHOSOCIAL ASSESSMENT 08/09/2011  Patient:  Stacey Huang, Stacey Huang     Account Number:  [id number]     Admit date:  08/08/2011  Clinical Social Worker:  Hulan Fray  Date/Time:  08/09/2011 11:42 AM  Referred by:  Physician  Date Referred:  08/08/2011 Referred for  Other - See comment   Other Referral:   Interview type:  Family Other interview type:    PSYCHOSOCIAL DATA Living Status:  FACILITY Admitted from facility:  [facility] Level of care:  Assisted Living Primary support name:  Ronnie Primary support relationship to patient:  CHILD, ADULT Degree of support available:   supportive    CURRENT CONCERNS Current Concerns  Other - See comment   Other Concerns:   Admit from facility    SOCIAL WORK ASSESSMENT / PLAN Clinical Social Worker received referral for patient being admitted from facility. CSW reviewed chart and saw that patient's address is for [facility], but CSW called facility and patient moved from their facility on June 22. CSW spoke with patient's son, [name] and he confirmed that patient is no longer a resident at [place], but is residing in [facility] [phone]). CSW will complete FL2 for MD's signature and continue to follow to facilitate discharge back to facility.   Assessment/plan status:  Psychosocial Support/Ongoing Assessment of Needs Other assessment/ plan:   Information/referral to community resources:   Patient is from facility    PATIENTS/FAMILYS RESPONSE TO PLAN OF CARE: Patient's son was appreciative of CSW's assistance and is agreeable to patient returning back to facility.

## 2011-08-10 DIAGNOSIS — A498 Other bacterial infections of unspecified site: Secondary | ICD-10-CM

## 2011-08-10 MED ORDER — DILTIAZEM HCL 60 MG PO TABS
60.0000 mg | ORAL_TABLET | Freq: Four times a day (QID) | ORAL | Status: DC
  Administered 2011-08-10 – 2011-08-13 (×13): 60 mg via ORAL
  Filled 2011-08-10 (×17): qty 1

## 2011-08-10 MED ORDER — DIGOXIN 0.0625 MG HALF TABLET
0.0625 mg | ORAL_TABLET | Freq: Every day | ORAL | Status: DC
  Administered 2011-08-10 – 2011-08-13 (×4): 0.0625 mg via ORAL
  Filled 2011-08-10 (×4): qty 1

## 2011-08-10 NOTE — Progress Notes (Signed)
Clinical Social Worker spoke with Anette Riedel [phone]) at [facility] regarding potential discharge over weekend. Anette Riedel stated that he will not have anybody available over the weekend to evaluate patient. Anette Riedel stated that he will be able to complete evaluation Monday 08/13/11. If any home health needs arises, Anette Riedel stated that they can be written on the discharge summary and FL2. CSW will continue to follow.   Rozetta Nunnery MSW, [employer] [phone]

## 2011-08-10 NOTE — Progress Notes (Signed)
[facility] PROGRESS NOTE  Stacey Huang UJW:[id number] DOB: March 12, 1922 DOA: 08/08/2011 PCP: Florentina Jenny, MD  Assessment/Plan: Principal Problem:  *Atrial fibrillation with RVR Active Problems:  Dementia  Shoulder fracture, left  UTI (lower urinary tract infection)  1. A fib- RVR: off cardizem gtt, continue PO cardizem and d/C BB as it caused her to have low BP-- added low dose dig PO;  echo done at recent hospitalization-- check dig level on Monday 2. UTI: e coli- continue abx day #3 3. Dementia- seems to be at baseline  tx out of ICU to tele bed  Code Status: DNR Family Communication: none yet Disposition Plan: back to SNF when medically ready    HPI/Subjective: Repeating same words over and over   Objective: Filed Vitals:   08/10/11 0600 08/10/11 0705 08/10/11 0731 08/10/11 0800  BP: 106/39 136/54  116/53  Pulse: 90 56  102  Temp:   98.2 F (36.8 C)   TempSrc:   Oral   Resp: 16 24  15   Height:      Weight:      SpO2:  99%  97%    Intake/Output Summary (Last 24 hours) at 08/10/11 1038 Last data filed at 08/10/11 0057  Gross per 24 hour  Intake    760 ml  Output      0 ml  Net    760 ml    Exam:   General:  Pleasant and cooperative  Cardiovascular: irr  Respiratory: clear ant  Abdomen: +BS, soft, NT/ND  Data Reviewed: Basic Metabolic Panel:  Lab 08/09/11 5621 08/08/11 1003  NA 142 138  K 3.7 3.7  CL 109 101  CO2 26 28  GLUCOSE 102* 111*  BUN 7 13  CREATININE 0.58 0.70  CALCIUM 8.7 9.5  MG -- --  PHOS -- --   Liver Function Tests: No results found for this basename: AST:5,ALT:5,ALKPHOS:5,BILITOT:5,PROT:5,ALBUMIN:5 in the last 168 hours No results found for this basename: LIPASE:5,AMYLASE:5 in the last 168 hours No results found for this basename: AMMONIA:5 in the last 168 hours CBC:  Lab 08/09/11 0535 08/08/11 1003  WBC 7.5 9.0  NEUTROABS -- 6.7  HGB 9.5* 10.6*  HCT 29.8* 33.0*  MCV 86.9 87.8  PLT 296 308   Cardiac  Enzymes:  Lab 08/09/11 0830 08/09/11 0006 08/08/11 1618 08/08/11 1003  CKTOTAL 61 79 88 --  CKMB 3.0 3.9 4.0 --  CKMBINDEX -- -- -- --  TROPONINI <0.30 <0.30 <0.30 <0.30   BNP (last 3 results) No results found for this basename: PROBNP:3 in the last 8760 hours CBG: No results found for this basename: GLUCAP:5 in the last 168 hours  Recent Results (from the past 240 hour(s))  URINE CULTURE     Status: Normal (Preliminary result)   Collection Time   08/08/11 10:41 AM      Component Value Range Status Comment   Specimen Description URINE, CATHETERIZED   Final    Special Requests [facility] ADD [id number]   Final    Culture  Setup Time 08/08/2011 00:00   Final    Colony Count >=100,000 COLONIES/ML   Final    Culture ESCHERICHIA COLI   Final    Report Status PENDING   Incomplete   MRSA PCR SCREENING     Status: Normal   Collection Time   08/08/11  4:11 PM      Component Value Range Status Comment   MRSA by PCR NEGATIVE  NEGATIVE Final      Studies: Dg  Chest Port 1 View  08/08/2011  *RADIOLOGY REPORT*  Clinical Data: Short of breath.  Hypoxia.  Syncope.  PORTABLE CHEST - 1 VIEW  Comparison: 06/22/2011  Findings: Mild cardiomegaly stable.  Both lungs are clear.  Again noted is an incompletely healed, subacute left humeral neck fracture, which shows periosteal new bone formation.  IMPRESSION:  1.  Stable cardiomegaly.  No active lung disease. 2.  Subacute, healing left humeral neck fracture.  Original Report Authenticated By: Danae Orleans, M.D.    Scheduled Meds:    . acetaminophen  650 mg Oral QID  . aspirin  325 mg Oral Daily  . calcitonin (salmon)  1 spray Alternating Nares Daily  . cefTRIAXone (ROCEPHIN)  IV  1 g Intravenous Q24H  . cyanocobalamin  500 mcg Oral Daily  . digoxin  0.0625 mg Oral Daily  . diltiazem  60 mg Oral Q6H  . enoxaparin (LOVENOX) injection  30 mg Subcutaneous Q24H  . feeding supplement  237 mL Oral Q24H  . memantine  5 mg Oral BID  . sodium chloride  3 mL  Intravenous Q12H  . DISCONTD: diltiazem  240 mg Oral Daily  . DISCONTD: Ensure Plus  237 mL Oral Q1200  . DISCONTD: metoprolol tartrate  25 mg Oral BID  . DISCONTD: sodium chloride  3 mL Intravenous Q12H   Continuous Infusions:    . sodium chloride Stopped (08/09/11 1900)  . DISCONTD: diltiazem (CARDIZEM) infusion Stopped (08/09/11 0115)    Principal Problem:  *Atrial fibrillation with RVR Active Problems:  Dementia  Shoulder fracture, left  UTI (lower urinary tract infection)    Time spent: 25    Marlin Canary  [facility] Pager [phone]  08/10/2011, 10:38 AM  LOS: 2 days

## 2011-08-11 MED ORDER — SULFAMETHOXAZOLE-TRIMETHOPRIM 400-80 MG PO TABS
1.0000 | ORAL_TABLET | Freq: Two times a day (BID) | ORAL | Status: DC
  Administered 2011-08-11 – 2011-08-13 (×5): 1 via ORAL
  Filled 2011-08-11 (×6): qty 1

## 2011-08-11 MED ORDER — FOSFOMYCIN TROMETHAMINE 3 G PO PACK
3.0000 g | PACK | Freq: Once | ORAL | Status: DC
  Filled 2011-08-11: qty 3

## 2011-08-11 MED ORDER — METOPROLOL TARTRATE 1 MG/ML IV SOLN
5.0000 mg | Freq: Once | INTRAVENOUS | Status: AC
  Administered 2011-08-11: 5 mg via INTRAVENOUS
  Filled 2011-08-11: qty 5

## 2011-08-11 NOTE — Evaluation (Signed)
Physical Therapy Evaluation Patient Details Name: Yenty Bloch MRN: [id number] DOB: 08/05/1922 Today's Date: 08/11/2011 Time: 7846-9629 PT Time Calculation (min): 24 min  PT Assessment / Plan / Recommendation Clinical Impression  PAtient s/p afib with RVR and healing L shoulder Fracture with decr mobility secondary to dementia and weakness.  Will benefit from PT to address transfers and try to maintain patient's strength.  Should be able to return to A Living if they provide 24 hour care. Otherwise will need NHP./    PT Assessment  Patient needs continued PT services    Follow Up Recommendations  Home health PT;Supervision/Assistance - 24 hour (at A living)    Barriers to Discharge        Equipment Recommendations  None recommended by PT;Defer to next venue    Recommendations for Other Services     Frequency Min 3X/week    Precautions / Restrictions Precautions Precautions: Fall Restrictions Other Position/Activity Restrictions: Question whether patient is NWB as fracture is subacute and did not note a weight bearing in chart.   Pertinent Vitals/Pain VSS, No pain      Mobility  Bed Mobility Bed Mobility: Rolling Right;Right Sidelying to Sit Rolling Right: 2: Max assist Right Sidelying to Sit: 2: Max assist;HOB flat Details for Bed Mobility Assistance: Patient needed facilitation and cues to initiate movement to come to EOB.  Patient leans posterior and needs a lot of facilitation to lean forward and obtain upright sitting posture.   Transfers Transfers: Sit to Stand;Stand to Sit;Stand Pivot Transfers Sit to Stand: 1: +2 Total assist;From bed;With upper extremity assist (limited use of LUE) Sit to Stand: Patient Percentage: 60% Stand to Sit: 1: +2 Total assist;To chair/3-in-1;With armrests Stand to Sit: Patient Percentage: 40% Stand Pivot Transfers: 1: +2 Total assist Stand Pivot Transfers: Patient Percentage: 50% Details for Transfer Assistance: Patient leaning too far  posteriorly to stand initially even with facilitation of weight shift and cuing. Obtained RW and placed in front of patient and allowed her to pull up on RW and patient was able to stand to be cleaned of urine.  PAtient was still leaning posteriorly but stood enough to be cleaned.  Sat patient down and explained that we were going to transfer to chair.  Patient stood again and with facilittation of weight shift, began to pivot.  Halfway around pivot, patient began leaning greater posteriorly and PT and tech had to guide her hips to chair.  Patient with uncontrolled descent into chair.   Ambulation/Gait Ambulation/Gait Assistance: Not tested (comment) Stairs: No Wheelchair Mobility Wheelchair Mobility: No         PT Diagnosis: Generalized weakness  PT Problem List: Decreased strength;Decreased activity tolerance;Decreased mobility;Decreased balance;Decreased cognition;Decreased safety awareness;Decreased knowledge of use of DME;Decreased knowledge of precautions PT Treatment Interventions: DME instruction;Therapeutic exercise;Gait training;Functional mobility training;Balance training;Therapeutic activities;Patient/family education   PT Goals Acute Rehab PT Goals PT Goal Formulation: With patient Time For Goal Achievement: 08/25/11 Potential to Achieve Goals: Good Pt will go Supine/Side to Sit: with min assist PT Goal: Supine/Side to Sit - Progress: Goal set today Pt will Sit at Edge of Bed: with min assist;3-5 min;with unilateral upper extremity support PT Goal: Sit at Edge Of Bed - Progress: Goal set today Pt will go Sit to Stand: with mod assist PT Goal: Sit to Stand - Progress: Goal set today Pt will go Stand to Sit: with mod assist PT Goal: Stand to Sit - Progress: Goal set today Pt will Transfer Bed to Chair/Chair to Bed: with  mod assist PT Transfer Goal: Bed to Chair/Chair to Bed - Progress: Goal set today  Visit Information  Last PT Received On: 08/11/11 Assistance Needed: +2     Subjective Data  Subjective: Patient with dementia.  Nothing pertinent to conversation was said. Patient Stated Goal: Unable to assess   Prior Functioning  Home Living Lives With: Other (Comment) Available Help at Discharge: Available 24 hours/day (Memory care unit) Type of Home: Assisted living Prior Function Level of Independence: Needs assistance Needs Assistance: Bathing;Dressing;Feeding;Grooming;Toileting;Gait;Transfers Bath: Total Dressing: Total Feeding: Total Grooming: Total Toileting: Maximal Gait Assistance: Tot A of 2  Transfer Assistance: Tot A of 2 Able to Take Stairs?: No Driving: No Vocation: Retired Musician: No difficulties    Cognition  Overall Cognitive Status: History of cognitive impairments - at baseline Area of Impairment: Memory;Following commands;Safety/judgement;Awareness of errors;Awareness of deficits;Problem solving;Attention Arousal/Alertness: Awake/alert Orientation Level: Disoriented X4 Behavior During Session: Restless Current Attention Level: Focused Memory Deficits: Memory issues PTA Following Commands: Follows one step commands inconsistently;Follows one step commands with increased time Safety/Judgement: Decreased awareness of safety precautions;Decreased safety judgement for tasks assessed;Decreased awareness of need for assistance Safety/Judgement - Other Comments: Placed chair alarm in chair Awareness of Errors: Assistance required to identify errors made;Assistance required to correct errors made Problem Solving: Poor    Extremity/Trunk Assessment Right Upper Extremity Assessment RUE ROM/Strength/Tone: WFL for tasks assessed RUE Sensation: WFL - Light Touch RUE Coordination: WFL - gross/fine motor Left Upper Extremity Assessment LUE ROM/Strength/Tone: Deficits;Unable to fully assess LUE ROM/Strength/Tone Deficits: UE fx Right Lower Extremity Assessment RLE ROM/Strength/Tone: Deficits RLE ROM/Strength/Tone  Deficits: Cannot follow commands to move for testing Left Lower Extremity Assessment LLE ROM/Strength/Tone: Deficits LLE ROM/Strength/Tone Deficits: Cannot follow commands to move for testing Trunk Assessment Trunk Assessment: Kyphotic   Balance Static Sitting Balance Static Sitting - Balance Support: Feet supported;Right upper extremity supported Static Sitting - Level of Assistance: 2: Max assist Static Sitting - Comment/# of Minutes: Leans posteriorly, sat EOB 4 minutes, needing facilitation of anterior weight shift to sit EOB Static Standing Balance Static Standing - Balance Support: Bilateral upper extremity supported;During functional activity (limited use of L UE) Static Standing - Level of Assistance: 1: +2 Total assist (pt = 50%) Static Standing - Comment/# of Minutes: 1 minute  End of Session PT - End of Session Equipment Utilized During Treatment: Gait belt Activity Tolerance: Patient limited by fatigue Patient left: in chair;with call bell/phone within reach;with chair alarm set Nurse Communication: Mobility status;Need for lift equipment (Recommend use of Stedy for transfers)       INGOLD,Joelene Barriere 08/11/2011, 9:55 AM  [facility] [phone] (pager)

## 2011-08-11 NOTE — Progress Notes (Signed)
Pt HR increased to 140.  HR sustaining in 110-130's.  BP is 160/60.  MD notified.  New orders received and carried out. Pt shows no s/sx's of distress.  Will continue to monitor.

## 2011-08-11 NOTE — Progress Notes (Signed)
[facility] PROGRESS NOTE  Stacey Huang AVW:[id number] DOB: [birth year] DOA: 08/08/2011 PCP: Florentina Jenny, MD  Assessment/Plan: Principal Problem:  *Atrial fibrillation with RVR Active Problems:  Dementia  Shoulder fracture, left  UTI (lower urinary tract infection)  1. A fib- RVR: off cardizem gtt, continue PO cardizem and d/C BB as it caused her to have low BP-- added low dose dig PO;  echo done at recent hospitalization-- check dig level on Monday 2. UTI: e coli ESBL- d/c rocephin and give septra day #1 3. Dementia- seems to be at baseline    Code Status: DNR Family Communication: none yet Disposition Plan: back to [facility] when screened on Monday    HPI/Subjective: Up with PT- seems very anxious    Objective: Filed Vitals:   08/10/11 2100 08/11/11 0500 08/11/11 0538 08/11/11 0625  BP: 125/57 148/65 160/60 110/70  Pulse: 90 90 117 79  Temp: 98.1 F (36.7 C) 97.7 F (36.5 C)    TempSrc: Axillary Axillary    Resp: 19 19    Height:      Weight:      SpO2: 95% 96% 97%    No intake or output data in the 24 hours ending 08/11/11 0926  Exam:   General:  Pleasant and cooperative  Cardiovascular: irr  Respiratory: clear ant  Abdomen: +BS, soft, NT/ND  Data Reviewed: Basic Metabolic Panel:  Lab 08/09/11 8295 08/08/11 1003  NA 142 138  K 3.7 3.7  CL 109 101  CO2 26 28  GLUCOSE 102* 111*  BUN 7 13  CREATININE 0.58 0.70  CALCIUM 8.7 9.5  MG -- --  PHOS -- --   Liver Function Tests: No results found for this basename: AST:5,ALT:5,ALKPHOS:5,BILITOT:5,PROT:5,ALBUMIN:5 in the last 168 hours No results found for this basename: LIPASE:5,AMYLASE:5 in the last 168 hours No results found for this basename: AMMONIA:5 in the last 168 hours CBC:  Lab 08/09/11 0535 08/08/11 1003  WBC 7.5 9.0  NEUTROABS -- 6.7  HGB 9.5* 10.6*  HCT 29.8* 33.0*  MCV 86.9 87.8  PLT 296 308   Cardiac Enzymes:  Lab 08/09/11 0830 08/09/11 0006 08/08/11 1618 08/08/11 1003    CKTOTAL 61 79 88 --  CKMB 3.0 3.9 4.0 --  CKMBINDEX -- -- -- --  TROPONINI <0.30 <0.30 <0.30 <0.30   BNP (last 3 results) No results found for this basename: PROBNP:3 in the last 8760 hours CBG: No results found for this basename: GLUCAP:5 in the last 168 hours  Recent Results (from the past 240 hour(s))  URINE CULTURE     Status: Normal   Collection Time   08/08/11 10:41 AM      Component Value Range Status Comment   Specimen Description URINE, CATHETERIZED   Final    Special Requests [facility] ADD [id number]   Final    Culture  Setup Time 08/08/2011 00:00   Final    Colony Count >=100,000 COLONIES/ML   Final    Culture     Final    Value: ESCHERICHIA COLI     Note: Confirmed Extended Spectrum Beta-Lactamase Producer (ESBL)   Report Status 08/10/2011 FINAL   Final    Organism ID, Bacteria ESCHERICHIA COLI   Final   MRSA PCR SCREENING     Status: Normal   Collection Time   08/08/11  4:11 PM      Component Value Range Status Comment   MRSA by PCR NEGATIVE  NEGATIVE Final      Studies: Dg Chest Port 1  View  08/08/2011  *RADIOLOGY REPORT*  Clinical Data: Short of breath.  Hypoxia.  Syncope.  PORTABLE CHEST - 1 VIEW  Comparison: 06/22/2011  Findings: Mild cardiomegaly stable.  Both lungs are clear.  Again noted is an incompletely healed, subacute left humeral neck fracture, which shows periosteal new bone formation.  IMPRESSION:  1.  Stable cardiomegaly.  No active lung disease. 2.  Subacute, healing left humeral neck fracture.  Original Report Authenticated By: Danae Orleans, M.D.    Scheduled Meds:    . acetaminophen  650 mg Oral QID  . aspirin  325 mg Oral Daily  . calcitonin (salmon)  1 spray Alternating Nares Daily  . cyanocobalamin  500 mcg Oral Daily  . digoxin  0.0625 mg Oral Daily  . diltiazem  60 mg Oral Q6H  . enoxaparin (LOVENOX) injection  30 mg Subcutaneous Q24H  . feeding supplement  237 mL Oral Q24H  . memantine  5 mg Oral BID  . metoprolol  5 mg Intravenous Once   . sodium chloride  3 mL Intravenous Q12H  . sulfamethoxazole-trimethoprim  1 tablet Oral Q12H  . DISCONTD: cefTRIAXone (ROCEPHIN)  IV  1 g Intravenous Q24H  . DISCONTD: fosfomycin  3 g Oral Once   Continuous Infusions:    . sodium chloride 100 mL/hr at 08/11/11 9147    Principal Problem:  *Atrial fibrillation with RVR Active Problems:  Dementia  Shoulder fracture, left  UTI (lower urinary tract infection)    Time spent: 25    Marlin Canary  [facility] Pager [phone]  08/11/2011, 9:26 AM  LOS: 3 days

## 2011-08-12 NOTE — Progress Notes (Signed)
[facility] PROGRESS NOTE  Stacey Huang ZOX:[id number] DOB: 06/13/1922 DOA: 08/08/2011 PCP: Florentina Jenny, MD  Assessment/Plan: Principal Problem:  *Atrial fibrillation with RVR Active Problems:  Dementia  Shoulder fracture, left  UTI (lower urinary tract infection)  1. A fib- RVR: off cardizem gtt, continue PO cardizem and d/C BB as it caused her to have low BP-- added low dose dig PO;  echo done at recent hospitalization-- check dig level on Monday 2. UTI: e coli ESBL- d/c rocephin and give septra day #2 3. Dementia- seems to be at baseline    Code Status: DNR Family Communication: none yet Disposition Plan: back to [facility] when screened on Monday    HPI/Subjective: Sleeping in bed, repeating same word over and over    Objective: Filed Vitals:   08/11/11 0625 08/11/11 0926 08/11/11 2100 08/12/11 0500  BP: 110/70  135/61 130/74  Pulse: 79 104 99 104  Temp:   98 F (36.7 C) 98.3 F (36.8 C)  TempSrc:   Axillary   Resp:   20 16  Height:      Weight:      SpO2:   96% 96%    Intake/Output Summary (Last 24 hours) at 08/12/11 1240 Last data filed at 08/12/11 0300  Gross per 24 hour  Intake   3195 ml  Output      1 ml  Net   3194 ml    Exam:   General:  Pleasant and cooperative  Cardiovascular: irr  Respiratory: clear ant  Abdomen: +BS, soft, NT/ND  Data Reviewed: Basic Metabolic Panel:  Lab 08/09/11 8119 08/08/11 1003  NA 142 138  K 3.7 3.7  CL 109 101  CO2 26 28  GLUCOSE 102* 111*  BUN 7 13  CREATININE 0.58 0.70  CALCIUM 8.7 9.5  MG -- --  PHOS -- --   Liver Function Tests: No results found for this basename: AST:5,ALT:5,ALKPHOS:5,BILITOT:5,PROT:5,ALBUMIN:5 in the last 168 hours No results found for this basename: LIPASE:5,AMYLASE:5 in the last 168 hours No results found for this basename: AMMONIA:5 in the last 168 hours CBC:  Lab 08/09/11 0535 08/08/11 1003  WBC 7.5 9.0  NEUTROABS -- 6.7  HGB 9.5* 10.6*  HCT 29.8* 33.0*  MCV  86.9 87.8  PLT 296 308   Cardiac Enzymes:  Lab 08/09/11 0830 08/09/11 0006 08/08/11 1618 08/08/11 1003  CKTOTAL 61 79 88 --  CKMB 3.0 3.9 4.0 --  CKMBINDEX -- -- -- --  TROPONINI <0.30 <0.30 <0.30 <0.30   BNP (last 3 results) No results found for this basename: PROBNP:3 in the last 8760 hours CBG: No results found for this basename: GLUCAP:5 in the last 168 hours  Recent Results (from the past 240 hour(s))  URINE CULTURE     Status: Normal   Collection Time   08/08/11 10:41 AM      Component Value Range Status Comment   Specimen Description URINE, CATHETERIZED   Final    Special Requests [facility] ADD [id number]   Final    Culture  Setup Time 08/08/2011 00:00   Final    Colony Count >=100,000 COLONIES/ML   Final    Culture     Final    Value: ESCHERICHIA COLI     Note: Confirmed Extended Spectrum Beta-Lactamase Producer (ESBL)   Report Status 08/10/2011 FINAL   Final    Organism ID, Bacteria ESCHERICHIA COLI   Final   MRSA PCR SCREENING     Status: Normal   Collection Time  08/08/11  4:11 PM      Component Value Range Status Comment   MRSA by PCR NEGATIVE  NEGATIVE Final      Studies: Dg Chest Port 1 View  08/08/2011  *RADIOLOGY REPORT*  Clinical Data: Short of breath.  Hypoxia.  Syncope.  PORTABLE CHEST - 1 VIEW  Comparison: 06/22/2011  Findings: Mild cardiomegaly stable.  Both lungs are clear.  Again noted is an incompletely healed, subacute left humeral neck fracture, which shows periosteal new bone formation.  IMPRESSION:  1.  Stable cardiomegaly.  No active lung disease. 2.  Subacute, healing left humeral neck fracture.  Original Report Authenticated By: Danae Orleans, M.D.    Scheduled Meds:    . acetaminophen  650 mg Oral QID  . aspirin  325 mg Oral Daily  . calcitonin (salmon)  1 spray Alternating Nares Daily  . cyanocobalamin  500 mcg Oral Daily  . digoxin  0.0625 mg Oral Daily  . diltiazem  60 mg Oral Q6H  . enoxaparin (LOVENOX) injection  30 mg Subcutaneous Q24H    . feeding supplement  237 mL Oral Q24H  . memantine  5 mg Oral BID  . sulfamethoxazole-trimethoprim  1 tablet Oral Q12H  . DISCONTD: sodium chloride  3 mL Intravenous Q12H   Continuous Infusions:    . DISCONTD: sodium chloride 100 mL/hr at 08/11/11 2014    Principal Problem:  *Atrial fibrillation with RVR Active Problems:  Dementia  Shoulder fracture, left  UTI (lower urinary tract infection)    Time spent: 25    Marlin Canary  [facility] Pager [phone]  08/12/2011, 12:40 PM  LOS: 4 days

## 2011-08-13 LAB — URINE CULTURE: Colony Count: >=100000

## 2011-08-13 LAB — DIGOXIN LEVEL: Digoxin Level: <0.3 ng/mL — ABNORMAL LOW (ref 0.8–2.0)

## 2011-08-13 MED ORDER — DIGOXIN 0.0625 MG HALF TABLET: 0.0625 mg | ORAL_TABLET | Freq: Every day | ORAL | Status: DC

## 2011-08-13 MED ORDER — SULFAMETHOXAZOLE-TRIMETHOPRIM 400-80 MG PO TABS: 1.0000 | ORAL_TABLET | Freq: Two times a day (BID) | ORAL | Status: AC

## 2011-08-13 MED ORDER — TRAMADOL HCL 50 MG PO TABS: 50.0000 mg | ORAL_TABLET | ORAL | Status: DC | PRN

## 2011-08-13 MED ORDER — DILTIAZEM HCL 60 MG PO TABS: 60.0000 mg | ORAL_TABLET | Freq: Four times a day (QID) | ORAL | Status: DC

## 2011-08-13 MED ORDER — METOPROLOL TARTRATE 12.5 MG HALF TABLET: 12.5000 mg | ORAL_TABLET | Freq: Two times a day (BID) | ORAL | Status: DC

## 2011-08-13 MED ORDER — METOPROLOL TARTRATE 12.5 MG HALF TABLET
12.5000 mg | ORAL_TABLET | Freq: Two times a day (BID) | ORAL | Status: DC
  Administered 2011-08-13: 12.5 mg via ORAL
  Filled 2011-08-13 (×2): qty 1

## 2011-08-13 MED ORDER — DILTIAZEM HCL 60 MG PO TABS: 60.0000 mg | ORAL_TABLET | Freq: Four times a day (QID) | ORAL | Status: AC

## 2011-08-13 MED ORDER — SULFAMETHOXAZOLE-TRIMETHOPRIM 400-80 MG PO TABS: 1.0000 | ORAL_TABLET | Freq: Two times a day (BID) | ORAL | Status: DC

## 2011-08-13 NOTE — Progress Notes (Signed)
CSW submitted clinicals for dc to pt alf. Pt is medically stable for dc awaiting confirmation from alf for patient to return. Pt son, Karcyn Menn confirmed that pt plans to return to [facility] when stable and will provide transportation. CSW and pt son anticipate pt to be transported to alf at approximately 2pm.   .Clinical social worker continuing to follow pt to assist with pt dc plans and further csw needs.   Catha Gosselin, Theresia Majors  [phone] .08/13/2011 10:54am

## 2011-08-13 NOTE — Progress Notes (Signed)
Clinical social worker assisted with pt return to ALF, [facility]. Pt son will be providing pt transportation back to ALF. Pt son will be provided with pt alf discharge packet for facility. .No further Clinical Social Work needs, signing off.   Catha Gosselin, Theresia Majors  [phone] .08/13/2011 1453pm

## 2011-08-13 NOTE — Discharge Summary (Addendum)
Physician Discharge Summary  Kamala Kolton HQI:[id number] DOB: 13-Jun-1922 DOA: 08/08/2011  PCP: Florentina Jenny, MD  Admit date: 08/08/2011 Discharge date: 08/13/2011  Recommendations for Outpatient Follow-up:  1. 5 day of antibiotics for ESBL UTI (PM dose Monday and 2 more days) 2. Monitor dig level- may be able to d/c dig if patient's BP continues to improve    Discharge Diagnoses:  Principal Problem:  *Atrial fibrillation with RVR Active Problems:  Dementia  Shoulder fracture, left  UTI (lower urinary tract infection)   Discharge Condition: DNR  Diet recommendation: heart healthy  Filed Weights   08/08/11 1545  Weight: 49.7 kg (109 lb 9.1 oz)    History of present illness:  Hx from EMS, pt unable to offer hx 2/2 severe dementia. 78 year old female with past medical history Alzheimer disease who lives in a Memory unit. She has a history of atrial fibrillation and presents after a possible syncopal episode. She lives at a nursing facility and the nursing staff apparently saw her collapse while eating breakfast this morning. She did not hit her head. She was briefly unresponsive. She was arousable with sternal rub. On EMS arrival, the patient was found to be in atrial fibrillation with rapid ventricular response. On arrival to the emergency department, the patient was back to her baseline.  Patient has been hospitalized several times in the past with A. fib with RVR. She is not a candidate for coumadin.   Hospital Course:  1. A fib- RVR: off cardizem gtt, PO cardizem and low dose BB (larger dose caused her to have low BP)-- added low dose dig PO; echo done at recent hospitalization 2. UTI: e coli ESBL- d/c rocephin and give septra day #3 3. Dementia- seems to be at baseline   Discharge Exam: Filed Vitals:   08/13/11 0500  BP: 150/60  Pulse: 97  Temp: 97.9 F (36.6 C)  Resp: 18   Filed Vitals:   08/12/11 0500 08/12/11 1300 08/12/11 2100 08/13/11 0500  BP: 130/74 113/58  122/51 150/60  Pulse: 104 77 84 97  Temp: 98.3 F (36.8 C) 98.1 F (36.7 C) 99.8 F (37.7 C) 97.9 F (36.6 C)  TempSrc:    Axillary  Resp: 16 18 18 18   Height:      Weight:      SpO2: 96% 99% 94% 95%    General: pleasant and cooperative Cardiovascular: irrr Respiratory: clear anteriorly  Discharge Instructions  Discharge Orders    Future Orders Please Complete By Expires   Diet - low sodium heart healthy      Increase activity slowly      Discharge instructions      Comments:   Monitor dig level- may not need dig if BP continues to improve     Medication List  As of 08/13/2011  9:27 AM   STOP taking these medications         diltiazem 240 MG 24 hr capsule         TAKE these medications         acetaminophen 325 MG tablet   Commonly known as: TYLENOL   Take 2 tablets (650 mg total) by mouth 4 (four) times daily.      aspirin 325 MG tablet   Take 325 mg by mouth daily.      calcitonin (salmon) 200 UNIT/ACT nasal spray   Commonly known as: MIACALCIN/FORTICAL   Place 1 spray into the nose daily.      Calcium 600+D 600-400 MG-UNIT per tablet  Generic drug: Calcium Carbonate-Vitamin D   Take 1 tablet by mouth daily.      cholecalciferol 1000 UNITS tablet   Commonly known as: VITAMIN D   Take 1,000 Units by mouth daily.      Cranberry 500 MG Caps   Take 1 capsule by mouth 2 (two) times daily.      cyanocobalamin 500 MCG tablet   Take 500 mcg by mouth daily.      digoxin 0.0625 mg Tabs   Commonly known as: LANOXIN   Take 0.5 tablets (0.0625 mg total) by mouth daily.      diltiazem 60 MG tablet   Commonly known as: CARDIZEM   Take 1 tablet (60 mg total) by mouth every 6 (six) hours.      Ensure Plus Liqd   Take 237 mLs by mouth daily at 12 noon.      memantine 5 MG tablet   Commonly known as: NAMENDA   Take 5 mg by mouth 2 (two) times daily.      metoprolol tartrate 12.5 mg Tabs   Commonly known as: LOPRESSOR   Take 0.5 tablets (12.5 mg total) by  mouth 2 (two) times daily.      sulfamethoxazole-trimethoprim 400-80 MG per tablet   Commonly known as: BACTRIM,SEPTRA   Take 1 tablet by mouth every 12 (twelve) hours.      traMADol 50 MG tablet   Commonly known as: ULTRAM   Take 50 mg by mouth every 4 (four) hours as needed. For pain              The results of significant diagnostics from this hospitalization (including imaging, microbiology, ancillary and laboratory) are listed below for reference.    Significant Diagnostic Studies: Dg Chest Port 1 View  08/08/2011  *RADIOLOGY REPORT*  Clinical Data: Short of breath.  Hypoxia.  Syncope.  PORTABLE CHEST - 1 VIEW  Comparison: 06/22/2011  Findings: Mild cardiomegaly stable.  Both lungs are clear.  Again noted is an incompletely healed, subacute left humeral neck fracture, which shows periosteal new bone formation.  IMPRESSION:  1.  Stable cardiomegaly.  No active lung disease. 2.  Subacute, healing left humeral neck fracture.  Original Report Authenticated By: Danae Orleans, M.D.    Microbiology: Recent Results (from the past 240 hour(s))  URINE CULTURE     Status: Normal   Collection Time   08/08/11 10:41 AM      Component Value Range Status Comment   Specimen Description URINE, CATHETERIZED   Final    Special Requests [facility] ADD [id number]   Final    Culture  Setup Time 08/08/2011 00:00   Final    Colony Count >=100,000 COLONIES/ML   Final    Culture     Final    Value: ESCHERICHIA COLI     Note: Confirmed Extended Spectrum Beta-Lactamase Producer (ESBL)   Report Status 08/13/2011 FINAL   Final    Organism ID, Bacteria ESCHERICHIA COLI   Final   MRSA PCR SCREENING     Status: Normal   Collection Time   08/08/11  4:11 PM      Component Value Range Status Comment   MRSA by PCR NEGATIVE  NEGATIVE Final      Labs: Basic Metabolic Panel:  Lab 08/09/11 0454 08/08/11 1003  NA 142 138  K 3.7 3.7  CL 109 101  CO2 26 28  GLUCOSE 102* 111*  BUN 7 13  CREATININE 0.58 0.70  CALCIUM 8.7 9.5  MG -- --  PHOS -- --   Liver Function Tests: No results found for this basename: AST:5,ALT:5,ALKPHOS:5,BILITOT:5,PROT:5,ALBUMIN:5 in the last 168 hours No results found for this basename: LIPASE:5,AMYLASE:5 in the last 168 hours No results found for this basename: AMMONIA:5 in the last 168 hours CBC:  Lab 08/09/11 0535 08/08/11 1003  WBC 7.5 9.0  NEUTROABS -- 6.7  HGB 9.5* 10.6*  HCT 29.8* 33.0*  MCV 86.9 87.8  PLT 296 308   Cardiac Enzymes:  Lab 08/09/11 0830 08/09/11 0006 08/08/11 1618 08/08/11 1003  CKTOTAL 61 79 88 --  CKMB 3.0 3.9 4.0 --  CKMBINDEX -- -- -- --  TROPONINI <0.30 <0.30 <0.30 <0.30   BNP: BNP (last 3 results) No results found for this basename: PROBNP:3 in the last 8760 hours CBG: No results found for this basename: GLUCAP:5 in the last 168 hours  Time coordinating discharge: 45 minutes  Signed:  Marlin Canary  [facility] 08/13/2011, 9:27 AM

## 2011-08-13 NOTE — Progress Notes (Signed)
PT Cancellation Note    Treatment cancelled today due to pt waiting on ambulance to transport back to [facility]isted [facility]. 08/13/2011  [place], PT [phone] (pager)

## 2011-09-10 ENCOUNTER — Encounter: Payer: Self-pay | Admitting: Cardiovascular Disease

## 2011-09-14 ENCOUNTER — Ambulatory Visit: Payer: Medicare Other | Admitting: Cardiology

## 2011-09-14 ENCOUNTER — Emergency Department (HOSPITAL_COMMUNITY): Payer: Medicare Other

## 2011-09-14 ENCOUNTER — Encounter (HOSPITAL_COMMUNITY): Payer: Self-pay | Admitting: Emergency Medicine

## 2011-09-14 ENCOUNTER — Emergency Department (HOSPITAL_COMMUNITY)
Admission: EM | Admit: 2011-09-14 | Discharge: 2011-09-14 | Disposition: A | Discharge status: Home / Self Care | Payer: Medicare Other | Attending: Emergency Medicine | Admitting: Emergency Medicine

## 2011-09-14 DIAGNOSIS — M542 Cervicalgia: Secondary | ICD-10-CM | POA: Insufficient documentation

## 2011-09-14 DIAGNOSIS — I4891 Unspecified atrial fibrillation: Secondary | ICD-10-CM | POA: Insufficient documentation

## 2011-09-14 DIAGNOSIS — R079 Chest pain, unspecified: Secondary | ICD-10-CM | POA: Insufficient documentation

## 2011-09-14 DIAGNOSIS — F0280 Dementia in other diseases classified elsewhere without behavioral disturbance: Secondary | ICD-10-CM | POA: Insufficient documentation

## 2011-09-14 DIAGNOSIS — S0280XA Fracture of other specified skull and facial bones, unspecified side, initial encounter for closed fracture: Secondary | ICD-10-CM | POA: Insufficient documentation

## 2011-09-14 DIAGNOSIS — G309 Alzheimer's disease, unspecified: Secondary | ICD-10-CM | POA: Insufficient documentation

## 2011-09-14 DIAGNOSIS — S0292XA Unspecified fracture of facial bones, initial encounter for closed fracture: Secondary | ICD-10-CM

## 2011-09-14 DIAGNOSIS — Z79899 Other long term (current) drug therapy: Secondary | ICD-10-CM | POA: Insufficient documentation

## 2011-09-14 DIAGNOSIS — W07XXXA Fall from chair, initial encounter: Secondary | ICD-10-CM | POA: Insufficient documentation

## 2011-09-14 DIAGNOSIS — Y9210 Unspecified residential institution as the place of occurrence of the external cause: Secondary | ICD-10-CM | POA: Insufficient documentation

## 2011-09-14 DIAGNOSIS — K219 Gastro-esophageal reflux disease without esophagitis: Secondary | ICD-10-CM | POA: Insufficient documentation

## 2011-09-14 DIAGNOSIS — Z7982 Long term (current) use of aspirin: Secondary | ICD-10-CM | POA: Insufficient documentation

## 2011-09-14 MED ORDER — HYDROCODONE-ACETAMINOPHEN 5-325 MG PO TABS
1.0000 | ORAL_TABLET | Freq: Once | ORAL | Status: AC
  Administered 2011-09-14: 1 via ORAL
  Filled 2011-09-14: qty 1

## 2011-09-14 NOTE — ED Notes (Signed)
Per EMS pt from [place] and fell today forward while getting up out of a chair. Witnessed by staff. No LOC. Pt hit side of right face and right shoulder pain and right hip pain. No shortening or deformity noted by EMS. Pt has dementia.

## 2011-09-14 NOTE — Discharge Instructions (Signed)
 Facial Fracture A facial fracture is a break in one of the bones of your face. HOME CARE INSTRUCTIONS   Protect the injured part of your face until it is healed.   Do not participate in activities which give chance for re-injury until your doctor approves.   Gently wash and dry your face.   Wear head and facial protection while riding a bicycle, motorcycle, or snowmobile.  SEEK MEDICAL CARE IF:   An oral temperature above 102 F (38.9 C) develops.   You have severe headaches or notice changes in your vision.   You have new numbness or tingling in your face.   You develop nausea (feeling sick to your stomach), vomiting or a stiff neck.  SEEK IMMEDIATE MEDICAL CARE IF:   You develop difficulty seeing or experience double vision.   You become dizzy, lightheaded, or faint.   You develop trouble speaking, breathing, or swallowing.   You have a watery discharge from your nose or ear.  MAKE SURE YOU:   Understand these instructions.   Will watch your condition.   Will get help right away if you are not doing well or get worse.  Document Released: 12/18/2004 Document Revised: 12/07/2010 Document Reviewed: 08/07/2007 [facility] Patient Information 2012 [place], [place].     CALL AND MAKE AN APPOINTMENT FOR NEXT WEEK WITH DR. ETHYL WITH ENT. CONTINUE TO TAKE ULTRAM /TRAMADOL  FOR PAIN AS PREVIOUSLY GIVEN.

## 2011-09-14 NOTE — ED Provider Notes (Signed)
History     CSN: [id number]  Arrival date & time 09/14/11  1212   First MD Initiated Contact with Patient 09/14/11 1237      Chief Complaint  Patient presents with  . Fall  . hit face in fall     (Consider location/radiation/quality/duration/timing/severity/associated sxs/prior treatment) HPI Comments: Level 5 caveat due to dementia.  Reportedly, pt was in chair, stood up and fell forward injuring right shoulder, right chest wall, and right face at cheek, associated with some swelling.  There was some question of possibly right hip pain.  I ask pt if she hurts in certain areas, and she is agitated, reports I don't know.  She was found in chair by EMS at the facility when they arrived.  Pt needs assistance to walk at baseline.  She had no LOC. Witnessed by staff there.    Patient is a 78 y.o. female presenting with fall. The history is provided by the patient and the EMS personnel.  Fall    Past Medical History  Diagnosis Date  . Alzheimer disease   . Atrial fibrillation   . Gastro - esophageal reflux disease   . Ischemic colitis, enteritis, or enterocolitis     Past Surgical History  Procedure Date  . Unknown   . Esophagogastroduodenoscopy 11/10/2010    Procedure: ESOPHAGOGASTRODUODENOSCOPY (EGD);  Surgeon: Theda Belfast;  Location: [facility];  Service: Endoscopy;  Laterality: N/A;  . Flexible sigmoidoscopy 11/10/2010    Procedure: FLEXIBLE SIGMOIDOSCOPY;  Surgeon: Theda Belfast;  Location: [facility];  Service: Gastroenterology;  Laterality: N/A;    Family History  Problem Relation Age of Onset  . Diabetes Mother     History  Substance Use Topics  . Smoking status: Never Smoker   . Smokeless tobacco: Never Used  . Alcohol Use: No    OB History    Grav Para Term Preterm Abortions TAB SAB Ect Mult Living                  Review of Systems  Unable to perform ROS: Dementia    Allergies  Review of patient's allergies indicates no known  allergies.  Home Medications   Current Outpatient Rx  Name Route Sig Dispense Refill  . ACETAMINOPHEN 325 MG PO TABS Oral Take 2 tablets (650 mg total) by mouth 4 (four) times daily.    . ASPIRIN 325 MG PO TABS Oral Take 325 mg by mouth daily.    Marland Kitchen CALCITONIN (SALMON) 200 UNIT/ACT NA SOLN Nasal Place 1 spray into the nose daily.    Marland Kitchen CALCIUM CARBONATE-VITAMIN D 600-400 MG-UNIT PO TABS Oral Take 1 tablet by mouth daily.      Marland Kitchen VITAMIN D 1000 UNITS PO TABS Oral Take 1,000 Units by mouth daily.      Marland Kitchen CRANBERRY 500 MG PO CAPS Oral Take 1 capsule by mouth 2 (two) times daily.    . CYANOCOBALAMIN 500 MCG PO TABS Oral Take 500 mcg by mouth daily.    Marland Kitchen DIGOXIN 0.0625 MG HALF TABLET Oral Take 0.5 tablets (0.0625 mg total) by mouth daily. 30 tablet 0  . DILTIAZEM HCL 60 MG PO TABS Oral Take 1 tablet (60 mg total) by mouth every 6 (six) hours. 120 tablet 0  . ENSURE PLUS PO LIQD Oral Take 237 mLs by mouth daily at 12 noon.    . MELOXICAM 7.5 MG PO TABS Oral Take 7.5 mg by mouth daily.    Marland Kitchen MEMANTINE HCL 5 MG PO TABS  Oral Take 5 mg by mouth 2 (two) times daily.      Marland Kitchen METOPROLOL TARTRATE 12.5 MG HALF TABLET Oral Take 0.5 tablets (12.5 mg total) by mouth 2 (two) times daily. 60 tablet 0  . SULFAMETHOXAZOLE-TMP DS 800-160 MG PO TABS Oral Take 1 tablet by mouth 2 (two) times daily.    . TRAMADOL HCL 50 MG PO TABS Oral Take 1 tablet (50 mg total) by mouth every 4 (four) hours as needed for pain. For pain 15 tablet 0    BP 127/77  Pulse 84  Temp 97.4 F (36.3 C) (Axillary)  Resp 16  SpO2 95%  Physical Exam  Nursing note and vitals reviewed. Constitutional: She appears well-developed and well-nourished. She appears distressed.  HENT:  Head: Normocephalic. Head is with contusion. Head is without raccoon's eyes, without Battle's sign, without laceration and without right periorbital erythema.    Eyes: Pupils are equal, round, and reactive to light.  Neck: No spinous process tenderness present. No  rigidity. Decreased range of motion present. No edema present.       ccollar in place  Cardiovascular: Normal rate.   No murmur heard. Pulmonary/Chest: No respiratory distress. She has no wheezes.  Abdominal: Soft. She exhibits no distension. There is no tenderness. There is no rebound and no guarding.  Neurological: She is alert.  Skin: Skin is warm and dry. No rash noted.    ED Course  Procedures (including critical care time)  Labs Reviewed - No data to display Dg Ribs Unilateral W/chest Right  09/14/2011  *RADIOLOGY REPORT*  Clinical Data: Fall, right-sided rib pain  RIGHT RIBS AND CHEST - 3+ VIEW  Comparison: 08/08/2011 chest radiograph  Findings: Cardiac leads obscure detail.  Moderate to severe enlargement of the cardiomediastinal silhouette again noted. Curvilinear retrocardiac scarring or atelectasis again noted. Mildly impacted left proximal humeral fracture again noted.  No new focal pulmonary opacity.  No displaced right-sided rib fracture. No pleural effusion.  No pneumothorax.  Minimal biapical pleural thickening again noted.  IMPRESSION: No acute cardiopulmonary process or displaced right-sided rib fracture identified.   Original Report Authenticated By: Harrel Lemon, M.D.    Dg Shoulder Right  09/14/2011  *RADIOLOGY REPORT*  Clinical Data: Recent fall, right shoulder pain  RIGHT SHOULDER - 2+ VIEW  Comparison: None.  Findings: There is mild degenerative joint disease of the right shoulder.  The bones are osteopenic.  No acute fracture is seen. The right [facility] joint is normally aligned.  IMPRESSION: Osteopenic.  No acute fracture.   Original Report Authenticated By: Juline Patch, M.D.    Dg Hip Complete Right  09/14/2011  *RADIOLOGY REPORT*  Clinical Data: Recent fall with right hip pain  RIGHT HIP - COMPLETE 2+ VIEW  Comparison: None.  Findings: The bones are diffusely osteopenic.  No acute fracture is seen.  There are degenerative changes in the hips.  The pelvic rami are  intact.  The SI joints appear corticated.  IMPRESSION: Mild degenerative change in the hips for age.  No acute fracture. Osteopenia.   Original Report Authenticated By: Juline Patch, M.D.    Ct Head Wo Contrast  09/14/2011  *RADIOLOGY REPORT*  Clinical Data:  Fall, right-sided facial trauma, confusion, immobilized in a cervical collar  CT HEAD WITHOUT CONTRAST CT MAXILLOFACIAL WITHOUT CONTRAST CT CERVICAL SPINE WITHOUT CONTRAST  Technique:  Multidetector CT imaging of the head, cervical spine, and maxillofacial structures were performed using the standard protocol without intravenous contrast. Multiplanar CT image reconstructions  of the cervical spine and maxillofacial structures were also generated.  Comparison:  Head CT 06/12/2011, c-spine CT 06/12/2011  CT HEAD  Findings: Cortical volume loss noted with proportional ventricular prominence.  Periventricular white matter hypodensity likely indicates small vessel ischemic change.  No acute hemorrhage, acute infarction, or mass lesion is identified.  Bilateral anterior limb internal capsule and external capsule lacunar infarcts again noted. Examination is degraded by patient motion.  No midline shift. Right maxillary sinus air fluid level noted. Further details described below.  No skull fracture identified.  IMPRESSION: No acute intracranial finding.  Right maxillary sinus air fluid level.  Please see dedicated maxillofacial CT report below for further dilation.  Stable chronic cortical volume loss, evidence of probable small vessel ischemic change, and bilateral internal/external capsule lacunar infarcts.  CT MAXILLOFACIAL  Findings:  Examination is degraded by patient motion.  There is a mildly depressed fracture of the anterior right maxillary sinus wall admitting fat.  Associated right maxillary sinus air fluid level noted.  The vomer is midline.  Other paranasal sinuses are intact. Ostiomeatal units are patent.  Orbits are unremarkable.  Mandibular condyles  are properly located.  IMPRESSION: Mildly depressed inferior wall right maxillary sinus fracture admitting fat, with right maxillary sinus air fluid level.  CT CERVICAL SPINE  Findings:   Minimal biapical pleural thickening noted.  Mild reversal of the normal cervical lordosis noted with concomitant disc degenerative change at the C5-6 level producing mild neural foraminal narrowing at this and the C6-7 level.  Stable minimal loss of height of the C5 vertebral body and superior endplate of C6 vertebral body.  No acute fracture or dislocation. No precervical soft tissue widening is present.  Ligamentous ossification posterior to the spinous processes of C6 and C7 again incidentally noted.  IMPRESSION: Mid/inferior cervical spine disc degenerative change again noted. No acute finding or interval change.   Original Report Authenticated By: Harrel Lemon, M.D.    Ct Cervical Spine Wo Contrast  09/14/2011  *RADIOLOGY REPORT*  Clinical Data:  Fall, right-sided facial trauma, confusion, immobilized in a cervical collar  CT HEAD WITHOUT CONTRAST CT MAXILLOFACIAL WITHOUT CONTRAST CT CERVICAL SPINE WITHOUT CONTRAST  Technique:  Multidetector CT imaging of the head, cervical spine, and maxillofacial structures were performed using the standard protocol without intravenous contrast. Multiplanar CT image reconstructions of the cervical spine and maxillofacial structures were also generated.  Comparison:  Head CT 06/12/2011, c-spine CT 06/12/2011  CT HEAD  Findings: Cortical volume loss noted with proportional ventricular prominence.  Periventricular white matter hypodensity likely indicates small vessel ischemic change.  No acute hemorrhage, acute infarction, or mass lesion is identified.  Bilateral anterior limb internal capsule and external capsule lacunar infarcts again noted. Examination is degraded by patient motion.  No midline shift. Right maxillary sinus air fluid level noted. Further details described below.  No  skull fracture identified.  IMPRESSION: No acute intracranial finding.  Right maxillary sinus air fluid level.  Please see dedicated maxillofacial CT report below for further dilation.  Stable chronic cortical volume loss, evidence of probable small vessel ischemic change, and bilateral internal/external capsule lacunar infarcts.  CT MAXILLOFACIAL  Findings:  Examination is degraded by patient motion.  There is a mildly depressed fracture of the anterior right maxillary sinus wall admitting fat.  Associated right maxillary sinus air fluid level noted.  The vomer is midline.  Other paranasal sinuses are intact. Ostiomeatal units are patent.  Orbits are unremarkable.  Mandibular condyles are properly located.  IMPRESSION: Mildly depressed inferior wall right maxillary sinus fracture admitting fat, with right maxillary sinus air fluid level.  CT CERVICAL SPINE  Findings:   Minimal biapical pleural thickening noted.  Mild reversal of the normal cervical lordosis noted with concomitant disc degenerative change at the C5-6 level producing mild neural foraminal narrowing at this and the C6-7 level.  Stable minimal loss of height of the C5 vertebral body and superior endplate of C6 vertebral body.  No acute fracture or dislocation. No precervical soft tissue widening is present.  Ligamentous ossification posterior to the spinous processes of C6 and C7 again incidentally noted.  IMPRESSION: Mid/inferior cervical spine disc degenerative change again noted. No acute finding or interval change.   Original Report Authenticated By: Harrel Lemon, M.D.    Ct Maxillofacial Wo Cm  09/14/2011  *RADIOLOGY REPORT*  Clinical Data:  Fall, right-sided facial trauma, confusion, immobilized in a cervical collar  CT HEAD WITHOUT CONTRAST CT MAXILLOFACIAL WITHOUT CONTRAST CT CERVICAL SPINE WITHOUT CONTRAST  Technique:  Multidetector CT imaging of the head, cervical spine, and maxillofacial structures were performed using the standard  protocol without intravenous contrast. Multiplanar CT image reconstructions of the cervical spine and maxillofacial structures were also generated.  Comparison:  Head CT 06/12/2011, c-spine CT 06/12/2011  CT HEAD  Findings: Cortical volume loss noted with proportional ventricular prominence.  Periventricular white matter hypodensity likely indicates small vessel ischemic change.  No acute hemorrhage, acute infarction, or mass lesion is identified.  Bilateral anterior limb internal capsule and external capsule lacunar infarcts again noted. Examination is degraded by patient motion.  No midline shift. Right maxillary sinus air fluid level noted. Further details described below.  No skull fracture identified.  IMPRESSION: No acute intracranial finding.  Right maxillary sinus air fluid level.  Please see dedicated maxillofacial CT report below for further dilation.  Stable chronic cortical volume loss, evidence of probable small vessel ischemic change, and bilateral internal/external capsule lacunar infarcts.  CT MAXILLOFACIAL  Findings:  Examination is degraded by patient motion.  There is a mildly depressed fracture of the anterior right maxillary sinus wall admitting fat.  Associated right maxillary sinus air fluid level noted.  The vomer is midline.  Other paranasal sinuses are intact. Ostiomeatal units are patent.  Orbits are unremarkable.  Mandibular condyles are properly located.  IMPRESSION: Mildly depressed inferior wall right maxillary sinus fracture admitting fat, with right maxillary sinus air fluid level.  CT CERVICAL SPINE  Findings:   Minimal biapical pleural thickening noted.  Mild reversal of the normal cervical lordosis noted with concomitant disc degenerative change at the C5-6 level producing mild neural foraminal narrowing at this and the C6-7 level.  Stable minimal loss of height of the C5 vertebral body and superior endplate of C6 vertebral body.  No acute fracture or dislocation. No precervical  soft tissue widening is present.  Ligamentous ossification posterior to the spinous processes of C6 and C7 again incidentally noted.  IMPRESSION: Mid/inferior cervical spine disc degenerative change again noted. No acute finding or interval change.   Original Report Authenticated By: Harrel Lemon, M.D.      1. Facial fracture due to fall     2:31 PM Pt is resting seemingly more comfortable.  No shortening of limb initially, low suspicion for acute fractures.  Only finding after review of films myself is inf orbital wall fracture.  Pt not able to easily follow commands on testing EOM, however grossly ok.  Will need outpt referral to ENT,  Dr. Ezzard Standing is on call.  Otherwise no fracture of shoulder, ribs, hip.        MDM  Will get plain films of right side face, shoulder, ribs and hip.  Also give Norco and get CT of c spine as well.          Gavin Pound. Lamees Gable, MD 09/14/11 1436

## 2011-09-14 NOTE — ED Notes (Signed)
rn attempted to contact emeritus. Left message requesting staff call rn back to give discharge instructions to staff.

## 2011-09-14 NOTE — ED Notes (Signed)
Pt called for transport  

## 2011-09-14 NOTE — ED Notes (Addendum)
ems cut pt pants off. No crepitus or deformity noted in hip. Pt did not c/o of any right shoulder pain while staff was taking pts shirt off. Pt has hx of dementia and unable to rate pain or state name. Pt unable to swallow PO pills or follow commands to get temperature orally.    rn called [employer] Technology Officer and spoke with [employer]. Ala Dach reports pt is nonverbal at baseline and must take her medications crushed up, and only eats foods that are mashed up

## 2011-09-15 ENCOUNTER — Emergency Department (HOSPITAL_COMMUNITY): Payer: Medicare Other

## 2011-09-15 ENCOUNTER — Encounter (HOSPITAL_COMMUNITY): Payer: Self-pay | Admitting: Emergency Medicine

## 2011-09-15 ENCOUNTER — Emergency Department (HOSPITAL_COMMUNITY)
Admission: EM | Admit: 2011-09-15 | Discharge: 2011-09-15 | Disposition: A | Discharge status: Home / Self Care | Payer: Medicare Other | Attending: Emergency Medicine | Admitting: Emergency Medicine

## 2011-09-15 DIAGNOSIS — I4891 Unspecified atrial fibrillation: Secondary | ICD-10-CM | POA: Insufficient documentation

## 2011-09-15 DIAGNOSIS — Z9181 History of falling: Secondary | ICD-10-CM | POA: Insufficient documentation

## 2011-09-15 DIAGNOSIS — S79929A Unspecified injury of unspecified thigh, initial encounter: Secondary | ICD-10-CM | POA: Insufficient documentation

## 2011-09-15 DIAGNOSIS — F0280 Dementia in other diseases classified elsewhere without behavioral disturbance: Secondary | ICD-10-CM | POA: Insufficient documentation

## 2011-09-15 DIAGNOSIS — S79919A Unspecified injury of unspecified hip, initial encounter: Secondary | ICD-10-CM | POA: Insufficient documentation

## 2011-09-15 DIAGNOSIS — R4182 Altered mental status, unspecified: Secondary | ICD-10-CM | POA: Insufficient documentation

## 2011-09-15 DIAGNOSIS — K219 Gastro-esophageal reflux disease without esophagitis: Secondary | ICD-10-CM | POA: Insufficient documentation

## 2011-09-15 DIAGNOSIS — M25559 Pain in unspecified hip: Secondary | ICD-10-CM | POA: Insufficient documentation

## 2011-09-15 DIAGNOSIS — Z008 Encounter for other general examination: Secondary | ICD-10-CM | POA: Insufficient documentation

## 2011-09-15 DIAGNOSIS — Z79899 Other long term (current) drug therapy: Secondary | ICD-10-CM | POA: Insufficient documentation

## 2011-09-15 DIAGNOSIS — G309 Alzheimer's disease, unspecified: Secondary | ICD-10-CM | POA: Insufficient documentation

## 2011-09-15 DIAGNOSIS — Y92129 Unspecified place in nursing home as the place of occurrence of the external cause: Secondary | ICD-10-CM

## 2011-09-15 NOTE — ED Provider Notes (Signed)
History     CSN: [id number]  Arrival date & time 09/15/11  1538   First MD Initiated Contact with Patient 09/15/11 1621      Chief Complaint  Patient presents with  . Fall    (Consider location/radiation/quality/duration/timing/severity/associated sxs/prior treatment) HPI Pt sent from NH for sliding out of chair with no apparent injury. Pt has baseline dementia and is unable to contribute to history though she denies any pain.  Past Medical History  Diagnosis Date  . Alzheimer disease   . Atrial fibrillation   . Gastro - esophageal reflux disease   . Ischemic colitis, enteritis, or enterocolitis     Past Surgical History  Procedure Date  . Unknown   . Esophagogastroduodenoscopy 11/10/2010    Procedure: ESOPHAGOGASTRODUODENOSCOPY (EGD);  Surgeon: Theda Belfast;  Location: [facility];  Service: Endoscopy;  Laterality: N/A;  . Flexible sigmoidoscopy 11/10/2010    Procedure: FLEXIBLE SIGMOIDOSCOPY;  Surgeon: Theda Belfast;  Location: [facility];  Service: Gastroenterology;  Laterality: N/A;    Family History  Problem Relation Age of Onset  . Diabetes Mother     History  Substance Use Topics  . Smoking status: Never Smoker   . Smokeless tobacco: Never Used  . Alcohol Use: No    OB History    Grav Para Term Preterm Abortions TAB SAB Ect Mult Living                  Review of Systems  HENT: Negative for neck pain.   Cardiovascular: Negative for chest pain.  Gastrointestinal: Negative for abdominal pain.  Musculoskeletal: Negative for back pain.    Allergies  Review of patient's allergies indicates no known allergies.  Home Medications   Current Outpatient Rx  Name Route Sig Dispense Refill  . ACETAMINOPHEN 325 MG PO TABS Oral Take 650 mg by mouth every 6 (six) hours as needed.    . ASPIRIN 325 MG PO TABS Oral Take 325 mg by mouth daily.    Marland Kitchen CALCITONIN (SALMON) 200 UNIT/ACT NA SOLN Nasal Place 1 spray into the nose daily.    Marland Kitchen CALCIUM  CARBONATE-VITAMIN D 600-400 MG-UNIT PO TABS Oral Take 1 tablet by mouth daily.      Marland Kitchen VITAMIN D 1000 UNITS PO TABS Oral Take 1,000 Units by mouth daily.      Marland Kitchen CRANBERRY 500 MG PO CAPS Oral Take 1 capsule by mouth 2 (two) times daily.    . CYANOCOBALAMIN 500 MCG PO TABS Oral Take 500 mcg by mouth daily.    Marland Kitchen DIGOXIN 0.125 MG PO TABS Oral Take 0.0625 mg by mouth daily. Pt takes 1/2 tab =0.0625 mg    . DILTIAZEM HCL 60 MG PO TABS Oral Take 1 tablet (60 mg total) by mouth every 6 (six) hours. 120 tablet 0  . ENSURE PLUS PO LIQD Oral Take 237 mLs by mouth daily at 12 noon. Chocolate ensure    . MELOXICAM 7.5 MG PO TABS Oral Take 7.5 mg by mouth daily.    Marland Kitchen MEMANTINE HCL 5 MG PO TABS Oral Take 5 mg by mouth 2 (two) times daily.      Marland Kitchen METOPROLOL TARTRATE 25 MG PO TABS Oral Take 12.5 mg by mouth 2 (two) times daily. Pt receives 1/2 tab = 12.5mg     . SULFAMETHOXAZOLE-TMP DS 800-160 MG PO TABS Oral Take 1 tablet by mouth 2 (two) times daily.    . TRAMADOL HCL 50 MG PO TABS Oral Take 1 tablet (50 mg total)  by mouth every 4 (four) hours as needed for pain. For pain 15 tablet 0  . TRIAMCINOLONE ACETONIDE 0.1 % EX CREA Topical Apply 1 application topically 2 (two) times daily.      BP 138/84  Pulse 97  Temp 98.2 F (36.8 C) (Oral)  Resp 18  SpO2 98%  Physical Exam  Nursing note and vitals reviewed. Constitutional: She is oriented to person, place, and time. She appears well-developed and well-nourished. No distress.  HENT:  Head: Normocephalic and atraumatic.  Mouth/Throat: Oropharynx is clear and moist.       Mild swelling over R zygomatic arch consistent with previous facial trauma  Eyes: EOM are normal. Pupils are equal, round, and reactive to light.  Neck: Normal range of motion. Neck supple.       No posterior cervical TTP  Cardiovascular: Normal rate and regular rhythm.   Pulmonary/Chest: Effort normal and breath sounds normal. No respiratory distress. She has no wheezes. She has no rales.   Abdominal: Soft. Bowel sounds are normal. There is no tenderness. There is no rebound and no guarding.  Musculoskeletal: Normal range of motion. She exhibits no edema and no tenderness.       FROM at all joints. No lower extremity shortening  Neurological: She is alert and oriented to person, place, and time.       5/5 motor in all ext. Sensation intact. Pt follows simple commands  Skin: Skin is warm and dry. No rash noted. No erythema.    ED Course  Procedures (including critical care time)  Labs Reviewed - No data to display Dg Ribs Unilateral W/chest Right  09/14/2011  *RADIOLOGY REPORT*  Clinical Data: Fall, right-sided rib pain  RIGHT RIBS AND CHEST - 3+ VIEW  Comparison: 08/08/2011 chest radiograph  Findings: Cardiac leads obscure detail.  Moderate to severe enlargement of the cardiomediastinal silhouette again noted. Curvilinear retrocardiac scarring or atelectasis again noted. Mildly impacted left proximal humeral fracture again noted.  No new focal pulmonary opacity.  No displaced right-sided rib fracture. No pleural effusion.  No pneumothorax.  Minimal biapical pleural thickening again noted.  IMPRESSION: No acute cardiopulmonary process or displaced right-sided rib fracture identified.   Original Report Authenticated By: Harrel Lemon, M.D.    Dg Shoulder Right  09/14/2011  *RADIOLOGY REPORT*  Clinical Data: Recent fall, right shoulder pain  RIGHT SHOULDER - 2+ VIEW  Comparison: None.  Findings: There is mild degenerative joint disease of the right shoulder.  The bones are osteopenic.  No acute fracture is seen. The right [facility] joint is normally aligned.  IMPRESSION: Osteopenic.  No acute fracture.   Original Report Authenticated By: Juline Patch, M.D.    Dg Hip Complete Right  09/14/2011  *RADIOLOGY REPORT*  Clinical Data: Recent fall with right hip pain  RIGHT HIP - COMPLETE 2+ VIEW  Comparison: None.  Findings: The bones are diffusely osteopenic.  No acute fracture is seen.   There are degenerative changes in the hips.  The pelvic rami are intact.  The SI joints appear corticated.  IMPRESSION: Mild degenerative change in the hips for age.  No acute fracture. Osteopenia.   Original Report Authenticated By: Juline Patch, M.D.    Ct Head Wo Contrast  09/14/2011  *RADIOLOGY REPORT*  Clinical Data:  Fall, right-sided facial trauma, confusion, immobilized in a cervical collar  CT HEAD WITHOUT CONTRAST CT MAXILLOFACIAL WITHOUT CONTRAST CT CERVICAL SPINE WITHOUT CONTRAST  Technique:  Multidetector CT imaging of the head, cervical spine,  and maxillofacial structures were performed using the standard protocol without intravenous contrast. Multiplanar CT image reconstructions of the cervical spine and maxillofacial structures were also generated.  Comparison:  Head CT 06/12/2011, c-spine CT 06/12/2011  CT HEAD  Findings: Cortical volume loss noted with proportional ventricular prominence.  Periventricular white matter hypodensity likely indicates small vessel ischemic change.  No acute hemorrhage, acute infarction, or mass lesion is identified.  Bilateral anterior limb internal capsule and external capsule lacunar infarcts again noted. Examination is degraded by patient motion.  No midline shift. Right maxillary sinus air fluid level noted. Further details described below.  No skull fracture identified.  IMPRESSION: No acute intracranial finding.  Right maxillary sinus air fluid level.  Please see dedicated maxillofacial CT report below for further dilation.  Stable chronic cortical volume loss, evidence of probable small vessel ischemic change, and bilateral internal/external capsule lacunar infarcts.  CT MAXILLOFACIAL  Findings:  Examination is degraded by patient motion.  There is a mildly depressed fracture of the anterior right maxillary sinus wall admitting fat.  Associated right maxillary sinus air fluid level noted.  The vomer is midline.  Other paranasal sinuses are intact. Ostiomeatal  units are patent.  Orbits are unremarkable.  Mandibular condyles are properly located.  IMPRESSION: Mildly depressed inferior wall right maxillary sinus fracture admitting fat, with right maxillary sinus air fluid level.  CT CERVICAL SPINE  Findings:   Minimal biapical pleural thickening noted.  Mild reversal of the normal cervical lordosis noted with concomitant disc degenerative change at the C5-6 level producing mild neural foraminal narrowing at this and the C6-7 level.  Stable minimal loss of height of the C5 vertebral body and superior endplate of C6 vertebral body.  No acute fracture or dislocation. No precervical soft tissue widening is present.  Ligamentous ossification posterior to the spinous processes of C6 and C7 again incidentally noted.  IMPRESSION: Mid/inferior cervical spine disc degenerative change again noted. No acute finding or interval change.   Original Report Authenticated By: Harrel Lemon, M.D.    Ct Cervical Spine Wo Contrast  09/14/2011  *RADIOLOGY REPORT*  Clinical Data:  Fall, right-sided facial trauma, confusion, immobilized in a cervical collar  CT HEAD WITHOUT CONTRAST CT MAXILLOFACIAL WITHOUT CONTRAST CT CERVICAL SPINE WITHOUT CONTRAST  Technique:  Multidetector CT imaging of the head, cervical spine, and maxillofacial structures were performed using the standard protocol without intravenous contrast. Multiplanar CT image reconstructions of the cervical spine and maxillofacial structures were also generated.  Comparison:  Head CT 06/12/2011, c-spine CT 06/12/2011  CT HEAD  Findings: Cortical volume loss noted with proportional ventricular prominence.  Periventricular white matter hypodensity likely indicates small vessel ischemic change.  No acute hemorrhage, acute infarction, or mass lesion is identified.  Bilateral anterior limb internal capsule and external capsule lacunar infarcts again noted. Examination is degraded by patient motion.  No midline shift. Right maxillary  sinus air fluid level noted. Further details described below.  No skull fracture identified.  IMPRESSION: No acute intracranial finding.  Right maxillary sinus air fluid level.  Please see dedicated maxillofacial CT report below for further dilation.  Stable chronic cortical volume loss, evidence of probable small vessel ischemic change, and bilateral internal/external capsule lacunar infarcts.  CT MAXILLOFACIAL  Findings:  Examination is degraded by patient motion.  There is a mildly depressed fracture of the anterior right maxillary sinus wall admitting fat.  Associated right maxillary sinus air fluid level noted.  The vomer is midline.  Other paranasal sinuses are  intact. Ostiomeatal units are patent.  Orbits are unremarkable.  Mandibular condyles are properly located.  IMPRESSION: Mildly depressed inferior wall right maxillary sinus fracture admitting fat, with right maxillary sinus air fluid level.  CT CERVICAL SPINE  Findings:   Minimal biapical pleural thickening noted.  Mild reversal of the normal cervical lordosis noted with concomitant disc degenerative change at the C5-6 level producing mild neural foraminal narrowing at this and the C6-7 level.  Stable minimal loss of height of the C5 vertebral body and superior endplate of C6 vertebral body.  No acute fracture or dislocation. No precervical soft tissue widening is present.  Ligamentous ossification posterior to the spinous processes of C6 and C7 again incidentally noted.  IMPRESSION: Mid/inferior cervical spine disc degenerative change again noted. No acute finding or interval change.   Original Report Authenticated By: Harrel Lemon, M.D.    Ct Pelvis Wo Contrast  09/15/2011  *RADIOLOGY REPORT*  Clinical Data:  Bilateral hip pain.  Recent fall.  CT PELVIS WITHOUT CONTRAST  Technique:  Multidetector CT imaging of the pelvis was performed following the standard protocol without intravenous contrast.  Comparison:  04/17/2008  Findings:  No pelvic  or hip fracture.  No soft tissue hematoma. The patient has a large amount of fecal matter in the rectosigmoid region.  No other soft tissue finding of note.  There is degenerative disease in the lower lumbar spine.  IMPRESSION: No acute finding.  No pelvic or hip fracture.  No cause of pain identified.   Original Report Authenticated By: Thomasenia Sales, M.D.    Ct Maxillofacial Wo Cm  09/14/2011  *RADIOLOGY REPORT*  Clinical Data:  Fall, right-sided facial trauma, confusion, immobilized in a cervical collar  CT HEAD WITHOUT CONTRAST CT MAXILLOFACIAL WITHOUT CONTRAST CT CERVICAL SPINE WITHOUT CONTRAST  Technique:  Multidetector CT imaging of the head, cervical spine, and maxillofacial structures were performed using the standard protocol without intravenous contrast. Multiplanar CT image reconstructions of the cervical spine and maxillofacial structures were also generated.  Comparison:  Head CT 06/12/2011, c-spine CT 06/12/2011  CT HEAD  Findings: Cortical volume loss noted with proportional ventricular prominence.  Periventricular white matter hypodensity likely indicates small vessel ischemic change.  No acute hemorrhage, acute infarction, or mass lesion is identified.  Bilateral anterior limb internal capsule and external capsule lacunar infarcts again noted. Examination is degraded by patient motion.  No midline shift. Right maxillary sinus air fluid level noted. Further details described below.  No skull fracture identified.  IMPRESSION: No acute intracranial finding.  Right maxillary sinus air fluid level.  Please see dedicated maxillofacial CT report below for further dilation.  Stable chronic cortical volume loss, evidence of probable small vessel ischemic change, and bilateral internal/external capsule lacunar infarcts.  CT MAXILLOFACIAL  Findings:  Examination is degraded by patient motion.  There is a mildly depressed fracture of the anterior right maxillary sinus wall admitting fat.  Associated right  maxillary sinus air fluid level noted.  The vomer is midline.  Other paranasal sinuses are intact. Ostiomeatal units are patent.  Orbits are unremarkable.  Mandibular condyles are properly located.  IMPRESSION: Mildly depressed inferior wall right maxillary sinus fracture admitting fat, with right maxillary sinus air fluid level.  CT CERVICAL SPINE  Findings:   Minimal biapical pleural thickening noted.  Mild reversal of the normal cervical lordosis noted with concomitant disc degenerative change at the C5-6 level producing mild neural foraminal narrowing at this and the C6-7 level.  Stable minimal loss  of height of the C5 vertebral body and superior endplate of C6 vertebral body.  No acute fracture or dislocation. No precervical soft tissue widening is present.  Ligamentous ossification posterior to the spinous processes of C6 and C7 again incidentally noted.  IMPRESSION: Mid/inferior cervical spine disc degenerative change again noted. No acute finding or interval change.   Original Report Authenticated By: Harrel Lemon, M.D.      1. Fall at nursing home       MDM  No focal findings on exam. ? Continues to have bl hip pain. CT negative. Will send back to NH. Continue meds for pain control likely due to contusion       Loren Racer, MD 09/15/11 1947

## 2011-09-15 NOTE — ED Notes (Signed)
Per EMS-slid out of chair and was found leaning against wall-no LOC-was here yesterday for left hip pain, work up negative

## 2011-09-15 NOTE — ED Notes (Signed)
Patient is alert and oriented x3.  She was given DC instructions and follow up visit instructions.  Family given verbal understanding. She was DC via wheelchair with family to nursing home.  V/S stable.  He was not showing any signs of distress on DC

## 2011-09-15 NOTE — ED Notes (Signed)
JWJ:[id number]<NW> Expected date:09/15/11<BR> Expected time: 3:29 PM<BR> Means of arrival:Ambulance<BR> Comments:<BR> Fall

## 2011-09-27 ENCOUNTER — Ambulatory Visit: Payer: Medicare Other | Admitting: Cardiovascular Disease

## 2011-10-01 ENCOUNTER — Ambulatory Visit: Payer: Medicare Other | Admitting: Cardiovascular Disease

## 2011-10-03 ENCOUNTER — Encounter: Payer: Self-pay | Admitting: *Deleted

## 2011-10-03 ENCOUNTER — Encounter: Payer: Self-pay | Admitting: Cardiovascular Disease

## 2011-10-04 ENCOUNTER — Encounter: Payer: Self-pay | Admitting: Cardiovascular Disease

## 2011-10-04 ENCOUNTER — Ambulatory Visit (INDEPENDENT_AMBULATORY_CARE_PROVIDER_SITE_OTHER): Payer: Medicare Other | Admitting: Cardiovascular Disease

## 2011-10-04 VITALS — BP 108/58 | HR 59 | Ht 61.0 in | Wt 113.0 lb

## 2011-10-04 DIAGNOSIS — F0280 Dementia in other diseases classified elsewhere without behavioral disturbance: Secondary | ICD-10-CM

## 2011-10-04 DIAGNOSIS — G309 Alzheimer's disease, unspecified: Secondary | ICD-10-CM

## 2011-10-04 DIAGNOSIS — N390 Urinary tract infection, site not specified: Secondary | ICD-10-CM

## 2011-10-04 DIAGNOSIS — I4891 Unspecified atrial fibrillation: Secondary | ICD-10-CM

## 2011-10-04 DIAGNOSIS — F068 Other specified mental disorders due to known physiological condition: Secondary | ICD-10-CM

## 2011-10-04 NOTE — Assessment & Plan Note (Signed)
DNR appropriate.  Discussed with son whether he would even want to Rx infections with antibiotics at this point

## 2011-10-04 NOTE — Assessment & Plan Note (Signed)
If antibiotic Rx going to continue consider suppressive Rx with macrodantin

## 2011-10-04 NOTE — Patient Instructions (Signed)
Your physician recommends that you schedule a follow-up appointment in: AS NEEDED  Your physician recommends that you continue on your current medications as directed. Please refer to the Current Medication list given to you today.  

## 2011-10-04 NOTE — Assessment & Plan Note (Signed)
Nothing to do different.  Can give extra dose of cardizem if HR over 110 but patient doesn't notice it.  Not a coumadin candidate  Advanced dementia and DNR

## 2011-10-04 NOTE — Progress Notes (Signed)
Patient ID: Stacey Huang, female   DOB: November 17, 1922, 78 y.o.   MRN: [id number] 78 yo frail demented female.  DNR with limited functioning ability and poor quality of life.  Chronic afib.  On dig and cardizem for rate control.  Frequent falls and not a coumadin candidate. Son is caretaker.  He sees her 2-3 times / week.  Frequent UTI;s and HR elevated when she gets sick  No syncope No chest pain and no dyspnea.  Not on macrodantin for suppresive Rx.  When she doesn't have UTI there are no HR issues.  Meds dispensed at facility / memory unit.  She is widowed for 6 years  ROS: Denies fever, malais, weight loss, blurry vision, decreased visual acuity, cough, sputum, SOB, hemoptysis, pleuritic pain, palpitaitons, heartburn, abdominal pain, melena, lower extremity edema, claudication, or rash.  All other systems reviewed and negative   General: Affect appropriate Healthy:  appears stated age HEENT: normal Neck supple with no adenopathy JVP normal no bruits no thyromegaly Lungs clear with no wheezing and good diaphragmatic motion Heart:  S1/S2 no murmur,rub, gallop or click PMI normal Abdomen: benighn, BS positve, no tenderness, no AAA no bruit.  No HSM or HJR Distal pulses intact with no bruits No edema Neuro non-focal Skin warm and dry No muscular weakness  Medications Current Outpatient Prescriptions  Medication Sig Dispense Refill  . acetaminophen (MAPAP) 325 MG tablet Take 650 mg by mouth every 6 (six) hours as needed.      Marland Kitchen aspirin 325 MG tablet Take 325 mg by mouth daily.      . calcitonin, salmon, (MIACALCIN/FORTICAL) 200 UNIT/ACT nasal spray Place 1 spray into the nose daily.      . Calcium Carbonate-Vitamin D (CALCIUM 600+D) 600-400 MG-UNIT per tablet Take 1 tablet by mouth daily.        . cholecalciferol (VITAMIN D) 1000 UNITS tablet Take 1,000 Units by mouth daily.        . Cranberry 500 MG CAPS Take 1 capsule by mouth 2 (two) times daily.      . cyanocobalamin 500 MCG tablet Take  500 mcg by mouth daily.      . digoxin (LANOXIN) 0.125 MG tablet Take 0.0625 mg by mouth daily. Pt takes 1/2 tab =0.0625 mg      . diltiazem (CARDIZEM) 60 MG tablet Take 1 tablet (60 mg total) by mouth every 6 (six) hours.  120 tablet  0  . Ensure Plus (ENSURE PLUS) LIQD Take 237 mLs by mouth daily at 12 noon. Chocolate ensure      . meloxicam (MOBIC) 7.5 MG tablet Take 7.5 mg by mouth daily.      . memantine (NAMENDA) 5 MG tablet Take 5 mg by mouth 2 (two) times daily.        . metoprolol tartrate (LOPRESSOR) 25 MG tablet Take 12.5 mg by mouth 2 (two) times daily. Pt receives 1/2 tab = 12.5mg       . traMADol (ULTRAM) 50 MG tablet Take 1 tablet (50 mg total) by mouth every 4 (four) hours as needed for pain. For pain  15 tablet  0    Allergies Review of patient's allergies indicates no known allergies.  Family History: Family History  Problem Relation Age of Onset  . Diabetes Mother     Social History: History   Social History  . Marital Status: Widowed    Spouse Name: N/A    Number of Children: N/A  . Years of Education: N/A   Occupational  History  . Not on file.   Social History Main Topics  . Smoking status: Never Smoker   . Smokeless tobacco: Never Used  . Alcohol Use: No  . Drug Use: No  . Sexually Active:    Other Topics Concern  . Not on file   Social History Narrative  . No narrative on file    Electrocardiogram:  Afib nonspecific ST/T wave changes  Assessment and Plan

## 2011-12-01 ENCOUNTER — Encounter (HOSPITAL_COMMUNITY): Payer: Self-pay | Admitting: *Deleted

## 2011-12-01 ENCOUNTER — Emergency Department (HOSPITAL_COMMUNITY): Payer: Medicare Other

## 2011-12-01 ENCOUNTER — Observation Stay (HOSPITAL_COMMUNITY)
Admission: EM | Admit: 2011-12-01 | Discharge: 2011-12-02 | Disposition: A | Discharge status: Skilled Nursing Facility | Payer: Medicare Other | Attending: Internal Medicine | Admitting: Internal Medicine

## 2011-12-01 DIAGNOSIS — K922 Gastrointestinal hemorrhage, unspecified: Principal | ICD-10-CM | POA: Insufficient documentation

## 2011-12-01 DIAGNOSIS — G309 Alzheimer's disease, unspecified: Secondary | ICD-10-CM | POA: Insufficient documentation

## 2011-12-01 DIAGNOSIS — S22080A Wedge compression fracture of T11-T12 vertebra, initial encounter for closed fracture: Secondary | ICD-10-CM

## 2011-12-01 DIAGNOSIS — Z9181 History of falling: Secondary | ICD-10-CM

## 2011-12-01 DIAGNOSIS — S0003XA Contusion of scalp, initial encounter: Secondary | ICD-10-CM | POA: Insufficient documentation

## 2011-12-01 DIAGNOSIS — M25559 Pain in unspecified hip: Secondary | ICD-10-CM | POA: Insufficient documentation

## 2011-12-01 DIAGNOSIS — S0033XA Contusion of nose, initial encounter: Secondary | ICD-10-CM

## 2011-12-01 DIAGNOSIS — D500 Iron deficiency anemia secondary to blood loss (chronic): Secondary | ICD-10-CM | POA: Insufficient documentation

## 2011-12-01 DIAGNOSIS — D472 Monoclonal gammopathy: Secondary | ICD-10-CM

## 2011-12-01 DIAGNOSIS — I517 Cardiomegaly: Secondary | ICD-10-CM | POA: Insufficient documentation

## 2011-12-01 DIAGNOSIS — N390 Urinary tract infection, site not specified: Secondary | ICD-10-CM

## 2011-12-01 DIAGNOSIS — M47812 Spondylosis without myelopathy or radiculopathy, cervical region: Secondary | ICD-10-CM | POA: Insufficient documentation

## 2011-12-01 DIAGNOSIS — S0083XA Contusion of other part of head, initial encounter: Secondary | ICD-10-CM | POA: Insufficient documentation

## 2011-12-01 DIAGNOSIS — I6529 Occlusion and stenosis of unspecified carotid artery: Secondary | ICD-10-CM | POA: Insufficient documentation

## 2011-12-01 DIAGNOSIS — M949 Disorder of cartilage, unspecified: Secondary | ICD-10-CM | POA: Insufficient documentation

## 2011-12-01 DIAGNOSIS — S4292XA Fracture of left shoulder girdle, part unspecified, initial encounter for closed fracture: Secondary | ICD-10-CM

## 2011-12-01 DIAGNOSIS — W08XXXA Fall from other furniture, initial encounter: Secondary | ICD-10-CM | POA: Insufficient documentation

## 2011-12-01 DIAGNOSIS — Z79899 Other long term (current) drug therapy: Secondary | ICD-10-CM | POA: Insufficient documentation

## 2011-12-01 DIAGNOSIS — M899 Disorder of bone, unspecified: Secondary | ICD-10-CM | POA: Insufficient documentation

## 2011-12-01 DIAGNOSIS — R220 Localized swelling, mass and lump, head: Secondary | ICD-10-CM | POA: Insufficient documentation

## 2011-12-01 DIAGNOSIS — D649 Anemia, unspecified: Secondary | ICD-10-CM

## 2011-12-01 DIAGNOSIS — I4891 Unspecified atrial fibrillation: Secondary | ICD-10-CM | POA: Insufficient documentation

## 2011-12-01 DIAGNOSIS — F0280 Dementia in other diseases classified elsewhere without behavioral disturbance: Secondary | ICD-10-CM | POA: Insufficient documentation

## 2011-12-01 DIAGNOSIS — Y9210 Unspecified residential institution as the place of occurrence of the external cause: Secondary | ICD-10-CM | POA: Insufficient documentation

## 2011-12-01 DIAGNOSIS — F0390 Unspecified dementia without behavioral disturbance: Secondary | ICD-10-CM

## 2011-12-01 DIAGNOSIS — S0093XA Contusion of unspecified part of head, initial encounter: Secondary | ICD-10-CM

## 2011-12-01 DIAGNOSIS — K219 Gastro-esophageal reflux disease without esophagitis: Secondary | ICD-10-CM | POA: Diagnosis present

## 2011-12-01 DIAGNOSIS — R221 Localized swelling, mass and lump, neck: Secondary | ICD-10-CM | POA: Insufficient documentation

## 2011-12-01 DIAGNOSIS — W19XXXA Unspecified fall, initial encounter: Secondary | ICD-10-CM

## 2011-12-01 DIAGNOSIS — E876 Hypokalemia: Secondary | ICD-10-CM

## 2011-12-01 LAB — PROTIME-INR
INR: 0.96 (ref 0.00–1.49)
Prothrombin Time: 12.7 seconds (ref 11.6–15.2)

## 2011-12-01 LAB — COMPREHENSIVE METABOLIC PANEL
AST: 28 U/L (ref 0–37)
Albumin: 3.4 g/dL — ABNORMAL LOW (ref 3.5–5.2)
Alkaline Phosphatase: 89 U/L (ref 39–117)
BUN: 23 mg/dL (ref 6–23)
Chloride: 104 mEq/L (ref 96–112)
Potassium: 3.9 mEq/L (ref 3.5–5.1)
Total Bilirubin: 0.3 mg/dL (ref 0.3–1.2)
Total Protein: 7 g/dL (ref 6.0–8.3)

## 2011-12-01 LAB — CBC WITH DIFFERENTIAL/PLATELET
HCT: 24.5 % — ABNORMAL LOW (ref 36.0–46.0)
Hemoglobin: 7.5 g/dL — ABNORMAL LOW (ref 12.0–15.0)
Lymphs Abs: 1.8 10*3/uL (ref 0.7–4.0)
Monocytes Absolute: 0.5 10*3/uL (ref 0.1–1.0)
Monocytes Relative: 8 % (ref 3–12)
Neutro Abs: 3.1 10*3/uL (ref 1.7–7.7)
Neutrophils Relative %: 52 % (ref 43–77)
RBC: 3.26 MIL/uL — ABNORMAL LOW (ref 3.87–5.11)

## 2011-12-01 LAB — URINALYSIS, ROUTINE W REFLEX MICROSCOPIC
Bilirubin Urine: NEGATIVE
Glucose, UA: NEGATIVE mg/dL
Hgb urine dipstick: NEGATIVE
Specific Gravity, Urine: 1.015 (ref 1.005–1.030)
pH: 7 (ref 5.0–8.0)

## 2011-12-01 LAB — OCCULT BLOOD, POC DEVICE: Fecal Occult Bld: POSITIVE

## 2011-12-01 LAB — PREPARE RBC (CROSSMATCH)

## 2011-12-01 MED ORDER — DIGOXIN 0.0625 MG HALF TABLET
0.0625 mg | ORAL_TABLET | Freq: Every day | ORAL | Status: DC
  Administered 2011-12-02: 0.0625 mg via ORAL
  Filled 2011-12-01: qty 1

## 2011-12-01 MED ORDER — TRAMADOL HCL 50 MG PO TABS: 50.0000 mg | ORAL_TABLET | ORAL | Status: DC | PRN

## 2011-12-01 MED ORDER — DILTIAZEM HCL 60 MG PO TABS
60.0000 mg | ORAL_TABLET | Freq: Four times a day (QID) | ORAL | Status: DC
  Administered 2011-12-01 – 2011-12-02 (×4): 60 mg via ORAL
  Filled 2011-12-01 (×10): qty 1

## 2011-12-01 MED ORDER — CYANOCOBALAMIN 500 MCG PO TABS
500.0000 ug | ORAL_TABLET | Freq: Every day | ORAL | Status: DC
  Administered 2011-12-02: 500 ug via ORAL
  Filled 2011-12-01: qty 1

## 2011-12-01 MED ORDER — SODIUM CHLORIDE 0.9 % IV SOLN
1000.0000 mL | INTRAVENOUS | Status: DC
  Administered 2011-12-01: 1000 mL via INTRAVENOUS

## 2011-12-01 MED ORDER — CALCITONIN (SALMON) 200 UNIT/ACT NA SOLN
1.0000 | Freq: Every day | NASAL | Status: DC
  Administered 2011-12-02: 1 via NASAL
  Filled 2011-12-01: qty 3.7

## 2011-12-01 MED ORDER — CALCIUM CARBONATE-VITAMIN D 500-200 MG-UNIT PO TABS
1.0000 | ORAL_TABLET | Freq: Every day | ORAL | Status: DC
  Administered 2011-12-02: 1 via ORAL
  Filled 2011-12-01: qty 1

## 2011-12-01 MED ORDER — VITAMIN D3 25 MCG (1000 UNIT) PO TABS
1000.0000 [IU] | ORAL_TABLET | Freq: Every day | ORAL | Status: DC
  Administered 2011-12-02: 1000 [IU] via ORAL
  Filled 2011-12-01: qty 1

## 2011-12-01 MED ORDER — METOPROLOL TARTRATE 12.5 MG HALF TABLET
12.5000 mg | ORAL_TABLET | Freq: Two times a day (BID) | ORAL | Status: DC
  Administered 2011-12-01 – 2011-12-02 (×2): 12.5 mg via ORAL
  Filled 2011-12-01 (×3): qty 1

## 2011-12-01 MED ORDER — ACETAMINOPHEN 325 MG PO TABS: 650.0000 mg | ORAL_TABLET | Freq: Four times a day (QID) | ORAL | Status: DC | PRN

## 2011-12-01 NOTE — ED Provider Notes (Signed)
History     CSN: [id number]  Arrival date & time 12/01/11  1031   First MD Initiated Contact with Patient 12/01/11 1032      Chief Complaint  Patient presents with  . Fall  . Facial Injury    History is limited by the patient's dementia and inability to speak History is provided  by EMS report Patient is a 78 y.o. female presenting with fall and facial injury.  Fall  Facial Injury    Patient presents to the emergency room after falling off a couch. Patient has a history of severe dementia. According to the nursing facility she does not verbally communicate. She generally sits with her eyes closed. She at times green stool and has history of falls. Patient apparently was sitting on a couch today. The staff heard her fall and found her lying on the floor with a hematoma to her for her head. She was sent to the emergent for further evaluation. There has been no vomiting. It is unclear if the patient's having any specific pain. Past Medical History  Diagnosis Date  . Alzheimer disease   . Atrial fibrillation   . Gastro - esophageal reflux disease   . Ischemic colitis, enteritis, or enterocolitis   . Dementia   . E-coli UTI   . Hypokalemia   . Nasal septal hematoma   . Shoulder fracture, left   . Compression fracture of T11 vertebra   . MGUS (monoclonal gammopathy of unknown significance)     Past Surgical History  Procedure Date  . Unknown   . Esophagogastroduodenoscopy 11/10/2010    Procedure: ESOPHAGOGASTRODUODENOSCOPY (EGD);  Surgeon: Theda Belfast;  Location: [facility];  Service: Endoscopy;  Laterality: N/A;  . Flexible sigmoidoscopy 11/10/2010    Procedure: FLEXIBLE SIGMOIDOSCOPY;  Surgeon: Theda Belfast;  Location: [facility];  Service: Gastroenterology;  Laterality: N/A;    Family History  Problem Relation Age of Onset  . Diabetes Mother     History  Substance Use Topics  . Smoking status: Never Smoker   . Smokeless tobacco: Never Used  . Alcohol Use:  No    OB History    Grav Para Term Preterm Abortions TAB SAB Ect Mult Living                  Review of Systems  Unable to perform ROS: Dementia    Allergies  Review of patient's allergies indicates no known allergies.  Home Medications   Current Outpatient Rx  Name  Route  Sig  Dispense  Refill  . ACETAMINOPHEN 325 MG PO TABS   Oral   Take 650 mg by mouth every 6 (six) hours as needed.         . ASPIRIN 325 MG PO TABS   Oral   Take 325 mg by mouth daily.         Marland Kitchen CALCITONIN (SALMON) 200 UNIT/ACT NA SOLN   Nasal   Place 1 spray into the nose daily.         Marland Kitchen CALCIUM CARBONATE-VITAMIN D 600-400 MG-UNIT PO TABS   Oral   Take 1 tablet by mouth daily.           Marland Kitchen VITAMIN D 1000 UNITS PO TABS   Oral   Take 1,000 Units by mouth daily.           Marland Kitchen CRANBERRY 500 MG PO CAPS   Oral   Take 1 capsule by mouth 2 (two) times daily.         Marland Kitchen  CYANOCOBALAMIN 500 MCG PO TABS   Oral   Take 500 mcg by mouth daily.         Marland Kitchen DIGOXIN 0.125 MG PO TABS   Oral   Take 0.0625 mg by mouth daily. Pt takes 1/2 tab =0.0625 mg         . DILTIAZEM HCL 60 MG PO TABS   Oral   Take 1 tablet (60 mg total) by mouth every 6 (six) hours.   120 tablet   0   . ENSURE PLUS PO LIQD   Oral   Take 237 mLs by mouth daily at 12 noon. Chocolate ensure         . MELOXICAM 7.5 MG PO TABS   Oral   Take 7.5 mg by mouth daily.         Marland Kitchen MEMANTINE HCL 5 MG PO TABS   Oral   Take 5 mg by mouth 2 (two) times daily.           Marland Kitchen METOPROLOL TARTRATE 25 MG PO TABS   Oral   Take 12.5 mg by mouth 2 (two) times daily. Pt receives 1/2 tab = 12.5mg          . TRAMADOL HCL 50 MG PO TABS   Oral   Take 1 tablet (50 mg total) by mouth every 4 (four) hours as needed for pain. For pain   15 tablet   0     BP 117/53  Pulse 65  Temp 98.2 F (36.8 C)  Resp 18  Physical Exam  Nursing note and vitals reviewed. Constitutional:       Frail  HENT:  Head: Normocephalic.  Right  Ear: External ear normal.  Left Ear: External ear normal.       Small hematoma and abrasion right forehead  Eyes: Conjunctivae normal are normal. Pupils are equal, round, and reactive to light. Right eye exhibits no discharge. Left eye exhibits no discharge. No scleral icterus.  Neck: Neck supple. No tracheal deviation present.       Cervical collar in place  Cardiovascular: Normal rate, regular rhythm and intact distal pulses.   Pulmonary/Chest: Effort normal and breath sounds normal. No stridor. No respiratory distress. She has no wheezes. She has no rales.  Abdominal: Soft. Bowel sounds are normal. She exhibits no distension. There is no tenderness. There is no rebound and no guarding.  Musculoskeletal: She exhibits no edema and no tenderness.       Right shoulder: She exhibits normal range of motion, no swelling and no deformity.       Left shoulder: She exhibits normal range of motion, no swelling and no deformity.       Right hip: She exhibits normal range of motion, no swelling and no deformity.       Left hip: She exhibits normal range of motion, no swelling and no deformity.       Cervical back: She exhibits no swelling and no deformity.       Thoracic back: She exhibits no swelling and no deformity.       Lumbar back: She exhibits no swelling and no deformity.  Neurological: She is alert. She has normal strength. No sensory deficit. Cranial nerve deficit:  no gross defecits noted. She exhibits normal muscle tone. She displays no seizure activity. Coordination normal.  Skin: Skin is warm and dry. No rash noted. She is not diaphoretic.  Psychiatric: She has a normal mood and affect.    ED Course  Procedures (including critical care time)  Rate: 101  Rhythm: atrial fibrillation  QRS Axis: normal  Intervals: normal  ST/T Wave abnormalities: borderline repol abnormality changes  Conduction Disutrbances:none  Narrative Interpretation: a fib without RVR  Old EKG Reviewed: no sig  changes  Labs Reviewed  COMPREHENSIVE METABOLIC PANEL - Abnormal; Notable for the following:    Albumin 3.4 (*)     GFR calc non Af Amer 73 (*)     GFR calc Af Amer 85 (*)     All other components within normal limits  CBC WITH DIFFERENTIAL - Abnormal; Notable for the following:    RBC 3.26 (*)     Hemoglobin 7.5 (*)     HCT 24.5 (*)     MCV 75.2 (*)     MCH 23.0 (*)     RDW 16.2 (*)     Eosinophils Relative 9 (*)     All other components within normal limits  PROTIME-INR  URINALYSIS, ROUTINE W REFLEX MICROSCOPIC  OCCULT BLOOD, POC DEVICE  PREPARE RBC (CROSSMATCH)  TYPE AND SCREEN   Dg Chest 1 View  12/01/2011  *RADIOLOGY REPORT*  Clinical Data: Fall.  Facial injury.  CHEST - 1 VIEW  Comparison: One-view chest 09/14/2011.  Findings: Cardiac enlargement is stable.  There is no edema or effusion to suggest failure.  Atherosclerotic changes of the aorta are again noted.  Post-traumatic changes in the proximal left humerus are evident.  Degenerative changes are in the [facility] joints bilaterally.  No acute abnormalities are present.  IMPRESSION:  1.  Stable cardiomegaly without failure. 2.  No acute cardiopulmonary disease.   Original Report Authenticated By: Marin Roberts, M.D.    Dg Hip Bilateral W/pelvis  12/01/2011  *RADIOLOGY REPORT*  Clinical Data: Fall.  Left hip pain.  BILATERAL HIP WITH PELVIS - 4+ VIEW  Comparison: CT of the abdomen pelvis 09/15/2011.  Findings: Moderate osteopenia is present.  Degenerative changes noted in the lower lumbar spine.  The right hip is located.  No acute bone or soft tissue abnormality is present.  The left hip is located.  No acute bone or soft tissue abnormality is present.  IMPRESSION:  1.  Moderate generalized osteopenia. 2.  No acute abnormality. 3.  Mild degenerative changes of the greater trochanter bilaterally.   Original Report Authenticated By: Marin Roberts, M.D.    Ct Head Wo Contrast  12/01/2011  *RADIOLOGY REPORT*  Clinical  Data:  Unwitnessed fall.  Facial injury.  CT HEAD WITHOUT CONTRAST CT CERVICAL SPINE WITHOUT CONTRAST  Technique:  Multidetector CT imaging of the head and cervical spine was performed following the standard protocol without intravenous contrast.  Multiplanar CT image reconstructions of the cervical spine were also generated.  Comparison:  CT head and cervical spine 09/14/2011.  CT HEAD  Findings: No acute cortical infarct, hemorrhage, or mass lesion is present.  Atrophy and extensive white matter disease is similar to the prior exam.  The ventricles are proportionate to the degree of atrophy.  No significant extra-axial fluid collection is present. Extensive white matter changes extend into the brain stem, as before.  Right supraorbital scalp soft tissue swelling is evident.  There is no underlying fracture.  Hyperostosis frontalis internus is again noted.  The paranasal sinuses and mastoid air cells are clear. Atherosclerotic calcifications are present within the cavernous carotid arteries bilaterally.  IMPRESSION:  1.  Stable atrophy and extensive white matter disease. 2.  No acute intracranial abnormality. 3.  Right supraorbital soft tissue swelling  without an underlying fracture.  CT CERVICAL SPINE  Findings: The cervical spine is imaged from skull base through T2- 3.  The vertebral body heights are maintained.  Slight degenerative anterolisthesis of C2-3 and C3-4 stable.  Prominent endplate degenerative changes at C5-6 greater than C6-7 are stable.  Soft tissue calcifications posterior to the spinous process of C6 is stable.  Dense calcifications are present about the right carotid bifurcation.  Pleural parenchymal scarring at the lung apices is stable.  IMPRESSION:  1.  No acute fracture or traumatic subluxation. 2.  Stable spondylosis of the cervical spine. 3.  Atherosclerosis.   Original Report Authenticated By: Marin Roberts, M.D.    Ct Cervical Spine Wo Contrast  12/01/2011  *RADIOLOGY REPORT*   Clinical Data:  Unwitnessed fall.  Facial injury.  CT HEAD WITHOUT CONTRAST CT CERVICAL SPINE WITHOUT CONTRAST  Technique:  Multidetector CT imaging of the head and cervical spine was performed following the standard protocol without intravenous contrast.  Multiplanar CT image reconstructions of the cervical spine were also generated.  Comparison:  CT head and cervical spine 09/14/2011.  CT HEAD  Findings: No acute cortical infarct, hemorrhage, or mass lesion is present.  Atrophy and extensive white matter disease is similar to the prior exam.  The ventricles are proportionate to the degree of atrophy.  No significant extra-axial fluid collection is present. Extensive white matter changes extend into the brain stem, as before.  Right supraorbital scalp soft tissue swelling is evident.  There is no underlying fracture.  Hyperostosis frontalis internus is again noted.  The paranasal sinuses and mastoid air cells are clear. Atherosclerotic calcifications are present within the cavernous carotid arteries bilaterally.  IMPRESSION:  1.  Stable atrophy and extensive white matter disease. 2.  No acute intracranial abnormality. 3.  Right supraorbital soft tissue swelling without an underlying fracture.  CT CERVICAL SPINE  Findings: The cervical spine is imaged from skull base through T2- 3.  The vertebral body heights are maintained.  Slight degenerative anterolisthesis of C2-3 and C3-4 stable.  Prominent endplate degenerative changes at C5-6 greater than C6-7 are stable.  Soft tissue calcifications posterior to the spinous process of C6 is stable.  Dense calcifications are present about the right carotid bifurcation.  Pleural parenchymal scarring at the lung apices is stable.  IMPRESSION:  1.  No acute fracture or traumatic subluxation. 2.  Stable spondylosis of the cervical spine. 3.  Atherosclerosis.   Original Report Authenticated By: Marin Roberts, M.D.      1. GI bleed   2. Anemia   3. Falls   4. Contusion  of head       MDM  History is very limited and the patient is not able to communicate with [place]. Exam is also somewhat difficult as I cannot really tell if she is having any pain or discomfort with my exam.  Fall: No sign of serious injury, fracture or dislocation associated with her fall.  GI Bleed/anemia: Pt has worsening anemia compared to prior labs.  Stools are positive for blood.  I have spoken with her son, he confirms that blood transfusion is OK.  He does reaffirm that she is DNR. Plan on blood transfusion. Admission.  Family will be in later this afternoon .  I did not discuss with them  whether they would want to proceed with GI evaluation.       Celene Kras, MD 12/01/11 [phone]

## 2011-12-01 NOTE — ED Notes (Signed)
Per EMS - Pt from [facility], was in day room, pt has tendency to lean while she sits up. Fall was not witnessed, lying in fetal position on her side upon arrival of EMS. Has abrasion/bruise to right side of forehead. Pt is alzheimer pt, fought w/ EMS when trying to get her off the floor.. BP - 132/78, HR- 74, Resp - 18, CBG - 89. Lung sounds clear and equal. Long spine board on arrival.

## 2011-12-01 NOTE — H&P (Signed)
History and Physical  Octa Santin BJY:[id number] DOB: 09/05/1922 DOA: 12/01/2011  Referring physician: Lynelle Doctor PCP: Florentina Jenny, MD   Chief Complaint: Fall  HPI:  Patient is an 78 year old white female with past medical history significant for Alzheimer's dementia atrial Fib and GERD. Patient resides at a nursing home. Patient was brought to the hospital from the nursing home after she fell off the couch. Patient was noted to have bruising on her forehead with a small hematoma. In the emergency room her hemoglobin was noted to be low at 7.5. She had a rectal exam done and was found to have guaiac positive stool but no gross blood. We were asked to admit for GI bleed. Patient is unable to give history secondary to dementia. Discussed plan of care with patient's son on the phone.  Chart Review:  Reviewed records from admission 10/2010 the patient came in at that time with GI bleed and was noted to have ischemic colitis on flexible sigmoidoscopy and a normal EGD done by Dr. Elnoria Howard.  Review of Systems:  Unable to obtain secondary to dementia  Past Medical History  Diagnosis Date  . Alzheimer disease   . Atrial fibrillation   . Gastro - esophageal reflux disease   . Ischemic colitis, enteritis, or enterocolitis   . Dementia   . E-coli UTI   . Hypokalemia   . Nasal septal hematoma   . Shoulder fracture, left   . Compression fracture of T11 vertebra   . MGUS (monoclonal gammopathy of unknown significance)     Past Surgical History  Procedure Date  . Unknown   . Esophagogastroduodenoscopy 11/10/2010    Procedure: ESOPHAGOGASTRODUODENOSCOPY (EGD);  Surgeon: Theda Belfast;  Location: [facility];  Service: Endoscopy;  Laterality: N/A;  . Flexible sigmoidoscopy 11/10/2010    Procedure: FLEXIBLE SIGMOIDOSCOPY;  Surgeon: Theda Belfast;  Location: [facility];  Service: Gastroenterology;  Laterality: N/A;    Social History: Reports that she has never smoked. She has never used smokeless  tobacco. She reports that she does not drink alcohol or use illicit drugs.  Lives in a Nursing home.   No Known Allergies  Family History  Problem Relation Age of Onset  . Diabetes Mother      Prior to Admission medications   Medication Sig Start Date End Date Taking? Authorizing Provider  acetaminophen (MAPAP) 325 MG tablet Take 650 mg by mouth every 6 (six) hours as needed.   Yes Historical Provider, MD  aspirin EC 81 MG tablet Take 81 mg by mouth daily.   Yes Historical Provider, MD  calcitonin, salmon, (MIACALCIN/FORTICAL) 200 UNIT/ACT nasal spray Place 1 spray into the nose daily.   Yes Historical Provider, MD  Calcium Carbonate-Vitamin D (CALCIUM 600+D) 600-400 MG-UNIT per tablet Take 1 tablet by mouth daily.     Yes Historical Provider, MD  cholecalciferol (VITAMIN D) 1000 UNITS tablet Take 1,000 Units by mouth daily.     Yes Historical Provider, MD  cyanocobalamin 500 MCG tablet Take 500 mcg by mouth daily.   Yes Historical Provider, MD  digoxin (LANOXIN) 0.125 MG tablet Take 0.0625 mg by mouth daily. Pt takes 1/2 tab =0.0625 mg   Yes Historical Provider, MD  diltiazem (CARDIZEM) 60 MG tablet Take 1 tablet (60 mg total) by mouth every 6 (six) hours. 08/13/11 08/12/12 Yes Jessica U Vann, DO  meloxicam (MOBIC) 7.5 MG tablet Take 7.5 mg by mouth daily.   Yes Historical Provider, MD  metoprolol tartrate (LOPRESSOR) 25 MG tablet Take 12.5 mg  by mouth 2 (two) times daily. Pt receives 1/2 tab = 12.5mg    Yes Historical Provider, MD  traMADol (ULTRAM) 50 MG tablet Take 1 tablet (50 mg total) by mouth every 4 (four) hours as needed for pain. For pain 08/13/11  Yes Joseph Art, DO   Physical Exam: Filed Vitals:   12/01/11 1304 12/01/11 1322 12/01/11 1400 12/01/11 1447  BP: 180/64 170/64 110/56 145/69  Pulse: 110 82  88  Temp:  98.3 F (36.8 C)  98 F (36.7 C)  TempSrc:    Axillary  Resp: 15 16 14 16   Height:    5\' 7"  (1.702 m)  Weight:    51.1 kg (112 lb 10.5 oz)  SpO2: 88% 96%  100%      General: Patient remained in bed. She will not open her eyes.   HEENT head normocephalic. She does have bruises on the forward with a small hematoma.  Cardiovascular: Regular rate rhythm.   Respiratory: Clear to auscultation bilaterally no wheezes rhonchi or rales   Abdomen: Soft nontender nondistended positive bowel sounds  Skin: Bruises and on the forehead,  Absent nail of the left great toe.   Musculoskeletal: Moves all extremities  Psychiatric: Patient with dementia   Neurologic: Grossly nonfocal   Wt Readings from Last 3 Encounters:  12/01/11 51.1 kg (112 lb 10.5 oz)  10/04/11 51.256 kg (113 lb)  08/08/11 49.7 kg (109 lb 9.1 oz)    Labs on Admission:  Basic Metabolic Panel:  Lab 12/01/11 1610  NA 139  K 3.9  CL 104  CO2 27  GLUCOSE 96  BUN 23  CREATININE 0.78  CALCIUM 9.0  MG --  PHOS --    Liver Function Tests:  Lab 12/01/11 1130  AST 28  ALT 15  ALKPHOS 89  BILITOT 0.3  PROT 7.0  ALBUMIN 3.4*    CBC:  Lab 12/01/11 1130  WBC 5.9  NEUTROABS 3.1  HGB 7.5*  HCT 24.5*  MCV 75.2*  PLT 375     Radiological Exams on Admission: Dg Chest 1 View  12/01/2011  *RADIOLOGY REPORT*  Clinical Data: Fall.  Facial injury.  CHEST - 1 VIEW  Comparison: One-view chest 09/14/2011.  Findings: Cardiac enlargement is stable.  There is no edema or effusion to suggest failure.  Atherosclerotic changes of the aorta are again noted.  Post-traumatic changes in the proximal left humerus are evident.  Degenerative changes are in the [facility] joints bilaterally.  No acute abnormalities are present.  IMPRESSION:  1.  Stable cardiomegaly without failure. 2.  No acute cardiopulmonary disease.   Original Report Authenticated By: Marin Roberts, M.D.    Dg Hip Bilateral W/pelvis  12/01/2011  *RADIOLOGY REPORT*  Clinical Data: Fall.  Left hip pain.  BILATERAL HIP WITH PELVIS - 4+ VIEW  Comparison: CT of the abdomen pelvis 09/15/2011.  Findings: Moderate osteopenia is  present.  Degenerative changes noted in the lower lumbar spine.  The right hip is located.  No acute bone or soft tissue abnormality is present.  The left hip is located.  No acute bone or soft tissue abnormality is present.  IMPRESSION:  1.  Moderate generalized osteopenia. 2.  No acute abnormality. 3.  Mild degenerative changes of the greater trochanter bilaterally.   Original Report Authenticated By: Marin Roberts, M.D.    Ct Head Wo Contrast  12/01/2011  *RADIOLOGY REPORT*  Clinical Data:  Unwitnessed fall.  Facial injury.  CT HEAD WITHOUT CONTRAST CT CERVICAL SPINE WITHOUT CONTRAST  Technique:  Multidetector CT imaging of the head and cervical spine was performed following the standard protocol without intravenous contrast.  Multiplanar CT image reconstructions of the cervical spine were also generated.  Comparison:  CT head and cervical spine 09/14/2011.  CT HEAD  Findings: No acute cortical infarct, hemorrhage, or mass lesion is present.  Atrophy and extensive white matter disease is similar to the prior exam.  The ventricles are proportionate to the degree of atrophy.  No significant extra-axial fluid collection is present. Extensive white matter changes extend into the brain stem, as before.  Right supraorbital scalp soft tissue swelling is evident.  There is no underlying fracture.  Hyperostosis frontalis internus is again noted.  The paranasal sinuses and mastoid air cells are clear. Atherosclerotic calcifications are present within the cavernous carotid arteries bilaterally.  IMPRESSION:  1.  Stable atrophy and extensive white matter disease. 2.  No acute intracranial abnormality. 3.  Right supraorbital soft tissue swelling without an underlying fracture.  CT CERVICAL SPINE  Findings: The cervical spine is imaged from skull base through T2- 3.  The vertebral body heights are maintained.  Slight degenerative anterolisthesis of C2-3 and C3-4 stable.  Prominent endplate degenerative changes at  C5-6 greater than C6-7 are stable.  Soft tissue calcifications posterior to the spinous process of C6 is stable.  Dense calcifications are present about the right carotid bifurcation.  Pleural parenchymal scarring at the lung apices is stable.  IMPRESSION:  1.  No acute fracture or traumatic subluxation. 2.  Stable spondylosis of the cervical spine. 3.  Atherosclerosis.   Original Report Authenticated By: Marin Roberts, M.D.    Ct Cervical Spine Wo Contrast  12/01/2011  *RADIOLOGY REPORT*  Clinical Data:  Unwitnessed fall.  Facial injury.  CT HEAD WITHOUT CONTRAST CT CERVICAL SPINE WITHOUT CONTRAST  Technique:  Multidetector CT imaging of the head and cervical spine was performed following the standard protocol without intravenous contrast.  Multiplanar CT image reconstructions of the cervical spine were also generated.  Comparison:  CT head and cervical spine 09/14/2011.  CT HEAD  Findings: No acute cortical infarct, hemorrhage, or mass lesion is present.  Atrophy and extensive white matter disease is similar to the prior exam.  The ventricles are proportionate to the degree of atrophy.  No significant extra-axial fluid collection is present. Extensive white matter changes extend into the brain stem, as before.  Right supraorbital scalp soft tissue swelling is evident.  There is no underlying fracture.  Hyperostosis frontalis internus is again noted.  The paranasal sinuses and mastoid air cells are clear. Atherosclerotic calcifications are present within the cavernous carotid arteries bilaterally.  IMPRESSION:  1.  Stable atrophy and extensive white matter disease. 2.  No acute intracranial abnormality. 3.  Right supraorbital soft tissue swelling without an underlying fracture.  CT CERVICAL SPINE  Findings: The cervical spine is imaged from skull base through T2- 3.  The vertebral body heights are maintained.  Slight degenerative anterolisthesis of C2-3 and C3-4 stable.  Prominent endplate degenerative  changes at C5-6 greater than C6-7 are stable.  Soft tissue calcifications posterior to the spinous process of C6 is stable.  Dense calcifications are present about the right carotid bifurcation.  Pleural parenchymal scarring at the lung apices is stable.  IMPRESSION:  1.  No acute fracture or traumatic subluxation. 2.  Stable spondylosis of the cervical spine. 3.  Atherosclerosis.   Original Report Authenticated By: Marin Roberts, M.D.     EKG: Independently reviewed.  Assessment/Plan GI Bleed Patient came in with possible GI bleed. She has guaiac-positive stool. Place her on PPI. Consulted GI. Will do H&H every 6 hours x4.  Atrial fibrillation Rate controlled. Patient is on aspirin daily. Will hold her aspirin. Continue other medications Cardizem digoxin and Lopressor. Will check dig level..  Gastroesophageal reflux disease PPI  Dementia/Alzheimer's dementia Patient seem to be at baseline  Normocytic anemia Patient's baseline hemoglobin seem to be 9-10. Will transfuse her 2 units of packed red blood cell. Anemia panel has been ordered. Discussed intervention with patient's son. He is ok with endoscopy. Will defer to GI since patient had endoscopy done last year.   Code Status: DNR Family Communication: Discussed with her son Disposition Plan/Anticipated LOS:2-3 days Time spent: 70 minutes  Molli Posey , MD  [facility] Team 3 Pager [phone]. If 7PM-7AM, please contact night-coverage at [email or web address], password [facility] 12/01/2011, 3:53 PM

## 2011-12-01 NOTE — ED Notes (Signed)
RN to obtain labs with start of IV 

## 2011-12-01 NOTE — ED Notes (Signed)
ZOX:[id number]<AV> Expected date:12/01/11<BR> Expected time:10:17 AM<BR> Means of arrival:<BR> Comments:<BR> Fall, hematoma

## 2011-12-02 DIAGNOSIS — F0280 Dementia in other diseases classified elsewhere without behavioral disturbance: Secondary | ICD-10-CM

## 2011-12-02 DIAGNOSIS — D500 Iron deficiency anemia secondary to blood loss (chronic): Secondary | ICD-10-CM | POA: Diagnosis present

## 2011-12-02 LAB — HEMOGLOBIN AND HEMATOCRIT, BLOOD
HCT: 33.5 % — ABNORMAL LOW (ref 36.0–46.0)
Hemoglobin: 10.8 g/dL — ABNORMAL LOW (ref 12.0–15.0)

## 2011-12-02 LAB — BASIC METABOLIC PANEL
GFR calc non Af Amer: 80 mL/min — ABNORMAL LOW (ref >90)
Glucose, Bld: 80 mg/dL (ref 70–99)
Potassium: 3.3 mEq/L — ABNORMAL LOW (ref 3.5–5.1)
Sodium: 137 mEq/L (ref 135–145)

## 2011-12-02 LAB — CBC
Hemoglobin: 10.9 g/dL — ABNORMAL LOW (ref 12.0–15.0)
MCHC: 32.1 g/dL (ref 30.0–36.0)
RBC: 4.37 MIL/uL (ref 3.87–5.11)
WBC: 7.4 10*3/uL (ref 4.0–10.5)

## 2011-12-02 LAB — RETICULOCYTES
RBC.: 4.36 MIL/uL (ref 3.87–5.11)
Retic Count, Absolute: 52.3 10*3/uL (ref 19.0–186.0)

## 2011-12-02 MED ORDER — HALOPERIDOL LACTATE 5 MG/ML IJ SOLN: 1.0000 mg | Freq: Four times a day (QID) | INTRAMUSCULAR | Status: DC | PRN

## 2011-12-02 NOTE — Progress Notes (Signed)
Pt to be d/c today to [facility].  Pt and family agreeable. Confirmed plans with facility.  Plan transfer via EMS.   Leron Croak, LCSWA [employer] [phone]

## 2011-12-02 NOTE — Progress Notes (Signed)
[facility] PROGRESS NOTE  Assessment/Plan: Atrial fibrillation () - Rate controlled. Patient is on aspirin daily.  - Continue other medications Cardizem digoxin and Lopressor  Gastroesophageal reflux disease () - PPI.  Alzheimer's dementia (11/22/2010) - at baseline. - haldol PRN  Blood loss anemia (12/02/2011)/Normocytic anemia (06/23/2011) - Fecal occult blood positive. Brown stool. Dementia, 78 years old. - previous flexisig 11.09, Ischemic colitis, hemorrhoids. MCV low (most likely iron deficiency)  probably chronic. - s/p 2 units, not the best candidate for flexi/sig procedure. Spoke with son who agree, he does not want any aggressive measures.   Code Status: DNR  Family Communication: Discussed with her son  Disposition Plan/Anticipated LOS:2-3 days  Consultants:  Dr. Elnoria Howard  Procedures:  none  Antibiotics:  None  HPI/Subjective: No complains  Objective: Filed Vitals:   12/01/11 2110 12/01/11 2210 12/02/11 0045 12/02/11 0500  BP: 132/69 131/66 142/57 115/55  Pulse: 75 62 73 70  Temp: 97.6 F (36.4 C) 97.9 F (36.6 C)  98.6 F (37 C)  TempSrc: Axillary Axillary  Axillary  Resp: 16 16  20   Height:      Weight:      SpO2:        Intake/Output Summary (Last 24 hours) at 12/02/11 0800 Last data filed at 12/02/11 0700  Gross per 24 hour  Intake 1826.25 ml  Output      7 ml  Net 1819.25 ml   Filed Weights   12/01/11 1447  Weight: 51.1 kg (112 lb 10.5 oz)    Exam:  General: Alert, awake, oriented x1, in no acute distress.  HEENT: No bruits, no goiter.  Heart: Regular rate and rhythm, without murmurs, rubs, gallops.  Lungs: Good air movement, clear to auscultation Abdomen: Soft, nontender, nondistended, positive bowel sounds.  Neuro: Grossly intact, nonfocal.   Data Reviewed: Basic Metabolic Panel:  Lab 12/02/11 1610 12/01/11 1130  NA 137 139  K 3.3* 3.9  CL 103 104  CO2 26 27  GLUCOSE 80 96  BUN 12 23  CREATININE 0.59 0.78    CALCIUM 9.0 9.0  MG -- --  PHOS -- --   Liver Function Tests:  Lab 12/01/11 1130  AST 28  ALT 15  ALKPHOS 89  BILITOT 0.3  PROT 7.0  ALBUMIN 3.4*   No results found for this basename: LIPASE:5,AMYLASE:5 in the last 168 hours No results found for this basename: AMMONIA:5 in the last 168 hours CBC:  Lab 12/02/11 0530 12/02/11 12/01/11 1130  WBC 7.4 -- 5.9  NEUTROABS -- -- 3.1  HGB 10.9* 10.8* 7.5*  HCT 34.0* 33.5* 24.5*  MCV 77.8* -- 75.2*  PLT 343 -- 375   Cardiac Enzymes: No results found for this basename: CKTOTAL:5,CKMB:5,CKMBINDEX:5,TROPONINI:5 in the last 168 hours BNP (last 3 results) No results found for this basename: PROBNP:3 in the last 8760 hours CBG: No results found for this basename: GLUCAP:5 in the last 168 hours  Recent Results (from the past 240 hour(s))  MRSA PCR SCREENING     Status: Normal   Collection Time   12/01/11  4:15 PM      Component Value Range Status Comment   MRSA by PCR NEGATIVE  NEGATIVE Final      Studies: Dg Chest 1 View  12/01/2011  *RADIOLOGY REPORT*  Clinical Data: Fall.  Facial injury.  CHEST - 1 VIEW  Comparison: One-view chest 09/14/2011.  Findings: Cardiac enlargement is stable.  There is no edema or effusion to suggest failure.  Atherosclerotic changes of  the aorta are again noted.  Post-traumatic changes in the proximal left humerus are evident.  Degenerative changes are in the [facility] joints bilaterally.  No acute abnormalities are present.  IMPRESSION:  1.  Stable cardiomegaly without failure. 2.  No acute cardiopulmonary disease.   Original Report Authenticated By: Marin Roberts, M.D.    Dg Hip Bilateral W/pelvis  12/01/2011  *RADIOLOGY REPORT*  Clinical Data: Fall.  Left hip pain.  BILATERAL HIP WITH PELVIS - 4+ VIEW  Comparison: CT of the abdomen pelvis 09/15/2011.  Findings: Moderate osteopenia is present.  Degenerative changes noted in the lower lumbar spine.  The right hip is located.  No acute bone or soft tissue  abnormality is present.  The left hip is located.  No acute bone or soft tissue abnormality is present.  IMPRESSION:  1.  Moderate generalized osteopenia. 2.  No acute abnormality. 3.  Mild degenerative changes of the greater trochanter bilaterally.   Original Report Authenticated By: Marin Roberts, M.D.    Ct Head Wo Contrast  12/01/2011  *RADIOLOGY REPORT*  Clinical Data:  Unwitnessed fall.  Facial injury.  CT HEAD WITHOUT CONTRAST CT CERVICAL SPINE WITHOUT CONTRAST  Technique:  Multidetector CT imaging of the head and cervical spine was performed following the standard protocol without intravenous contrast.  Multiplanar CT image reconstructions of the cervical spine were also generated.  Comparison:  CT head and cervical spine 09/14/2011.  CT HEAD  Findings: No acute cortical infarct, hemorrhage, or mass lesion is present.  Atrophy and extensive white matter disease is similar to the prior exam.  The ventricles are proportionate to the degree of atrophy.  No significant extra-axial fluid collection is present. Extensive white matter changes extend into the brain stem, as before.  Right supraorbital scalp soft tissue swelling is evident.  There is no underlying fracture.  Hyperostosis frontalis internus is again noted.  The paranasal sinuses and mastoid air cells are clear. Atherosclerotic calcifications are present within the cavernous carotid arteries bilaterally.  IMPRESSION:  1.  Stable atrophy and extensive white matter disease. 2.  No acute intracranial abnormality. 3.  Right supraorbital soft tissue swelling without an underlying fracture.  CT CERVICAL SPINE  Findings: The cervical spine is imaged from skull base through T2- 3.  The vertebral body heights are maintained.  Slight degenerative anterolisthesis of C2-3 and C3-4 stable.  Prominent endplate degenerative changes at C5-6 greater than C6-7 are stable.  Soft tissue calcifications posterior to the spinous process of C6 is stable.  Dense  calcifications are present about the right carotid bifurcation.  Pleural parenchymal scarring at the lung apices is stable.  IMPRESSION:  1.  No acute fracture or traumatic subluxation. 2.  Stable spondylosis of the cervical spine. 3.  Atherosclerosis.   Original Report Authenticated By: Marin Roberts, M.D.    Ct Cervical Spine Wo Contrast  12/01/2011  *RADIOLOGY REPORT*  Clinical Data:  Unwitnessed fall.  Facial injury.  CT HEAD WITHOUT CONTRAST CT CERVICAL SPINE WITHOUT CONTRAST  Technique:  Multidetector CT imaging of the head and cervical spine was performed following the standard protocol without intravenous contrast.  Multiplanar CT image reconstructions of the cervical spine were also generated.  Comparison:  CT head and cervical spine 09/14/2011.  CT HEAD  Findings: No acute cortical infarct, hemorrhage, or mass lesion is present.  Atrophy and extensive white matter disease is similar to the prior exam.  The ventricles are proportionate to the degree of atrophy.  No significant extra-axial fluid collection is  present. Extensive white matter changes extend into the brain stem, as before.  Right supraorbital scalp soft tissue swelling is evident.  There is no underlying fracture.  Hyperostosis frontalis internus is again noted.  The paranasal sinuses and mastoid air cells are clear. Atherosclerotic calcifications are present within the cavernous carotid arteries bilaterally.  IMPRESSION:  1.  Stable atrophy and extensive white matter disease. 2.  No acute intracranial abnormality. 3.  Right supraorbital soft tissue swelling without an underlying fracture.  CT CERVICAL SPINE  Findings: The cervical spine is imaged from skull base through T2- 3.  The vertebral body heights are maintained.  Slight degenerative anterolisthesis of C2-3 and C3-4 stable.  Prominent endplate degenerative changes at C5-6 greater than C6-7 are stable.  Soft tissue calcifications posterior to the spinous process of C6 is  stable.  Dense calcifications are present about the right carotid bifurcation.  Pleural parenchymal scarring at the lung apices is stable.  IMPRESSION:  1.  No acute fracture or traumatic subluxation. 2.  Stable spondylosis of the cervical spine. 3.  Atherosclerosis.   Original Report Authenticated By: Marin Roberts, M.D.     Scheduled Meds:    . calcitonin (salmon)  1 spray Alternating Nares Daily  . calcium-vitamin D  1 tablet Oral Daily  . cholecalciferol  1,000 Units Oral Daily  . cyanocobalamin  500 mcg Oral Daily  . digoxin  0.0625 mg Oral Daily  . diltiazem  60 mg Oral Q6H  . metoprolol tartrate  12.5 mg Oral BID   Continuous Infusions:    . sodium chloride 1,000 mL (12/01/11 2239)  . [DISCONTINUED] sodium chloride 1,000 mL (12/01/11 1146)     Marinda Elk  [facility] Pager [phone]. If 8PM-8AM, please contact night-coverage at [email or web address], password [facility] 12/02/2011, 8:00 AM  LOS: 1 day

## 2011-12-02 NOTE — Discharge Summary (Signed)
Physician Discharge Summary  Stacey Huang JYN:[id number] DOB: September 25, 1922 DOA: 12/01/2011  PCP: Florentina Jenny, MD  Admit date: 12/01/2011 Discharge date: 12/02/2011  Time spent: 30 minutes  Recommendations for Outpatient Follow-up:  1. Follow up with Dr. Redmond School in 4 weeks check Hbg  Discharge Diagnoses:  Active Problems:  Atrial fibrillation  Gastroesophageal reflux disease  Dementia  Alzheimer's dementia  Normocytic anemia  Blood loss anemia   Discharge Condition: guarded  Diet recommendation: regular  Filed Weights   12/01/11 1447  Weight: 51.1 kg (112 lb 10.5 oz)    History of present illness:  78 year old white female with past medical history significant for Alzheimer's dementia atrial Fib and GERD. Patient resides at a nursing home. Patient was brought to the hospital from the nursing home after she fell off the couch. Patient was noted to have bruising on her forehead with a small hematoma. In the emergency room her hemoglobin was noted to be low at 7.5. She had a rectal exam done and was found to have guaiac positive stool but no gross blood. We were asked to admit for GI bleed. Patient is unable to give history secondary to dementia. Discussed plan of care with patient's son on the phone.   Hospital Course:  Atrial fibrillation () - Rate controlled. Patient is on aspirin daily.  - Continue other medications Cardizem digoxin and Lopressor. - dig level therapeutic.  Gastroesophageal reflux disease () - PPI.   Alzheimer's dementia (11/22/2010) - at baseline.  - haldol PRN   Blood loss anemia (12/02/2011)/Normocytic anemia (06/23/2011) - Fecal occult blood positive. Brown stool. Dementia, 78 years old.  - previous flexisig 11.09, Ischemic colitis, hemorrhoids. MCV low (most likely iron deficiency) probably chronic.  - s/p 2 units, not the best candidate for flexi/sig procedure. Spoke with son who agree, he does not want any aggressive measures.     Procedures:  none (i.e. Studies not automatically included, echos, thoracentesis, etc; not x-rays)  Consultations:  Dr. Loreta Ave  Discharge Exam: Filed Vitals:   12/01/11 2110 12/01/11 2210 12/02/11 0045 12/02/11 0500  BP: 132/69 131/66 142/57 115/55  Pulse: 75 62 73 70  Temp: 97.6 F (36.4 C) 97.9 F (36.6 C)  98.6 F (37 C)  TempSrc: Axillary Axillary  Axillary  Resp: 16 16  20   Height:      Weight:      SpO2:        General: A&ox1  Cardiovascular: IRR Respiratory: good air movement CTA B/L  Discharge Instructions  Discharge Orders    Future Orders Please Complete By Expires   Diet - low sodium heart healthy      Increase activity slowly          Medication List     As of 12/02/2011  8:13 AM    STOP taking these medications         meloxicam 7.5 MG tablet   Commonly known as: MOBIC      TAKE these medications         aspirin EC 81 MG tablet   Take 81 mg by mouth daily.      calcitonin (salmon) 200 UNIT/ACT nasal spray   Commonly known as: MIACALCIN/FORTICAL   Place 1 spray into the nose daily.      Calcium 600+D 600-400 MG-UNIT per tablet   Generic drug: Calcium Carbonate-Vitamin D   Take 1 tablet by mouth daily.      cholecalciferol 1000 UNITS tablet   Commonly known as: VITAMIN D  Take 1,000 Units by mouth daily.      cyanocobalamin 500 MCG tablet   Take 500 mcg by mouth daily.      digoxin 0.125 MG tablet   Commonly known as: LANOXIN   Take 0.0625 mg by mouth daily. Pt takes 1/2 tab =0.0625 mg      diltiazem 60 MG tablet   Commonly known as: CARDIZEM   Take 1 tablet (60 mg total) by mouth every 6 (six) hours.      MAPAP 325 MG tablet   Generic drug: acetaminophen   Take 650 mg by mouth every 6 (six) hours as needed.      metoprolol tartrate 25 MG tablet   Commonly known as: LOPRESSOR   Take 12.5 mg by mouth 2 (two) times daily. Pt receives 1/2 tab = 12.5mg       traMADol 50 MG tablet   Commonly known as: ULTRAM   Take 1 tablet  (50 mg total) by mouth every 4 (four) hours as needed for pain. For pain           Follow-up Information    Follow up with Florentina Jenny, MD. In 5 weeks. (hospital follow up)    Contact information:   [address] [place] [address] [phone]           The results of significant diagnostics from this hospitalization (including imaging, microbiology, ancillary and laboratory) are listed below for reference.    Significant Diagnostic Studies: Dg Chest 1 View  12/01/2011  *RADIOLOGY REPORT*  Clinical Data: Fall.  Facial injury.  CHEST - 1 VIEW  Comparison: One-view chest 09/14/2011.  Findings: Cardiac enlargement is stable.  There is no edema or effusion to suggest failure.  Atherosclerotic changes of the aorta are again noted.  Post-traumatic changes in the proximal left humerus are evident.  Degenerative changes are in the [facility] joints bilaterally.  No acute abnormalities are present.  IMPRESSION:  1.  Stable cardiomegaly without failure. 2.  No acute cardiopulmonary disease.   Original Report Authenticated By: Marin Roberts, M.D.    Dg Hip Bilateral W/pelvis  12/01/2011  *RADIOLOGY REPORT*  Clinical Data: Fall.  Left hip pain.  BILATERAL HIP WITH PELVIS - 4+ VIEW  Comparison: CT of the abdomen pelvis 09/15/2011.  Findings: Moderate osteopenia is present.  Degenerative changes noted in the lower lumbar spine.  The right hip is located.  No acute bone or soft tissue abnormality is present.  The left hip is located.  No acute bone or soft tissue abnormality is present.  IMPRESSION:  1.  Moderate generalized osteopenia. 2.  No acute abnormality. 3.  Mild degenerative changes of the greater trochanter bilaterally.   Original Report Authenticated By: Marin Roberts, M.D.    Ct Head Wo Contrast  12/01/2011  *RADIOLOGY REPORT*  Clinical Data:  Unwitnessed fall.  Facial injury.  CT HEAD WITHOUT CONTRAST CT CERVICAL SPINE WITHOUT CONTRAST  Technique:  Multidetector  CT imaging of the head and cervical spine was performed following the standard protocol without intravenous contrast.  Multiplanar CT image reconstructions of the cervical spine were also generated.  Comparison:  CT head and cervical spine 09/14/2011.  CT HEAD  Findings: No acute cortical infarct, hemorrhage, or mass lesion is present.  Atrophy and extensive white matter disease is similar to the prior exam.  The ventricles are proportionate to the degree of atrophy.  No significant extra-axial fluid collection is present. Extensive white matter changes extend into the brain stem,  as before.  Right supraorbital scalp soft tissue swelling is evident.  There is no underlying fracture.  Hyperostosis frontalis internus is again noted.  The paranasal sinuses and mastoid air cells are clear. Atherosclerotic calcifications are present within the cavernous carotid arteries bilaterally.  IMPRESSION:  1.  Stable atrophy and extensive white matter disease. 2.  No acute intracranial abnormality. 3.  Right supraorbital soft tissue swelling without an underlying fracture.  CT CERVICAL SPINE  Findings: The cervical spine is imaged from skull base through T2- 3.  The vertebral body heights are maintained.  Slight degenerative anterolisthesis of C2-3 and C3-4 stable.  Prominent endplate degenerative changes at C5-6 greater than C6-7 are stable.  Soft tissue calcifications posterior to the spinous process of C6 is stable.  Dense calcifications are present about the right carotid bifurcation.  Pleural parenchymal scarring at the lung apices is stable.  IMPRESSION:  1.  No acute fracture or traumatic subluxation. 2.  Stable spondylosis of the cervical spine. 3.  Atherosclerosis.   Original Report Authenticated By: Marin Roberts, M.D.    Ct Cervical Spine Wo Contrast  12/01/2011  *RADIOLOGY REPORT*  Clinical Data:  Unwitnessed fall.  Facial injury.  CT HEAD WITHOUT CONTRAST CT CERVICAL SPINE WITHOUT CONTRAST  Technique:   Multidetector CT imaging of the head and cervical spine was performed following the standard protocol without intravenous contrast.  Multiplanar CT image reconstructions of the cervical spine were also generated.  Comparison:  CT head and cervical spine 09/14/2011.  CT HEAD  Findings: No acute cortical infarct, hemorrhage, or mass lesion is present.  Atrophy and extensive white matter disease is similar to the prior exam.  The ventricles are proportionate to the degree of atrophy.  No significant extra-axial fluid collection is present. Extensive white matter changes extend into the brain stem, as before.  Right supraorbital scalp soft tissue swelling is evident.  There is no underlying fracture.  Hyperostosis frontalis internus is again noted.  The paranasal sinuses and mastoid air cells are clear. Atherosclerotic calcifications are present within the cavernous carotid arteries bilaterally.  IMPRESSION:  1.  Stable atrophy and extensive white matter disease. 2.  No acute intracranial abnormality. 3.  Right supraorbital soft tissue swelling without an underlying fracture.  CT CERVICAL SPINE  Findings: The cervical spine is imaged from skull base through T2- 3.  The vertebral body heights are maintained.  Slight degenerative anterolisthesis of C2-3 and C3-4 stable.  Prominent endplate degenerative changes at C5-6 greater than C6-7 are stable.  Soft tissue calcifications posterior to the spinous process of C6 is stable.  Dense calcifications are present about the right carotid bifurcation.  Pleural parenchymal scarring at the lung apices is stable.  IMPRESSION:  1.  No acute fracture or traumatic subluxation. 2.  Stable spondylosis of the cervical spine. 3.  Atherosclerosis.   Original Report Authenticated By: Marin Roberts, M.D.     Microbiology: Recent Results (from the past 240 hour(s))  MRSA PCR SCREENING     Status: Normal   Collection Time   12/01/11  4:15 PM      Component Value Range Status  Comment   MRSA by PCR NEGATIVE  NEGATIVE Final      Labs: Basic Metabolic Panel:  Lab 12/02/11 4098 12/01/11 1130  NA 137 139  K 3.3* 3.9  CL 103 104  CO2 26 27  GLUCOSE 80 96  BUN 12 23  CREATININE 0.59 0.78  CALCIUM 9.0 9.0  MG -- --  PHOS -- --  Liver Function Tests:  Lab 12/01/11 1130  AST 28  ALT 15  ALKPHOS 89  BILITOT 0.3  PROT 7.0  ALBUMIN 3.4*   No results found for this basename: LIPASE:5,AMYLASE:5 in the last 168 hours No results found for this basename: AMMONIA:5 in the last 168 hours CBC:  Lab 12/02/11 0530 12/02/11 12/01/11 1130  WBC 7.4 -- 5.9  NEUTROABS -- -- 3.1  HGB 10.9* 10.8* 7.5*  HCT 34.0* 33.5* 24.5*  MCV 77.8* -- 75.2*  PLT 343 -- 375   Cardiac Enzymes: No results found for this basename: CKTOTAL:5,CKMB:5,CKMBINDEX:5,TROPONINI:5 in the last 168 hours BNP: BNP (last 3 results) No results found for this basename: PROBNP:3 in the last 8760 hours CBG: No results found for this basename: GLUCAP:5 in the last 168 hours     Signed:  Marinda Elk  [facility] 12/02/2011, 8:13 AM

## 2011-12-03 LAB — TYPE AND SCREEN
ABO/RH(D): A POS
Antibody Screen: NEGATIVE
Unit division: 0
Unit division: 0

## 2011-12-03 LAB — FOLATE: Folate: 12.6 ng/mL

## 2011-12-03 LAB — VITAMIN B12: Vitamin B-12: 474 pg/mL (ref 211–911)

## 2012-05-03 ENCOUNTER — Emergency Department (HOSPITAL_COMMUNITY)
Admission: EM | Admit: 2012-05-03 | Discharge: 2012-05-03 | Disposition: A | Discharge status: Skilled Nursing Facility | Payer: Medicare Other | Attending: Emergency Medicine | Admitting: Emergency Medicine

## 2012-05-03 ENCOUNTER — Emergency Department (HOSPITAL_COMMUNITY): Payer: Medicare Other

## 2012-05-03 ENCOUNTER — Encounter (HOSPITAL_COMMUNITY): Payer: Self-pay | Admitting: *Deleted

## 2012-05-03 DIAGNOSIS — Z8639 Personal history of other endocrine, nutritional and metabolic disease: Secondary | ICD-10-CM | POA: Insufficient documentation

## 2012-05-03 DIAGNOSIS — I4891 Unspecified atrial fibrillation: Secondary | ICD-10-CM | POA: Insufficient documentation

## 2012-05-03 DIAGNOSIS — Z8619 Personal history of other infectious and parasitic diseases: Secondary | ICD-10-CM | POA: Insufficient documentation

## 2012-05-03 DIAGNOSIS — M25469 Effusion, unspecified knee: Secondary | ICD-10-CM

## 2012-05-03 DIAGNOSIS — F0280 Dementia in other diseases classified elsewhere without behavioral disturbance: Secondary | ICD-10-CM | POA: Insufficient documentation

## 2012-05-03 DIAGNOSIS — Z79899 Other long term (current) drug therapy: Secondary | ICD-10-CM | POA: Insufficient documentation

## 2012-05-03 DIAGNOSIS — Z8719 Personal history of other diseases of the digestive system: Secondary | ICD-10-CM | POA: Insufficient documentation

## 2012-05-03 DIAGNOSIS — G309 Alzheimer's disease, unspecified: Secondary | ICD-10-CM | POA: Insufficient documentation

## 2012-05-03 DIAGNOSIS — Z87828 Personal history of other (healed) physical injury and trauma: Secondary | ICD-10-CM | POA: Insufficient documentation

## 2012-05-03 DIAGNOSIS — Z7982 Long term (current) use of aspirin: Secondary | ICD-10-CM | POA: Insufficient documentation

## 2012-05-03 DIAGNOSIS — Z8781 Personal history of (healed) traumatic fracture: Secondary | ICD-10-CM | POA: Insufficient documentation

## 2012-05-03 DIAGNOSIS — F0390 Unspecified dementia without behavioral disturbance: Secondary | ICD-10-CM | POA: Insufficient documentation

## 2012-05-03 DIAGNOSIS — Z862 Personal history of diseases of the blood and blood-forming organs and certain disorders involving the immune mechanism: Secondary | ICD-10-CM | POA: Insufficient documentation

## 2012-05-03 NOTE — ED Notes (Signed)
[facility] contacted for transport 

## 2012-05-03 NOTE — ED Notes (Signed)
Patient transported to X-ray 

## 2012-05-03 NOTE — ED Provider Notes (Signed)
History     CSN: [id number]  Arrival date & time 05/03/12  1404   First MD Initiated Contact with Patient 05/03/12 1504      Chief Complaint  Patient presents with  . Joint Swelling    RT knee   Level V Caveat: Dementia  HPI Sent from memory care unit for new right knee joint effusion. No report of trauma or fever. Pt without complaint. Hx of dementia. Unable to provide additional hx. Review of records demonstrates hx of afib not on anticoagulation and no hx of gout.    Past Medical History  Diagnosis Date  . Alzheimer disease   . Atrial fibrillation   . Gastro - esophageal reflux disease   . Ischemic colitis, enteritis, or enterocolitis   . Dementia   . E-coli UTI   . Hypokalemia   . Nasal septal hematoma   . Shoulder fracture, left   . Compression fracture of T11 vertebra   . MGUS (monoclonal gammopathy of unknown significance)     Past Surgical History  Procedure Laterality Date  . Unknown    . Esophagogastroduodenoscopy  11/10/2010    Procedure: ESOPHAGOGASTRODUODENOSCOPY (EGD);  Surgeon: Theda Belfast;  Location: [facility];  Service: Endoscopy;  Laterality: N/A;  . Flexible sigmoidoscopy  11/10/2010    Procedure: FLEXIBLE SIGMOIDOSCOPY;  Surgeon: Theda Belfast;  Location: [facility];  Service: Gastroenterology;  Laterality: N/A;    Family History  Problem Relation Age of Onset  . Diabetes Mother     History  Substance Use Topics  . Smoking status: Never Smoker   . Smokeless tobacco: Never Used  . Alcohol Use: No    OB History   Grav Para Term Preterm Abortions TAB SAB Ect Mult Living                  Review of Systems  Unable to perform ROS: Dementia    Allergies  Review of patient's allergies indicates no known allergies.  Home Medications   Current Outpatient Rx  Name  Route  Sig  Dispense  Refill  . acetaminophen (MAPAP) 325 MG tablet   Oral   Take 650 mg by mouth every 6 (six) hours as needed for pain.          Marland Kitchen aspirin 81 MG  chewable tablet   Oral   Chew 81 mg by mouth daily.         . Calcium Carbonate-Vitamin D (CALCIUM 600+D) 600-400 MG-UNIT per tablet   Oral   Take 1 tablet by mouth daily.           . cholecalciferol (VITAMIN D) 1000 UNITS tablet   Oral   Take 1,000 Units by mouth daily.           . cyanocobalamin 500 MCG tablet   Oral   Take 500 mcg by mouth daily.         . digoxin (LANOXIN) 0.125 MG tablet   Oral   Take 0.0625 mg by mouth daily. Pt takes 1/2 tab =0.0625 mg         . diltiazem (CARDIZEM) 60 MG tablet   Oral   Take 1 tablet (60 mg total) by mouth every 6 (six) hours.   120 tablet   0   . metoprolol tartrate (LOPRESSOR) 25 MG tablet   Oral   Take 12.5 mg by mouth 2 (two) times daily. Pt receives 1/2 tab = 12.5mg          . traMADol (  ULTRAM) 50 MG tablet   Oral   Take 50 mg by mouth every 6 (six) hours as needed for pain.           BP 137/53  Pulse 77  Temp(Src) 97.8 F (36.6 C) (Oral)  Resp 18  SpO2 100%  Physical Exam  Nursing note and vitals reviewed. Constitutional: Vital signs are normal. She appears well-developed and well-nourished. No distress.  HENT:  Head: Normocephalic and atraumatic.  Eyes: EOM are normal.  Neck: Normal range of motion.  Cardiovascular: Normal rate, regular rhythm and normal heart sounds.   Pulmonary/Chest: Effort normal and breath sounds normal.  Abdominal: Soft. She exhibits no distension. There is no tenderness.  Musculoskeletal: Normal range of motion.  Mild right knee effusion without erythema or significant warmth. Right foot with normal DP and PT pulses. Full ROM of right knee does not seem to elicit any pain. No point tenderness. No cellulitis of abscess noted around right knee. Normal ability to flex at the right knee  Neurological: She is alert.  Skin: Skin is warm and dry.  Psychiatric: She has a normal mood and affect. Judgment normal.    ED Course  Procedures (including critical care time)  Labs  Reviewed - No data to display Dg Knee Complete 4 Views Right  05/03/2012  *RADIOLOGY REPORT*  Clinical Data: swelling, no known injury  RIGHT KNEE - COMPLETE 4+ VIEW  Comparison: None.  Findings: Four views of the right knee submitted.  No acute fracture or subluxation.  Chondrocalcinosis.  Diffuse osteopenia. Moderate joint effusion.  Narrowing of medial joint compartment.  IMPRESSION: No acute fracture or subluxation.  Joint effusion.  Diffuse osteopenia.  Chondrocalcinosis. Degenerative changes as described above.   Original Report Authenticated By: Natasha Mead, M.D.    I personally reviewed the imaging tests through PACS system I reviewed available ER/hospitalization records through the EMR   1. Joint effusion of knee       MDM  Isolated right knee effusion in the setting of arthritis. Doubt septic arthritis. No significant warmth or erythema. Able to fully range her right knee without eliciting pain. Normal pulses. Orthopedic follow up. Conservative treatment at this time. I chose not to tap the effusion at this time as I think the risk outweighs the benefit. Strict return precautions given in discharge instructions.         Lyanne Co, MD 05/03/12 [phone]

## 2012-05-03 NOTE — ED Notes (Signed)
Staff at [facility] located on [address] . Staff called EMS because of New onset of swelling of RT knee . No known witnessed fall .

## 2013-07-30 ENCOUNTER — Emergency Department (HOSPITAL_COMMUNITY): Payer: Medicare Other

## 2013-07-30 ENCOUNTER — Observation Stay (HOSPITAL_COMMUNITY)
Admission: EM | Admit: 2013-07-30 | Discharge: 2013-07-31 | Disposition: A | Discharge status: Nursing Home (Includes ICF) | Payer: Medicare Other | Attending: Internal Medicine | Admitting: Internal Medicine

## 2013-07-30 ENCOUNTER — Encounter (HOSPITAL_COMMUNITY): Payer: Self-pay | Admitting: Emergency Medicine

## 2013-07-30 DIAGNOSIS — Z79899 Other long term (current) drug therapy: Secondary | ICD-10-CM | POA: Diagnosis not present

## 2013-07-30 DIAGNOSIS — F0390 Unspecified dementia without behavioral disturbance: Secondary | ICD-10-CM | POA: Diagnosis present

## 2013-07-30 DIAGNOSIS — K219 Gastro-esophageal reflux disease without esophagitis: Secondary | ICD-10-CM | POA: Diagnosis present

## 2013-07-30 DIAGNOSIS — R195 Other fecal abnormalities: Secondary | ICD-10-CM | POA: Diagnosis present

## 2013-07-30 DIAGNOSIS — K559 Vascular disorder of intestine, unspecified: Secondary | ICD-10-CM | POA: Diagnosis not present

## 2013-07-30 DIAGNOSIS — Z7982 Long term (current) use of aspirin: Secondary | ICD-10-CM | POA: Diagnosis not present

## 2013-07-30 DIAGNOSIS — K922 Gastrointestinal hemorrhage, unspecified: Principal | ICD-10-CM | POA: Diagnosis present

## 2013-07-30 DIAGNOSIS — Z66 Do not resuscitate: Secondary | ICD-10-CM | POA: Insufficient documentation

## 2013-07-30 DIAGNOSIS — F0280 Dementia in other diseases classified elsewhere without behavioral disturbance: Secondary | ICD-10-CM | POA: Diagnosis not present

## 2013-07-30 DIAGNOSIS — I4891 Unspecified atrial fibrillation: Secondary | ICD-10-CM | POA: Diagnosis not present

## 2013-07-30 DIAGNOSIS — G309 Alzheimer's disease, unspecified: Secondary | ICD-10-CM | POA: Insufficient documentation

## 2013-07-30 DIAGNOSIS — E875 Hyperkalemia: Secondary | ICD-10-CM | POA: Diagnosis present

## 2013-07-30 DIAGNOSIS — Z9181 History of falling: Secondary | ICD-10-CM | POA: Diagnosis not present

## 2013-07-30 DIAGNOSIS — I482 Chronic atrial fibrillation, unspecified: Secondary | ICD-10-CM

## 2013-07-30 LAB — COMPREHENSIVE METABOLIC PANEL
ALK PHOS: 104 U/L (ref 39–117)
ALT: 16 U/L (ref 0–35)
AST: 29 U/L (ref 0–37)
Albumin: 3.4 g/dL — ABNORMAL LOW (ref 3.5–5.2)
Anion gap: 11 (ref 5–15)
BUN: 23 mg/dL (ref 6–23)
CHLORIDE: 103 meq/L (ref 96–112)
CO2: 28 meq/L (ref 19–32)
Calcium: 9.3 mg/dL (ref 8.4–10.5)
Creatinine, Ser: 0.75 mg/dL (ref 0.50–1.10)
GFR, EST AFRICAN AMERICAN: 84 mL/min — AB (ref >90)
GFR, EST NON AFRICAN AMERICAN: 73 mL/min — AB (ref >90)
GLUCOSE: 90 mg/dL (ref 70–99)
POTASSIUM: 4.7 meq/L (ref 3.7–5.3)
SODIUM: 142 meq/L (ref 137–147)
Total Bilirubin: 0.2 mg/dL — ABNORMAL LOW (ref 0.3–1.2)
Total Protein: 7.2 g/dL (ref 6.0–8.3)

## 2013-07-30 LAB — I-STAT CHEM 8, ED
BUN: 34 mg/dL — AB (ref 6–23)
Calcium, Ion: 1.07 mmol/L — ABNORMAL LOW (ref 1.13–1.30)
Chloride: 105 mEq/L (ref 96–112)
Creatinine, Ser: 0.9 mg/dL (ref 0.50–1.10)
Glucose, Bld: 95 mg/dL (ref 70–99)
HEMATOCRIT: 39 % (ref 36.0–46.0)
Hemoglobin: 13.3 g/dL (ref 12.0–15.0)
Potassium: 5.9 mEq/L — ABNORMAL HIGH (ref 3.7–5.3)
SODIUM: 140 meq/L (ref 137–147)
TCO2: 30 mmol/L (ref 0–100)

## 2013-07-30 LAB — CBC WITH DIFFERENTIAL/PLATELET
BASOS ABS: 0 10*3/uL (ref 0.0–0.1)
Basophils Relative: 1 % (ref 0–1)
Eosinophils Absolute: 0.2 10*3/uL (ref 0.0–0.7)
Eosinophils Relative: 3 % (ref 0–5)
HCT: 35.9 % — ABNORMAL LOW (ref 36.0–46.0)
Hemoglobin: 12.1 g/dL (ref 12.0–15.0)
LYMPHS ABS: 1.8 10*3/uL (ref 0.7–4.0)
LYMPHS PCT: 32 % (ref 12–46)
MCH: 32.6 pg (ref 26.0–34.0)
MCHC: 33.7 g/dL (ref 30.0–36.0)
MCV: 96.8 fL (ref 78.0–100.0)
Monocytes Absolute: 0.5 10*3/uL (ref 0.1–1.0)
Monocytes Relative: 8 % (ref 3–12)
NEUTROS ABS: 3.2 10*3/uL (ref 1.7–7.7)
NEUTROS PCT: 56 % (ref 43–77)
PLATELETS: 209 10*3/uL (ref 150–400)
RBC: 3.71 MIL/uL — AB (ref 3.87–5.11)
RDW: 12.5 % (ref 11.5–15.5)
WBC: 5.6 10*3/uL (ref 4.0–10.5)

## 2013-07-30 LAB — PREPARE RBC (CROSSMATCH)

## 2013-07-30 LAB — HEMOGLOBIN: HEMOGLOBIN: 12.3 g/dL (ref 12.0–15.0)

## 2013-07-30 LAB — MRSA PCR SCREENING: MRSA by PCR: NEGATIVE

## 2013-07-30 LAB — POC OCCULT BLOOD, ED: FECAL OCCULT BLD: POSITIVE — AB

## 2013-07-30 MED ORDER — PANTOPRAZOLE SODIUM 40 MG IV SOLR
40.0000 mg | INTRAVENOUS | Status: DC
  Administered 2013-07-30: 40 mg via INTRAVENOUS
  Filled 2013-07-30 (×2): qty 40

## 2013-07-30 MED ORDER — SODIUM CHLORIDE 0.9 % IV SOLN: Freq: Once | INTRAVENOUS | Status: DC

## 2013-07-30 MED ORDER — OCUSOFT LID SCRUB EX PADS: 1.0000 | MEDICATED_PAD | Freq: Every day | CUTANEOUS | Status: DC

## 2013-07-30 MED ORDER — SODIUM POLYSTYRENE SULFONATE 15 GM/60ML PO SUSP
15.0000 g | Freq: Once | ORAL | Status: AC
  Administered 2013-07-30: 15 g via ORAL
  Filled 2013-07-30: qty 60

## 2013-07-30 MED ORDER — ADULT MULTIVITAMIN W/MINERALS CH
1.0000 | ORAL_TABLET | Freq: Every day | ORAL | Status: DC
  Administered 2013-07-31: 1 via ORAL
  Filled 2013-07-30: qty 1

## 2013-07-30 MED ORDER — ACETAMINOPHEN 650 MG RE SUPP: 650.0000 mg | Freq: Four times a day (QID) | RECTAL | Status: DC | PRN

## 2013-07-30 MED ORDER — ACETAMINOPHEN 325 MG PO TABS: 650.0000 mg | ORAL_TABLET | Freq: Four times a day (QID) | ORAL | Status: DC | PRN

## 2013-07-30 MED ORDER — ONDANSETRON HCL 4 MG/2ML IJ SOLN: 4.0000 mg | Freq: Four times a day (QID) | INTRAMUSCULAR | Status: DC | PRN

## 2013-07-30 MED ORDER — FERROUS FUMARATE 325 (106 FE) MG PO TABS
1.0000 | ORAL_TABLET | Freq: Two times a day (BID) | ORAL | Status: DC
Start: 2013-07-30 — End: 2013-07-31
  Administered 2013-07-31: 106 mg via ORAL
  Filled 2013-07-30 (×3): qty 1

## 2013-07-30 MED ORDER — DOCUSATE SODIUM 100 MG PO CAPS
100.0000 mg | ORAL_CAPSULE | Freq: Every day | ORAL | Status: DC
  Administered 2013-07-31: 100 mg via ORAL
  Filled 2013-07-30: qty 1

## 2013-07-30 MED ORDER — HYDROCODONE-ACETAMINOPHEN 5-325 MG PO TABS: 1.0000 | ORAL_TABLET | ORAL | Status: DC | PRN

## 2013-07-30 MED ORDER — DILTIAZEM HCL 60 MG PO TABS
60.0000 mg | ORAL_TABLET | Freq: Four times a day (QID) | ORAL | Status: DC
  Administered 2013-07-30 – 2013-07-31 (×2): 60 mg via ORAL
  Filled 2013-07-30 (×5): qty 1

## 2013-07-30 MED ORDER — METOPROLOL TARTRATE 12.5 MG HALF TABLET
12.5000 mg | ORAL_TABLET | Freq: Two times a day (BID) | ORAL | Status: DC
  Administered 2013-07-31: 12.5 mg via ORAL
  Filled 2013-07-30 (×3): qty 1

## 2013-07-30 MED ORDER — SODIUM CHLORIDE 0.9 % IJ SOLN
3.0000 mL | Freq: Two times a day (BID) | INTRAMUSCULAR | Status: DC
  Administered 2013-07-30: 3 mL via INTRAVENOUS

## 2013-07-30 MED ORDER — SODIUM CHLORIDE 0.9 % IV SOLN
INTRAVENOUS | Status: DC
  Administered 2013-07-30: 21:00:00 via INTRAVENOUS

## 2013-07-30 MED ORDER — DIGOXIN 0.0625 MG HALF TABLET
0.0625 mg | ORAL_TABLET | Freq: Every day | ORAL | Status: DC
  Administered 2013-07-31: 0.0625 mg via ORAL
  Filled 2013-07-30: qty 1

## 2013-07-30 MED ORDER — ONDANSETRON HCL 4 MG PO TABS: 4.0000 mg | ORAL_TABLET | Freq: Four times a day (QID) | ORAL | Status: DC | PRN

## 2013-07-30 NOTE — ED Notes (Signed)
Per EMS-pt from [place] with c/o of fall. States that she was sitting in chair and fell forward. No obvious head injury.

## 2013-07-30 NOTE — ED Notes (Signed)
Bed: YB74 Expected date:  Expected time:  Means of arrival:  Comments: Fall from nursing home

## 2013-07-30 NOTE — ED Provider Notes (Signed)
CSN: [id number]     Arrival date & time 07/30/13  1613 History   First MD Initiated Contact with Patient 07/30/13 1626     Chief Complaint  Patient presents with  . Fall     (Consider location/radiation/quality/duration/timing/severity/associated sxs/prior Treatment) HPI 80 year old female with history of dementia with history of atrial fibrillation not on anticoagulation with history of multiple falls had a witnessed fall out of a chair hitting her today with no reported loss of consciousness or reported change from her baseline mental status; patient is nonverbal awake moving all 4 extremities spontaneously unable to follow commands; apparently this is her baseline mental status; there is no obvious injury prior to arrival; ED nurse noted the patient had black stool when she was cleaning patient upon arrival. Patient is unable to give any history herself due to her severe dementia. Past Medical History  Diagnosis Date  . Alzheimer disease   . Atrial fibrillation   . Gastro - esophageal reflux disease   . Ischemic colitis, enteritis, or enterocolitis   . Dementia   . E-coli UTI   . Hypokalemia   . Nasal septal hematoma   . Shoulder fracture, left   . Compression fracture of T11 vertebra   . MGUS (monoclonal gammopathy of unknown significance)    Past Surgical History  Procedure Laterality Date  . Unknown    . Esophagogastroduodenoscopy  11/10/2010    Procedure: ESOPHAGOGASTRODUODENOSCOPY (EGD);  Surgeon: Beryle Beams;  Location: [facility];  Service: Endoscopy;  Laterality: N/A;  . Flexible sigmoidoscopy  11/10/2010    Procedure: FLEXIBLE SIGMOIDOSCOPY;  Surgeon: Beryle Beams;  Location: [facility];  Service: Gastroenterology;  Laterality: N/A;   Family History  Problem Relation Age of Onset  . Diabetes Mother    History  Substance Use Topics  . Smoking status: Never Smoker   . Smokeless tobacco: Never Used  . Alcohol Use: No   OB History   Grav Para Term Preterm  Abortions TAB SAB Ect Mult Living                 Review of Systems  Unable to perform ROS: Dementia      Allergies  Review of patient's allergies indicates no known allergies.  Home Medications   Prior to Admission medications   Medication Sig Start Date End Date Taking? Authorizing Provider  acetaminophen (MAPAP) 325 MG tablet Take 650 mg by mouth every 6 (six) hours as needed for pain.    Yes Historical Provider, MD  aspirin 81 MG chewable tablet Chew 81 mg by mouth daily.   Yes Historical Provider, MD  digoxin (LANOXIN) 0.125 MG tablet Take 0.0625 mg by mouth daily. Pt takes 1/2 tab =0.0625 mg   Yes Historical Provider, MD  diltiazem (CARDIZEM) 60 MG tablet Take 60 mg by mouth 4 (four) times daily.   Yes Historical Provider, MD  docusate (COLACE) 50 MG/5ML liquid Take 100 mg by mouth daily.   Yes Historical Provider, MD  Eyelid Cleansers (OCUSOFT LID SCRUB) PADS Apply 1 each topically daily.   Yes Historical Provider, MD  ferrous fumarate (HEMOCYTE - 106 MG FE) 325 (106 FE) MG TABS tablet Take 1 tablet by mouth 2 (two) times daily.   Yes Historical Provider, MD  metoprolol tartrate (LOPRESSOR) 25 MG tablet Take 12.5 mg by mouth 2 (two) times daily. Pt receives 1/2 tab = 12.5mg    Yes Historical Provider, MD  Multiple Vitamin (MULTIVITAMIN WITH MINERALS) TABS tablet Take 1 tablet by mouth daily.  Yes Historical Provider, MD   BP 142/68  Pulse 98  Temp(Src) 98.3 F (36.8 C) (Axillary)  Resp 18  SpO2 95% Physical Exam  Nursing note and vitals reviewed. Constitutional:  Awake, alert, nontoxic appearance.  HENT:  Minor contusion left upper forehead barely noticeable slightly reddened possibly from fall just prior to arrival  Eyes: Right eye exhibits no discharge. Left eye exhibits no discharge.  Neck: Neck supple.  No apparent C-spine tenderness  Cardiovascular: Normal rate.   No murmur heard. Irregular rhythm  Pulmonary/Chest: Effort normal and breath sounds normal. No  respiratory distress. She has no wheezes. She has no rales. She exhibits no tenderness.  Abdominal: Soft. Bowel sounds are normal. She exhibits no distension. There is no tenderness. There is no rebound and no guarding.  Musculoskeletal: She exhibits no edema and no tenderness.  Baseline ROM, no obvious new focal weakness. Patient moves both arms well with minimal active movement of her legs however passive movement of her legs including hips knees ankles feet and pelvis appear nontender. Arms appear nontender as well.  Neurological: She is alert.  Mental status and motor strength appears baseline for patient and situation.  Skin: No rash noted.  Psychiatric: She has a normal mood and affect.    ED Course  Procedures (including critical care time) Interface didn't result occult stool which was positive; possible hemolyzed iStat; CBC/CMP/ECG ordered. Anticipate Obs for GI bleed with fall and normal Hgb.[place] Reviewed  CBC WITH DIFFERENTIAL - Abnormal; Notable for the following:    RBC 3.71 (*)    HCT 35.9 (*)    All other components within normal limits  COMPREHENSIVE METABOLIC PANEL - Abnormal; Notable for the following:    Albumin 3.4 (*)    Total Bilirubin 0.2 (*)    GFR calc non Af Amer 73 (*)    GFR calc Af Amer 84 (*)    All other components within normal limits  BASIC METABOLIC PANEL - Abnormal; Notable for the following:    GFR calc non Af Amer 76 (*)    GFR calc Af Amer 88 (*)    All other components within normal limits  CBC - Abnormal; Notable for the following:    RBC 3.72 (*)    HCT 35.5 (*)    All other components within normal limits  POC OCCULT BLOOD, ED - Abnormal; Notable for the following:    Fecal Occult Bld POSITIVE (*)    All other components within normal limits  I-STAT CHEM 8, ED - Abnormal; Notable for the following:    Potassium 5.9 (*)    BUN 34 (*)    Calcium, Ion 1.07 (*)    All other components within normal limits  MRSA PCR  SCREENING  HEMOGLOBIN  HEMOGLOBIN  TYPE AND SCREEN  PREPARE RBC (CROSSMATCH)    Imaging Review Ct Head Wo Contrast  07/30/2013   CLINICAL DATA:  Pain post trauma  EXAM: CT HEAD WITHOUT CONTRAST  CT CERVICAL SPINE WITHOUT CONTRAST  TECHNIQUE: Multidetector CT imaging of the head and cervical spine was performed following the standard protocol without intravenous contrast. Multiplanar CT image reconstructions of the cervical spine were also generated.  COMPARISON:  December 01, 2011  FINDINGS: CT HEAD FINDINGS  Moderate diffuse atrophy is stable. There is no mass, hemorrhage, extra-axial fluid collection, or midline shift. Small vessel disease is noted throughout the centra semiovale bilaterally. There is small vessel disease in both internal and external capsules, stable. There is  no new gray-white compartment lesion. No acute infarct apparent. Bony calvarium appears intact. The mastoid air cells are clear.  CT CERVICAL SPINE FINDINGS  There is no fracture or spondylolisthesis. Prevertebral soft tissues and predental space regions are normal. There is moderately severe disc space narrowing at C5-6 and C6-7. There is facet osteoarthritic change at multiple levels. No disc extrusion or stenosis. There is calcification in both carotid arteries.  IMPRESSION: CT head: Atrophy with small vessel disease, stable. No intracranial mass, hemorrhage, or acute appearing infarct.  CT cervical spine: Osteoarthritic changes several levels. No fracture or spondylolisthesis. Calcification in both carotid arteries.   Electronically Signed   By: Lowella Grip M.D.   On: 07/30/2013 17:14   Ct Cervical Spine Wo Contrast  07/30/2013   CLINICAL DATA:  Pain post trauma  EXAM: CT HEAD WITHOUT CONTRAST  CT CERVICAL SPINE WITHOUT CONTRAST  TECHNIQUE: Multidetector CT imaging of the head and cervical spine was performed following the standard protocol without intravenous contrast. Multiplanar CT image reconstructions of the  cervical spine were also generated.  COMPARISON:  December 01, 2011  FINDINGS: CT HEAD FINDINGS  Moderate diffuse atrophy is stable. There is no mass, hemorrhage, extra-axial fluid collection, or midline shift. Small vessel disease is noted throughout the centra semiovale bilaterally. There is small vessel disease in both internal and external capsules, stable. There is no new gray-white compartment lesion. No acute infarct apparent. Bony calvarium appears intact. The mastoid air cells are clear.  CT CERVICAL SPINE FINDINGS  There is no fracture or spondylolisthesis. Prevertebral soft tissues and predental space regions are normal. There is moderately severe disc space narrowing at C5-6 and C6-7. There is facet osteoarthritic change at multiple levels. No disc extrusion or stenosis. There is calcification in both carotid arteries.  IMPRESSION: CT head: Atrophy with small vessel disease, stable. No intracranial mass, hemorrhage, or acute appearing infarct.  CT cervical spine: Osteoarthritic changes several levels. No fracture or spondylolisthesis. Calcification in both carotid arteries.   Electronically Signed   By: Lowella Grip M.D.   On: 07/30/2013 17:14     EKG Interpretation   Date/Time:  Thursday July 30 2013 18:54:10 EDT Ventricular Rate:  97 PR Interval:    QRS Duration: 89 QT Interval:  395 QTC Calculation: 502 R Axis:   83 Text Interpretation:  Atrial fibrillation Borderline right axis deviation  Borderline T abnormalities, inferior leads Baseline wander in lead(s) V2  V5 No significant change since last tracing Confirmed by [facility]  MD, Jenny Reichmann  [phone]) on 07/30/2013 7:02:06 PM      MDM   Final diagnoses:  Gastrointestinal hemorrhage, unspecified gastritis, unspecified gastrointestinal hemorrhage type  Alzheimer's dementia  Chronic atrial fibrillation  History of fall  Hyperkalemia    The patient appears reasonably stabilized for admission considering the current resources,  flow, and capabilities available in the ED at this time, and I doubt any other [facility] requiring further screening and/or treatment in the ED prior to admission.    Babette Relic, MD 07/31/13 2125

## 2013-07-30 NOTE — H&P (Signed)
[facility]                                                                                    Patient Demographics  Stacey Huang, is a 80 y.o. female  CSN: [id number]  MRN: [id number]  DOB - 03/14/1922  Admit Date - 07/30/2013  Outpatient Primary MD for the patient is Reymundo Poll, MD   With History of -  Past Medical History  Diagnosis Date  . Alzheimer disease   . Atrial fibrillation   . Gastro - esophageal reflux disease   . Ischemic colitis, enteritis, or enterocolitis   . Dementia   . E-coli UTI   . Hypokalemia   . Nasal septal hematoma   . Shoulder fracture, left   . Compression fracture of T11 vertebra   . MGUS (monoclonal gammopathy of unknown significance)       Past Surgical History  Procedure Laterality Date  . Unknown    . Esophagogastroduodenoscopy  11/10/2010    Procedure: ESOPHAGOGASTRODUODENOSCOPY (EGD);  Surgeon: Beryle Beams;  Location: [facility];  Service: Endoscopy;  Laterality: N/A;  . Flexible sigmoidoscopy  11/10/2010    Procedure: FLEXIBLE SIGMOIDOSCOPY;  Surgeon: Beryle Beams;  Location: [facility];  Service: Gastroenterology;  Laterality: N/A;    in for   Chief Complaint  Patient presents with  . Fall     HPI  Stacey Huang  is a 80 y.o. female, with history of ischemic colitis who had a witnessed fall in the nursing home, with no change in mental status. The patient has a history of advanced dementia and has history of chronic atrial fibrillation on baby aspirin daily. Patient has history of multiple falls . Patient is nonverbal and cannot provide any history. In the emergency room, and during examination her stool was noted to be dark, tarry and guaiac positive. Her hemoglobin was normal and her blood pressure was 106/74 on admission. I was called to admit for GI bleed.    Review of Systems    Unable to obtain due to patient's condition  Social History History  Substance Use Topics  . Smoking status:  Never Smoker   . Smokeless tobacco: Never Used  . Alcohol Use: No     Family History Family History  Problem Relation Age of Onset  . Diabetes Mother      Prior to Admission medications   Medication Sig Start Date End Date Taking? Authorizing Provider  acetaminophen (MAPAP) 325 MG tablet Take 650 mg by mouth every 6 (six) hours as needed for pain.    Yes Historical Provider, MD  aspirin 81 MG chewable tablet Chew 81 mg by mouth daily.   Yes Historical Provider, MD  digoxin (LANOXIN) 0.125 MG tablet Take 0.0625 mg by mouth daily. Pt takes 1/2 tab =0.0625 mg   Yes Historical Provider, MD  diltiazem (CARDIZEM) 60 MG tablet Take 60 mg by mouth 4 (four) times daily.   Yes Historical Provider, MD  docusate (COLACE) 50 MG/5ML liquid Take 100 mg by mouth daily.   Yes Historical Provider, MD  Eyelid Cleansers (OCUSOFT LID SCRUB) PADS Apply 1 each topically daily.   Yes Historical Provider,  MD  ferrous fumarate (HEMOCYTE - 106 MG FE) 325 (106 FE) MG TABS tablet Take 1 tablet by mouth 2 (two) times daily.   Yes Historical Provider, MD  metoprolol tartrate (LOPRESSOR) 25 MG tablet Take 12.5 mg by mouth 2 (two) times daily. Pt receives 1/2 tab = 12.5mg    Yes Historical Provider, MD  Multiple Vitamin (MULTIVITAMIN WITH MINERALS) TABS tablet Take 1 tablet by mouth daily.   Yes Historical Provider, MD    No Known Allergies  Physical Exam  Vitals  Blood pressure 106/74, pulse 95, temperature 96.9 F (36.1 C), temperature source Axillary, resp. rate 15, SpO2 100.00%.   1. General elderly white American female, slightly agitated  2. confused/encephalopathic.  3. No gross F.N deficits,  4. Ears and Eyes appear Normal, Conjunctivae clear, PERRLA. Moist Oral Mucosa.  5. stiff Neck, No JVD, No cervical lymphadenopathy appriciated, No Carotid Bruits.  6. Symmetrical Chest wall movement, poor inspiratory effort.  7. RRR, No Gallops, Rubs or Murmurs, No Parasternal Heave.  8. Positive Bowel  Sounds, Abdomen Soft, Non tender, No organomegaly appriciated,No rebound -guarding or rigidity.  9.  No Cyanosis, Normal Skin Turgor, No Skin Rash or Bruise.  10. Good muscle tone,      Data Review  CBC  Recent Labs Lab 07/30/13 1729 07/30/13 1853  WBC  --  5.6  HGB 13.3 12.1  HCT 39.0 35.9*  PLT  --  209  MCV  --  96.8  MCH  --  32.6  MCHC  --  33.7  RDW  --  12.5  LYMPHSABS  --  1.8  MONOABS  --  0.5  EOSABS  --  0.2  BASOSABS  --  0.0   ------------------------------------------------------------------------------------------------------------------  Chemistries   Recent Labs Lab 07/30/13 1729  NA 140  K 5.9*  CL 105  GLUCOSE 95  BUN 34*  CREATININE 0.90   ------------------------------------------------------------------------------------------------------------------ CrCl is unknown because both a height and weight (above a minimum accepted value) are required for this calculation. ------------------------------------------------------------------------------------------------------------------ No results found for this basename: TSH, T4TOTAL, FREET3, T3FREE, THYROIDAB,  in the last 72 hours   Coagulation profile No results found for this basename: INR, PROTIME,  in the last 168 hours ------------------------------------------------------------------------------------------------------------------- No results found for this basename: DDIMER,  in the last 72 hours -------------------------------------------------------------------------------------------------------------------  Cardiac Enzymes No results found for this basename: CK, CKMB, TROPONINI, MYOGLOBIN,  in the last 168 hours ------------------------------------------------------------------------------------------------------------------ No components found with this basename: POCBNP,     ---------------------------------------------------------------------------------------------------------------  Urinalysis    Component Value Date/Time   COLORURINE YELLOW 12/01/2011 1130   APPEARANCEUR CLEAR 12/01/2011 1130   LABSPEC 1.015 12/01/2011 1130   PHURINE 7.0 12/01/2011 1130   GLUCOSEU NEGATIVE 12/01/2011 1130   HGBUR NEGATIVE 12/01/2011 1130   BILIRUBINUR NEGATIVE 12/01/2011 1130   KETONESUR NEGATIVE 12/01/2011 1130   PROTEINUR NEGATIVE 12/01/2011 1130   UROBILINOGEN 0.2 12/01/2011 1130   NITRITE NEGATIVE 12/01/2011 [place] 12/01/2011 1130    ----------------------------------------------------------------------------------------------------------------   Imaging results:   Ct Head Wo Contrast  07/30/2013   CLINICAL DATA:  Pain post trauma  EXAM: CT HEAD WITHOUT CONTRAST  CT CERVICAL SPINE WITHOUT CONTRAST  TECHNIQUE: Multidetector CT imaging of the head and cervical spine was performed following the standard protocol without intravenous contrast. Multiplanar CT image reconstructions of the cervical spine were also generated.  COMPARISON:  December 01, 2011  FINDINGS: CT HEAD FINDINGS  Moderate diffuse atrophy is stable. There is no mass, hemorrhage, extra-axial fluid collection,  or midline shift. Small vessel disease is noted throughout the centra semiovale bilaterally. There is small vessel disease in both internal and external capsules, stable. There is no new gray-white compartment lesion. No acute infarct apparent. Bony calvarium appears intact. The mastoid air cells are clear.  CT CERVICAL SPINE FINDINGS  There is no fracture or spondylolisthesis. Prevertebral soft tissues and predental space regions are normal. There is moderately severe disc space narrowing at C5-6 and C6-7. There is facet osteoarthritic change at multiple levels. No disc extrusion or stenosis. There is calcification in both carotid arteries.  IMPRESSION: CT head: Atrophy with  small vessel disease, stable. No intracranial mass, hemorrhage, or acute appearing infarct.  CT cervical spine: Osteoarthritic changes several levels. No fracture or spondylolisthesis. Calcification in both carotid arteries.   Electronically Signed   By: Lowella Grip M.D.   On: 07/30/2013 17:14   Ct Cervical Spine Wo Contrast  07/30/2013   CLINICAL DATA:  Pain post trauma  EXAM: CT HEAD WITHOUT CONTRAST  CT CERVICAL SPINE WITHOUT CONTRAST  TECHNIQUE: Multidetector CT imaging of the head and cervical spine was performed following the standard protocol without intravenous contrast. Multiplanar CT image reconstructions of the cervical spine were also generated.  COMPARISON:  December 01, 2011  FINDINGS: CT HEAD FINDINGS  Moderate diffuse atrophy is stable. There is no mass, hemorrhage, extra-axial fluid collection, or midline shift. Small vessel disease is noted throughout the centra semiovale bilaterally. There is small vessel disease in both internal and external capsules, stable. There is no new gray-white compartment lesion. No acute infarct apparent. Bony calvarium appears intact. The mastoid air cells are clear.  CT CERVICAL SPINE FINDINGS  There is no fracture or spondylolisthesis. Prevertebral soft tissues and predental space regions are normal. There is moderately severe disc space narrowing at C5-6 and C6-7. There is facet osteoarthritic change at multiple levels. No disc extrusion or stenosis. There is calcification in both carotid arteries.  IMPRESSION: CT head: Atrophy with small vessel disease, stable. No intracranial mass, hemorrhage, or acute appearing infarct.  CT cervical spine: Osteoarthritic changes several levels. No fracture or spondylolisthesis. Calcification in both carotid arteries.   Electronically Signed   By: Lowella Grip M.D.   On: 07/30/2013 17:14    My personal review of EKG: Atrial fibrillation with right axis deviation, no change from previous    Assessment &  Plan  1. guaiac-positive stools. Patient is on aspirin.    Normal hemoglobin and blood pressure     Slow hydration    IV Protonix    Workup is limited by DO NOT RESUSCITATE and clinical status of  Patient    Transfuse when necessary 2. Advanced dementia 3. History of atrial fibrillation on aspirin only due to multiple falls. 4. Hyperkalemia     Kayexalate     Take potassium level in a.m.     No EKG changes    DVT Prophylaxis  SCDs  AM Labs Ordered, also please review Full Orders  Code Status full  Disposition Plan: Home  Time spent in minutes : 32 minutes  Condition GUARDED   @SIGNATURE @

## 2013-07-31 DIAGNOSIS — K922 Gastrointestinal hemorrhage, unspecified: Secondary | ICD-10-CM | POA: Diagnosis not present

## 2013-07-31 DIAGNOSIS — I482 Chronic atrial fibrillation, unspecified: Secondary | ICD-10-CM

## 2013-07-31 DIAGNOSIS — E875 Hyperkalemia: Secondary | ICD-10-CM | POA: Diagnosis present

## 2013-07-31 DIAGNOSIS — Z9181 History of falling: Secondary | ICD-10-CM

## 2013-07-31 LAB — BASIC METABOLIC PANEL
ANION GAP: 11 (ref 5–15)
BUN: 18 mg/dL (ref 6–23)
CO2: 26 mEq/L (ref 19–32)
CREATININE: 0.66 mg/dL (ref 0.50–1.10)
Calcium: 8.8 mg/dL (ref 8.4–10.5)
Chloride: 101 mEq/L (ref 96–112)
GFR calc non Af Amer: 76 mL/min — ABNORMAL LOW (ref >90)
GFR, EST AFRICAN AMERICAN: 88 mL/min — AB (ref >90)
Glucose, Bld: 98 mg/dL (ref 70–99)
POTASSIUM: 3.8 meq/L (ref 3.7–5.3)
Sodium: 138 mEq/L (ref 137–147)

## 2013-07-31 LAB — CBC
HCT: 35.5 % — ABNORMAL LOW (ref 36.0–46.0)
Hemoglobin: 12.1 g/dL (ref 12.0–15.0)
MCH: 32.5 pg (ref 26.0–34.0)
MCHC: 34.1 g/dL (ref 30.0–36.0)
MCV: 95.4 fL (ref 78.0–100.0)
PLATELETS: 212 10*3/uL (ref 150–400)
RBC: 3.72 MIL/uL — AB (ref 3.87–5.11)
RDW: 12.5 % (ref 11.5–15.5)
WBC: 6.4 10*3/uL (ref 4.0–10.5)

## 2013-07-31 LAB — HEMOGLOBIN: HEMOGLOBIN: 12.6 g/dL (ref 12.0–15.0)

## 2013-07-31 MED ORDER — METOPROLOL TARTRATE 1 MG/ML IV SOLN
2.5000 mg | Freq: Once | INTRAVENOUS | Status: AC
  Administered 2013-07-31: 2.5 mg via INTRAVENOUS
  Filled 2013-07-31: qty 5

## 2013-07-31 MED ORDER — CETYLPYRIDINIUM CHLORIDE 0.05 % MT LIQD
7.0000 mL | Freq: Two times a day (BID) | OROMUCOSAL | Status: DC
  Administered 2013-07-31: 7 mL via OROMUCOSAL

## 2013-07-31 NOTE — Progress Notes (Signed)
Utilization Review Completed.Kameren Baade T7/31/2015  

## 2013-07-31 NOTE — Progress Notes (Signed)
Patient discharge home with son and daughter In-law, alert and disoriented, discharge instruction given to Son, patient's son verbalize understanding of orders given, patient in stable condition at this time

## 2013-07-31 NOTE — Progress Notes (Signed)
Clinical Social Work Department BRIEF PSYCHOSOCIAL ASSESSMENT 07/31/2013  Patient:  Stacey Huang, Stacey Huang     Account Number:  [id number]     Admit date:  07/30/2013  Clinical Social Worker:  Earlie Server  Date/Time:  07/31/2013 10:00 AM  Referred by:  Physician  Date Referred:  07/31/2013 Referred for  ALF Placement   Other Referral:   Interview type:  Family Other interview type:    PSYCHOSOCIAL DATA Living Status:  FACILITY Admitted from facility:  [facility] Level of care:  Assisted Living Primary support name:  Ronnie Primary support relationship to patient:  CHILD, ADULT Degree of support available:   Strong    CURRENT CONCERNS Current Concerns  Post-Acute Placement   Other Concerns:    SOCIAL WORK ASSESSMENT / PLAN CSW received referral in order to assist with DC planning. CSW reviewed chart and met with patient, son, and dtr-in-law at bedside. CSW introduced myself and explained role.    Patient did not participate in assessment and son reports that due to dementia, he handles her care. Son reports that he wants patient to return to [facility] because patient is familiar with setting and receives good care. Son reports if MD clears patient to DC today then he will provide transportation back to ALF.    CSW completed FL2 and spoke with Lav[facility] at ALF who is agreeable to accept patient back. CSW will continue to follow.   Assessment/plan status:  Psychosocial Support/Ongoing Assessment of Needs Other assessment/ plan:   Information/referral to community resources:   Will return to ALF    PATIENT'S/FAMILY'S RESPONSE TO PLAN OF CARE: Patient unable to fully participate in assessment. Son thanked CSW for visit and reports he is familiar with hospitalization and DC process. Son reports that he is hopeful for DC today so that patient can get back to where she feels comfortable. Son is happy that patient is not hurt and sleeping well this morning. Son  reports he and wife care for patient and support each other. Son has CSW contact information if further needs arise.       [place], [place] [phone]

## 2013-07-31 NOTE — Progress Notes (Signed)
Clinical Social Work  CSW faxed DC summary and FL2 to [facility] who is agreeable to accept patient back today. CSW prepared DC packet with FL2, DNR form and DC summary included. CSW spoke with son who will come to hospital to transport patient back to ALF at 1pm. CSW alert RN of plans and RN will ensure patient takes DC packet to her with ALF.  CSW is signing off but available if needed.  [place], [place] [phone]

## 2013-07-31 NOTE — Discharge Summary (Signed)
Physician Discharge Summary  Stacey Huang XKG:[id number] DOB: 01-13-1922 DOA: 07/30/2013  PCP: Reymundo Poll, MD  Admit date: 07/30/2013 Discharge date: 07/31/2013  Time spent: 25 minutes  Recommendations for Outpatient Follow-up:  1. D/c back to assist living.  2. Monitor h&h in 1 week  Discharge Diagnoses:  Principal Problem:   GI bleed  Active Problems:   Gastroesophageal reflux disease   Dementia   Alzheimer's dementia   Chronic atrial fibrillation   Hyperkalemia H/x of ischemic colitis   Discharge Condition: fair  Diet recommendation: clear liquids  Code status: DNR   History of present illness:  Please refer to admission H&P for details, but in brief, 80 y.o. female, with history of ischemic colitis who had a witnessed fall in the nursing home, with no change in mental status. The patient has a history of advanced dementia and has history of chronic atrial fibrillation on baby aspirin daily. Patient has history of multiple falls . Patient was nonverbal and could not  provide any history. In the emergency room, and during examination her stool was noted to be dark, tarry and guaiac positive. Her hemoglobin was normal and her blood pressure was 106/74 on admission. hospitalist admission requested for observation.    Hospital Course:  GI bleed with guiac positive stool Hb stable at baseline with stable vitals Patient has hx of ischemic colitis seen on flex sigmoidoscopy in 2012 and was admitted to the hospital or the same in 2013 Spoke with her GI Dr hung who agrees with no need for intervention and ok with discharge her back to assist living. Needs hb monitored as outpt and transfuse as needed. Discussed with son at bedside who agrees with plan. -continue iron supplements.  Fall - mechanical. Needs 2 person assist to ambulate. Head and cervical CT on admission unremarkable.  Hyperkalemia Noted on admission. Kayexalate given and subsequent potassium normal. No EKG  changes.  Afib Not on anticoagulation due to multiple falls and hx of GI bleed. On baby aspirin which will be continued. Continue digoxin Rate control with cardizem and metoprolol  Advanced dementia Not on meds   Diet: clear liquids  Family communication: son at bedside   Procedures:  none  Consultations:  None ( spoke with Dr Benson Norway on the phone)  Discharge Exam: Filed Vitals:   07/31/13 0614  BP: 142/68  Pulse: 98  Temp: 98.3 F (36.8 C)  Resp:     General: elderly female lying in bed sleepy, aphasic HEENT: no pallor, moist mucosa Chest: clear b/l  CVS: S1 &S2 irregularly iregular Abd: soft, NT, ND, BS+ CNS: sleepy but arousable. Non verbal  Discharge Instructions You were cared for by a hospitalist during your hospital stay. If you have any questions about your discharge medications or the care you received while you were in the hospital after you are discharged, you can call the unit and asked to speak with the hospitalist on call if the hospitalist that took care of you is not available. Once you are discharged, your primary care physician will handle any further medical issues. Please note that NO REFILLS for any discharge medications will be authorized once you are discharged, as it is imperative that you return to your primary care physician (or establish a relationship with a primary care physician if you do not have one) for your aftercare needs so that they can reassess your need for medications and monitor your lab values.     Medication List  aspirin 81 MG chewable tablet  Chew 81 mg by mouth daily.     digoxin 0.125 MG tablet  Commonly known as:  LANOXIN  Take 0.0625 mg by mouth daily. Pt takes 1/2 tab =0.0625 mg     diltiazem 60 MG tablet  Commonly known as:  CARDIZEM  Take 60 mg by mouth 4 (four) times daily.     docusate 50 MG/5ML liquid  Commonly known as:  COLACE  Take 100 mg by mouth daily.     ferrous fumarate 325 (106 FE) MG  Tabs tablet  Commonly known as:  HEMOCYTE - 106 mg FE  Take 1 tablet by mouth 2 (two) times daily.     MAPAP 325 MG tablet  Generic drug:  acetaminophen  Take 650 mg by mouth every 6 (six) hours as needed for pain.     metoprolol tartrate 25 MG tablet  Commonly known as:  LOPRESSOR  Take 12.5 mg by mouth 2 (two) times daily. Pt receives 1/2 tab = 12.5mg      multivitamin with minerals Tabs tablet  Take 1 tablet by mouth daily.     OCUSOFT LID SCRUB Pads  Apply 1 each topically daily.       No Known Allergies     Follow-up Information   Follow up with Reymundo Poll, MD In 1 week. (follow hb in 1 week)    Specialty:  Family Medicine   Contact information:   [address] [place] [address] [phone]        The results of significant diagnostics from this hospitalization (including imaging, microbiology, ancillary and laboratory) are listed below for reference.    Significant Diagnostic Studies: Ct Head Wo Contrast  07/30/2013   CLINICAL DATA:  Pain post trauma  EXAM: CT HEAD WITHOUT CONTRAST  CT CERVICAL SPINE WITHOUT CONTRAST  TECHNIQUE: Multidetector CT imaging of the head and cervical spine was performed following the standard protocol without intravenous contrast. Multiplanar CT image reconstructions of the cervical spine were also generated.  COMPARISON:  December 01, 2011  FINDINGS: CT HEAD FINDINGS  Moderate diffuse atrophy is stable. There is no mass, hemorrhage, extra-axial fluid collection, or midline shift. Small vessel disease is noted throughout the centra semiovale bilaterally. There is small vessel disease in both internal and external capsules, stable. There is no new gray-white compartment lesion. No acute infarct apparent. Bony calvarium appears intact. The mastoid air cells are clear.  CT CERVICAL SPINE FINDINGS  There is no fracture or spondylolisthesis. Prevertebral soft tissues and predental space regions are normal. There is moderately  severe disc space narrowing at C5-6 and C6-7. There is facet osteoarthritic change at multiple levels. No disc extrusion or stenosis. There is calcification in both carotid arteries.  IMPRESSION: CT head: Atrophy with small vessel disease, stable. No intracranial mass, hemorrhage, or acute appearing infarct.  CT cervical spine: Osteoarthritic changes several levels. No fracture or spondylolisthesis. Calcification in both carotid arteries.   Electronically Signed   By: Lowella Grip M.D.   On: 07/30/2013 17:14   Ct Cervical Spine Wo Contrast  07/30/2013   CLINICAL DATA:  Pain post trauma  EXAM: CT HEAD WITHOUT CONTRAST  CT CERVICAL SPINE WITHOUT CONTRAST  TECHNIQUE: Multidetector CT imaging of the head and cervical spine was performed following the standard protocol without intravenous contrast. Multiplanar CT image reconstructions of the cervical spine were also generated.  COMPARISON:  December 01, 2011  FINDINGS: CT HEAD FINDINGS  Moderate diffuse atrophy is stable.  There is no mass, hemorrhage, extra-axial fluid collection, or midline shift. Small vessel disease is noted throughout the centra semiovale bilaterally. There is small vessel disease in both internal and external capsules, stable. There is no new gray-white compartment lesion. No acute infarct apparent. Bony calvarium appears intact. The mastoid air cells are clear.  CT CERVICAL SPINE FINDINGS  There is no fracture or spondylolisthesis. Prevertebral soft tissues and predental space regions are normal. There is moderately severe disc space narrowing at C5-6 and C6-7. There is facet osteoarthritic change at multiple levels. No disc extrusion or stenosis. There is calcification in both carotid arteries.  IMPRESSION: CT head: Atrophy with small vessel disease, stable. No intracranial mass, hemorrhage, or acute appearing infarct.  CT cervical spine: Osteoarthritic changes several levels. No fracture or spondylolisthesis. Calcification in both carotid  arteries.   Electronically Signed   By: Lowella Grip M.D.   On: 07/30/2013 17:14    Microbiology: Recent Results (from the past 240 hour(s))  MRSA PCR SCREENING     Status: None   Collection Time    07/30/13  8:18 PM      Result Value Ref Range Status   MRSA by PCR NEGATIVE  NEGATIVE Final   Comment:            The GeneXpert MRSA Assay (FDA     approved for NASAL specimens     only), is one component of a     comprehensive MRSA colonization     surveillance program. It is not     intended to diagnose MRSA     infection nor to guide or     monitor treatment for     MRSA infections.     Labs: Basic Metabolic Panel:  Recent Labs Lab 07/30/13 1729 07/30/13 1853 07/31/13 0118  NA 140 142 138  K 5.9* 4.7 3.8  CL 105 103 101  CO2  --  28 26  GLUCOSE 95 90 98  BUN 34* 23 18  CREATININE 0.90 0.75 0.66  CALCIUM  --  9.3 8.8   Liver Function Tests:  Recent Labs Lab 07/30/13 1853  AST 29  ALT 16  ALKPHOS 104  BILITOT 0.2*  PROT 7.2  ALBUMIN 3.4*   No results found for this basename: LIPASE, AMYLASE,  in the last 168 hours No results found for this basename: AMMONIA,  in the last 168 hours CBC:  Recent Labs Lab 07/30/13 1729 07/30/13 1853 07/30/13 2048 07/31/13 0118 07/31/13 0825  WBC  --  5.6  --  6.4  --   NEUTROABS  --  3.2  --   --   --   HGB 13.3 12.1 12.3 12.1 12.6  HCT 39.0 35.9*  --  35.5*  --   MCV  --  96.8  --  95.4  --   PLT  --  209  --  212  --    Cardiac Enzymes: No results found for this basename: CKTOTAL, CKMB, CKMBINDEX, TROPONINI,  in the last 168 hours BNP: BNP (last 3 results) No results found for this basename: PROBNP,  in the last 8760 hours CBG: No results found for this basename: GLUCAP,  in the last 168 hours     Signed:  Mccall Lomax, [place]  [facility] 07/31/2013, 10:35 AM

## 2013-08-03 LAB — TYPE AND SCREEN
ABO/RH(D): A POS
Antibody Screen: NEGATIVE
UNIT DIVISION: 0
Unit division: 0

## 2015-08-26 ENCOUNTER — Emergency Department (HOSPITAL_COMMUNITY)
Admission: EM | Admit: 2015-08-26 | Discharge: 2015-08-26 | Disposition: A | Discharge status: Home / Self Care | Payer: Medicare Other | Attending: Emergency Medicine | Admitting: Emergency Medicine

## 2015-08-26 ENCOUNTER — Encounter (HOSPITAL_COMMUNITY): Payer: Self-pay

## 2015-08-26 DIAGNOSIS — F0390 Unspecified dementia without behavioral disturbance: Secondary | ICD-10-CM | POA: Diagnosis present

## 2015-08-26 DIAGNOSIS — Z7982 Long term (current) use of aspirin: Secondary | ICD-10-CM | POA: Insufficient documentation

## 2015-08-26 DIAGNOSIS — W19XXXA Unspecified fall, initial encounter: Secondary | ICD-10-CM

## 2015-08-26 DIAGNOSIS — Z79899 Other long term (current) drug therapy: Secondary | ICD-10-CM | POA: Diagnosis not present

## 2015-08-26 DIAGNOSIS — N390 Urinary tract infection, site not specified: Secondary | ICD-10-CM | POA: Diagnosis not present

## 2015-08-26 DIAGNOSIS — Y92129 Unspecified place in nursing home as the place of occurrence of the external cause: Secondary | ICD-10-CM

## 2015-08-26 LAB — URINALYSIS, ROUTINE W REFLEX MICROSCOPIC
Bilirubin Urine: NEGATIVE
Glucose, UA: NEGATIVE mg/dL
Hgb urine dipstick: NEGATIVE
KETONES UR: NEGATIVE mg/dL
LEUKOCYTES UA: NEGATIVE
NITRITE: POSITIVE — AB
PROTEIN: NEGATIVE mg/dL
Specific Gravity, Urine: 1.023 (ref 1.005–1.030)
pH: 6.5 (ref 5.0–8.0)

## 2015-08-26 LAB — COMPREHENSIVE METABOLIC PANEL
ALBUMIN: 3.6 g/dL (ref 3.5–5.0)
ALT: 11 U/L — ABNORMAL LOW (ref 14–54)
AST: 18 U/L (ref 15–41)
Alkaline Phosphatase: 82 U/L (ref 38–126)
Anion gap: 4 — ABNORMAL LOW (ref 5–15)
BUN: 21 mg/dL — AB (ref 6–20)
CHLORIDE: 113 mmol/L — AB (ref 101–111)
CO2: 29 mmol/L (ref 22–32)
Calcium: 8.8 mg/dL — ABNORMAL LOW (ref 8.9–10.3)
Creatinine, Ser: 0.59 mg/dL (ref 0.44–1.00)
GFR calc Af Amer: >60 mL/min (ref >60)
GFR calc non Af Amer: >60 mL/min (ref >60)
GLUCOSE: 88 mg/dL (ref 65–99)
POTASSIUM: 3.4 mmol/L — AB (ref 3.5–5.1)
Sodium: 146 mmol/L — ABNORMAL HIGH (ref 135–145)
Total Bilirubin: 0.7 mg/dL (ref 0.3–1.2)
Total Protein: 6.9 g/dL (ref 6.5–8.1)

## 2015-08-26 LAB — URINE MICROSCOPIC-ADD ON: RBC / HPF: NONE SEEN RBC/hpf (ref 0–5)

## 2015-08-26 LAB — DIGOXIN LEVEL

## 2015-08-26 MED ORDER — DEXTROSE 5 % IV SOLN
1.0000 g | Freq: Once | INTRAVENOUS | Status: AC
  Administered 2015-08-26: 1 g via INTRAVENOUS
  Filled 2015-08-26: qty 10

## 2015-08-26 MED ORDER — DIGOXIN 125 MCG PO TABS: 0.1250 mg | ORAL_TABLET | Freq: Once | ORAL | Status: DC

## 2015-08-26 MED ORDER — DIGOXIN 0.0625 MG HALF TABLET
0.0625 mg | ORAL_TABLET | Freq: Once | ORAL | Status: AC
  Administered 2015-08-26: 0.0625 mg via ORAL
  Filled 2015-08-26: qty 1

## 2015-08-26 MED ORDER — CEPHALEXIN 500 MG PO CAPS: 500.0000 mg | ORAL_CAPSULE | Freq: Three times a day (TID) | ORAL | 0 refills | Status: DC

## 2015-08-26 NOTE — ED Notes (Signed)
Pt positioned upright to take medication as ordered. Medication given with apple sauce.

## 2015-08-26 NOTE — Discharge Instructions (Signed)
You have been evaluated for your unwitnessed fall.  You have a urinary tract infection.  Please take antibiotic as prescribed for the full duration.  Your digoxin level is low today, please take medication regularly as prescribed and follow up with your doctor for recheck in 1 week.

## 2015-08-26 NOTE — ED Notes (Signed)
Patient calm at this time, bag with name label containing shirt a bedside

## 2015-08-26 NOTE — ED Provider Notes (Signed)
Patient presented to the ER with fall. Patient had unwitnessed fall at nursing home, found on the ground.  Face to face Exam: HEENT - PERRLA Lungs - CTAB Heart - RRR, no M/R/G Abd - S/NT/ND Neuro - alert, confused  Plan: No obvious external signs of trauma. Patient found on the floor next to her bed, does not ambulate on her own. Circumstances unclear. Basic labs, anticipate discharge.   Orpah Greek, MD 08/26/15 [phone]

## 2015-08-26 NOTE — ED Notes (Signed)
Pt sleeping when entering room.

## 2015-08-26 NOTE — ED Provider Notes (Signed)
[facility] DEPT Provider Note   CSN: [id number] Arrival date & time: 08/26/15  [id number]     History   Chief Complaint No chief complaint on file.   HPI Stacey Huang is a 82 y.o. female.  HPI   82 year old female with history of dementia, atrial fibrillation currently not on any anticoagulants, GERD Brought here via EMS from [facility] for evaluation of an unwitnessed fall. History obtained through [employer] [phone]), her nurse at the facility. Per [employer], patient was found on the ground next to her bed earlier this morning. She appears to be at baseline. No signs of injury noted. Per policy, patient sent here for further evaluation. Amber states for the past a month she only to fall twice. She is bedbound, nonverbal, recently discharged from hospice after she started to eat more and gaining weight.  She is not on any blood thinner medication. Wyvonnia Lora believes patient has been on the ground for less than 1 hour as the facility often too frequent rounding.  Past Medical History:  Diagnosis Date  . Alzheimer disease   . Atrial fibrillation   . Compression fracture of T11 vertebra   . Dementia   . E-coli UTI   . Gastro - esophageal reflux disease   . Hypokalemia   . Ischemic colitis, enteritis, or enterocolitis   . MGUS (monoclonal gammopathy of unknown significance)   . Nasal septal hematoma   . Shoulder fracture, left     Patient Active Problem List   Diagnosis Date Noted  . Chronic atrial fibrillation ([place]) 07/31/2013  . Hyperkalemia 07/31/2013  . GI bleed 07/30/2013  . Blood loss anemia 12/02/2011  . UTI (lower urinary tract infection) 08/08/2011  . Normocytic anemia 06/23/2011  . Compression fracture of T11 vertebra 06/23/2011  . MGUS (monoclonal gammopathy of unknown significance) 06/23/2011  . Shoulder fracture, left 06/12/2011  . History of fall 11/22/2010  . Nasal septal hematoma 11/22/2010  . Alzheimer's dementia 11/22/2010  . E-coli UTI   . Hypokalemia   .  Atrial fibrillation with RVR ([place]) 11/09/2010  . Dementia 11/09/2010  . Atrial fibrillation ([place])   . Gastroesophageal reflux disease     Past Surgical History:  Procedure Laterality Date  . ESOPHAGOGASTRODUODENOSCOPY  11/10/2010   Procedure: ESOPHAGOGASTRODUODENOSCOPY (EGD);  Surgeon: Beryle Beams;  Location: [facility] ENDOS[facility];  Service: Endoscopy;  Laterality: N/A;  . FLEXIBLE SIGMOIDOSCOPY  11/10/2010   Procedure: FLEXIBLE SIGMOIDOSCOPY;  Surgeon: Beryle Beams;  Location: [facility] ENDOSCOPY;  Service: Gastroenterology;  Laterality: N/A;  . unknown      OB History    No data available       Home Medications    Prior to Admission medications   Medication Sig Start Date End Date Taking? Authorizing Provider  acetaminophen (MAPAP) 325 MG tablet Take 650 mg by mouth every 6 (six) hours as needed for pain.     Historical Provider, MD  aspirin 81 MG chewable tablet Chew 81 mg by mouth daily.    Historical Provider, MD  digoxin (LANOXIN) 0.125 MG tablet Take 0.0625 mg by mouth daily. Pt takes 1/2 tab =0.0625 mg    Historical Provider, MD  diltiazem (CARDIZEM) 60 MG tablet Take 60 mg by mouth 4 (four) times daily.    Historical Provider, MD  docusate (COLACE) 50 MG/5ML liquid Take 100 mg by mouth daily.    Historical Provider, MD  Eyelid Cleansers (OCUSOFT LID SCRUB) PADS Apply 1 each topically daily.    Historical Provider, MD  ferrous fumarate (HEMOCYTE -  106 MG FE) 325 (106 FE) MG TABS tablet Take 1 tablet by mouth 2 (two) times daily.    Historical Provider, MD  metoprolol tartrate (LOPRESSOR) 25 MG tablet Take 12.5 mg by mouth 2 (two) times daily. Pt receives 1/2 tab = 12.5mg     Historical Provider, MD  Multiple Vitamin (MULTIVITAMIN WITH MINERALS) TABS tablet Take 1 tablet by mouth daily.    Historical Provider, MD    Family History Family History  Problem Relation Age of Onset  . Diabetes Mother     Social History Social History  Substance Use Topics  . Smoking status: Never  Smoker  . Smokeless tobacco: Never Used  . Alcohol use No     Allergies   Review of patient's allergies indicates no known allergies.   Review of Systems Review of Systems  Unable to perform ROS: Dementia     Physical Exam Updated Vital Signs There were no vitals taken for this visit.  Physical Exam  Constitutional: She appears well-developed and well-nourished. No distress.  Elderly female, laying in bed in a fetal position appears to be in no acute discomfort.  HENT:  Head: Normocephalic and atraumatic.  No hemotympanum, no septal hematoma, no malocclusion, no midface tenderness  Eyes: Conjunctivae are normal. Pupils are equal, round, and reactive to light.  Neck: Neck supple.  No cervical midline spine tenderness crepitus or step-off  Cardiovascular:  Irregularly irregular rhythm, no murmurs rubs or gallops  Pulmonary/Chest: Effort normal and breath sounds normal.  Abdominal: Soft. There is no tenderness.  Musculoskeletal:  No midline spine tenderness, no signs of injury noted throughout inspection of body.  Neurological: She is alert.  Patient nonverbal, able to move all extremities  Skin: No rash noted.  Psychiatric: She has a normal mood and affect.  Nursing note and vitals reviewed.    ED Treatments / Results  Labs (all labs ordered are listed, but only abnormal results are displayed) Labs Reviewed  URINALYSIS, ROUTINE W REFLEX MICROSCOPIC (NOT AT [facility]) - Abnormal; Notable for the following:       Result Value   Nitrite POSITIVE (*)    All other components within normal limits  COMPREHENSIVE METABOLIC PANEL - Abnormal; Notable for the following:    Sodium 146 (*)    Potassium 3.4 (*)    Chloride 113 (*)    BUN 21 (*)    Calcium 8.8 (*)    ALT 11 (*)    Anion gap 4 (*)    All other components within normal limits  DIGOXIN LEVEL - Abnormal; Notable for the following:    Digoxin Level <0.2 (*)    All other components within normal limits  URINE  MICROSCOPIC-ADD ON - Abnormal; Notable for the following:    Squamous Epithelial / LPF 0-5 (*)    Bacteria, UA MANY (*)    All other components within normal limits  URINE CULTURE    EKG  EKG Interpretation None       Radiology No results found.  Procedures Procedures (including critical care time)  Medications Ordered in ED Medications  cefTRIAXone (ROCEPHIN) 1 g in dextrose 5 % 50 mL IVPB (0 g Intravenous Stopped 08/26/15 0946)  digoxin (LANOXIN) tablet 0.0625 mg (0.0625 mg Oral Given 08/26/15 1049)     Initial Impression / Assessment and Plan / ED Course  I have reviewed the triage vital signs and the nursing notes.  Pertinent labs & imaging results that were available during my care of the patient were  reviewed by me and considered in my medical decision making (see chart for details).  Clinical Course    BP 102/73 (BP Location: Right Arm)   Pulse 79   Temp 97.1 F (36.2 C) (Axillary)   Resp 20   SpO2 97%    Final Clinical Impressions(s) / ED Diagnoses   Final diagnoses:  Fall at nursing home, initial encounter  UTI (lower urinary tract infection)    New Prescriptions New Prescriptions   No medications on file   6:57 AM Pt here for an unwitnessed fall.  No signs of injury.  Hx of dementia.  Will obtain screening labs, EKG, UA, and digoxin level.  Care discussed with DR. Pollina.    8:25 AM UA shows evidence of a urinary tract infection. Will treat with Rocephin. Her Digoxin level is subtherapeutic (<0.2ng/ml).  Will give a loading dose here.     Domenic Moras, PA-C 08/26/15 [place], MD 08/30/15 0730

## 2015-08-26 NOTE — ED Notes (Signed)
Bed: WA06 Expected date:  Expected time:  Means of arrival:  Comments: EMS 82yo F found on floor

## 2015-08-26 NOTE — ED Notes (Signed)
Per EMS- from [facility]- was found on floor sitting up next to bed with another pt in the bed. No visible injuries observed. Acting at baseline- nonverbal.

## 2015-08-28 LAB — URINE CULTURE

## 2015-08-29 ENCOUNTER — Telehealth: Payer: Self-pay | Admitting: Emergency Medicine

## 2015-08-29 ENCOUNTER — Telehealth (HOSPITAL_BASED_OUTPATIENT_CLINIC_OR_DEPARTMENT_OTHER): Payer: Self-pay | Admitting: Emergency Medicine

## 2015-08-29 NOTE — Telephone Encounter (Signed)
Post ED Visit - Positive Culture Follow-up: Successful Patient Follow-Up  Culture assessed and recommendations reviewed by: []  Elenor Quinones, Pharm.D. []  Heide Guile, Pharm.D., BCPS [x]  Parks Neptune, Pharm.D. []  Alycia Rossetti, Pharm.D., BCPS []  [place], [place].D., BCPS, AAHIVP []  Legrand Como, Pharm.D., BCPS, AAHIVP []  Milus Glazier, Pharm.D. []  Stephens November, Pharm.D.  Positive urine culture  []  Patient discharged without antimicrobial prescription and treatment is now indicated [x]  Organism is resistant to prescribed ED discharge antimicrobial []  Patient with positive blood cultures  Changes discussed with ED provider: Quincy Carnes PA New antibiotic prescription d/c keflex, start macrobid 100mg  po bid x 1 week  Attempting to contact patient   Hazle Nordmann 08/29/2015, 2:34 PM

## 2015-08-29 NOTE — Progress Notes (Signed)
ED Antimicrobial Stewardship Positive Culture Follow Up   Stacey Huang is an 82 y.o. female who presented to [facility] on 08/26/2015 with a chief complaint of  Chief Complaint  Patient presents with  . Fall    Recent Results (from the past 720 hour(s))  Urine culture     Status: Abnormal   Collection Time: 08/26/15  7:14 AM  Result Value Ref Range Status   Specimen Description URINE, RANDOM  Final   Special Requests NONE  Final   Culture >=100,000 COLONIES/mL ESCHERICHIA COLI (A)  Final   Report Status 08/28/2015 FINAL  Final   Organism ID, Bacteria ESCHERICHIA COLI (A)  Final      Susceptibility   Escherichia coli - MIC*    AMPICILLIN >=32 RESISTANT Resistant     CEFAZOLIN >=64 RESISTANT Resistant     CEFTRIAXONE <=1 SENSITIVE Sensitive     CIPROFLOXACIN >=4 RESISTANT Resistant     GENTAMICIN <=1 SENSITIVE Sensitive     IMIPENEM <=0.25 SENSITIVE Sensitive     NITROFURANTOIN <=16 SENSITIVE Sensitive     TRIMETH/SULFA <=20 SENSITIVE Sensitive     AMPICILLIN/SULBACTAM >=32 RESISTANT Resistant     PIP/TAZO 8 SENSITIVE Sensitive     Extended ESBL NEGATIVE Sensitive     * >=100,000 COLONIES/mL ESCHERICHIA COLI    [x]  Treated with cephalexin, organism resistant to prescribed antimicrobial []  Patient discharged originally without antimicrobial agent and treatment is now indicated  New antibiotic prescription: macrobid 100 mg bid x 1 week  ED Provider: Harlene Ramus, PA-C  Wynell Balloon 08/29/2015, 8:29 AM Infectious Diseases Pharmacist Phone# [phone]

## 2015-10-26 ENCOUNTER — Telehealth (HOSPITAL_BASED_OUTPATIENT_CLINIC_OR_DEPARTMENT_OTHER): Payer: Self-pay | Admitting: Emergency Medicine

## 2015-10-26 NOTE — Telephone Encounter (Signed)
Lost to followup 

## 2016-02-07 ENCOUNTER — Inpatient Hospital Stay (HOSPITAL_COMMUNITY)
Admission: EM | Admit: 2016-02-07 | Discharge: 2016-02-12 | DRG: 640 | Disposition: A | Discharge status: Hospice | Payer: Medicare Other | Attending: Internal Medicine | Admitting: Internal Medicine

## 2016-02-07 ENCOUNTER — Emergency Department (HOSPITAL_COMMUNITY): Payer: Medicare Other

## 2016-02-07 ENCOUNTER — Inpatient Hospital Stay (HOSPITAL_COMMUNITY): Payer: Medicare Other

## 2016-02-07 ENCOUNTER — Encounter (HOSPITAL_COMMUNITY): Payer: Self-pay

## 2016-02-07 DIAGNOSIS — D649 Anemia, unspecified: Secondary | ICD-10-CM | POA: Diagnosis present

## 2016-02-07 DIAGNOSIS — R197 Diarrhea, unspecified: Secondary | ICD-10-CM | POA: Diagnosis present

## 2016-02-07 DIAGNOSIS — K219 Gastro-esophageal reflux disease without esophagitis: Secondary | ICD-10-CM | POA: Diagnosis present

## 2016-02-07 DIAGNOSIS — E870 Hyperosmolality and hypernatremia: Secondary | ICD-10-CM | POA: Diagnosis present

## 2016-02-07 DIAGNOSIS — I4891 Unspecified atrial fibrillation: Secondary | ICD-10-CM | POA: Diagnosis present

## 2016-02-07 DIAGNOSIS — E876 Hypokalemia: Secondary | ICD-10-CM | POA: Diagnosis not present

## 2016-02-07 DIAGNOSIS — G9341 Metabolic encephalopathy: Secondary | ICD-10-CM | POA: Diagnosis present

## 2016-02-07 DIAGNOSIS — G309 Alzheimer's disease, unspecified: Secondary | ICD-10-CM | POA: Diagnosis present

## 2016-02-07 DIAGNOSIS — Z515 Encounter for palliative care: Secondary | ICD-10-CM | POA: Diagnosis present

## 2016-02-07 DIAGNOSIS — D6959 Other secondary thrombocytopenia: Secondary | ICD-10-CM | POA: Diagnosis present

## 2016-02-07 DIAGNOSIS — R4182 Altered mental status, unspecified: Secondary | ICD-10-CM | POA: Diagnosis present

## 2016-02-07 DIAGNOSIS — R296 Repeated falls: Secondary | ICD-10-CM | POA: Diagnosis present

## 2016-02-07 DIAGNOSIS — Z79899 Other long term (current) drug therapy: Secondary | ICD-10-CM

## 2016-02-07 DIAGNOSIS — I129 Hypertensive chronic kidney disease with stage 1 through stage 4 chronic kidney disease, or unspecified chronic kidney disease: Secondary | ICD-10-CM | POA: Diagnosis present

## 2016-02-07 DIAGNOSIS — R1310 Dysphagia, unspecified: Secondary | ICD-10-CM | POA: Diagnosis present

## 2016-02-07 DIAGNOSIS — F0280 Dementia in other diseases classified elsewhere without behavioral disturbance: Secondary | ICD-10-CM | POA: Diagnosis present

## 2016-02-07 DIAGNOSIS — Z7401 Bed confinement status: Secondary | ICD-10-CM

## 2016-02-07 DIAGNOSIS — N179 Acute kidney failure, unspecified: Secondary | ICD-10-CM | POA: Diagnosis present

## 2016-02-07 DIAGNOSIS — F0390 Unspecified dementia without behavioral disturbance: Secondary | ICD-10-CM | POA: Diagnosis present

## 2016-02-07 DIAGNOSIS — J9601 Acute respiratory failure with hypoxia: Secondary | ICD-10-CM | POA: Diagnosis present

## 2016-02-07 DIAGNOSIS — E861 Hypovolemia: Secondary | ICD-10-CM | POA: Diagnosis present

## 2016-02-07 DIAGNOSIS — R0902 Hypoxemia: Secondary | ICD-10-CM

## 2016-02-07 DIAGNOSIS — N183 Chronic kidney disease, stage 3 unspecified: Secondary | ICD-10-CM

## 2016-02-07 DIAGNOSIS — G9340 Encephalopathy, unspecified: Secondary | ICD-10-CM | POA: Diagnosis not present

## 2016-02-07 DIAGNOSIS — E43 Unspecified severe protein-calorie malnutrition: Secondary | ICD-10-CM | POA: Diagnosis present

## 2016-02-07 DIAGNOSIS — Z681 Body mass index (BMI) 19 or less, adult: Secondary | ICD-10-CM | POA: Diagnosis not present

## 2016-02-07 DIAGNOSIS — E860 Dehydration: Secondary | ICD-10-CM | POA: Diagnosis present

## 2016-02-07 DIAGNOSIS — Z66 Do not resuscitate: Secondary | ICD-10-CM | POA: Diagnosis present

## 2016-02-07 DIAGNOSIS — Z9181 History of falling: Secondary | ICD-10-CM

## 2016-02-07 DIAGNOSIS — I959 Hypotension, unspecified: Secondary | ICD-10-CM | POA: Diagnosis not present

## 2016-02-07 DIAGNOSIS — Z7982 Long term (current) use of aspirin: Secondary | ICD-10-CM

## 2016-02-07 DIAGNOSIS — Z8719 Personal history of other diseases of the digestive system: Secondary | ICD-10-CM

## 2016-02-07 LAB — COMPREHENSIVE METABOLIC PANEL
ALT: 22 U/L (ref 14–54)
AST: 25 U/L (ref 15–41)
Albumin: 3.3 g/dL — ABNORMAL LOW (ref 3.5–5.0)
Alkaline Phosphatase: 65 U/L (ref 38–126)
BUN: 44 mg/dL — AB (ref 6–20)
CO2: 31 mmol/L (ref 22–32)
Calcium: 8.7 mg/dL — ABNORMAL LOW (ref 8.9–10.3)
Creatinine, Ser: 1.11 mg/dL — ABNORMAL HIGH (ref 0.44–1.00)
GFR calc Af Amer: 49 mL/min — ABNORMAL LOW (ref >60)
GFR calc non Af Amer: 42 mL/min — ABNORMAL LOW (ref >60)
Glucose, Bld: 102 mg/dL — ABNORMAL HIGH (ref 65–99)
POTASSIUM: 3 mmol/L — AB (ref 3.5–5.1)
Sodium: 171 mmol/L (ref 135–145)
Total Bilirubin: 0.2 mg/dL — ABNORMAL LOW (ref 0.3–1.2)
Total Protein: 6.8 g/dL (ref 6.5–8.1)

## 2016-02-07 LAB — URINALYSIS, ROUTINE W REFLEX MICROSCOPIC
Bilirubin Urine: NEGATIVE
GLUCOSE, UA: NEGATIVE mg/dL
HGB URINE DIPSTICK: NEGATIVE
Ketones, ur: NEGATIVE mg/dL
LEUKOCYTES UA: NEGATIVE
Nitrite: NEGATIVE
PH: 5 (ref 5.0–8.0)
PROTEIN: NEGATIVE mg/dL
SPECIFIC GRAVITY, URINE: 1.026 (ref 1.005–1.030)

## 2016-02-07 LAB — CBC WITH DIFFERENTIAL/PLATELET
Basophils Absolute: 0 10*3/uL (ref 0.0–0.1)
Basophils Relative: 0 %
Eosinophils Absolute: 0.2 10*3/uL (ref 0.0–0.7)
Eosinophils Relative: 2 %
HEMATOCRIT: 34.7 % — AB (ref 36.0–46.0)
Hemoglobin: 10.3 g/dL — ABNORMAL LOW (ref 12.0–15.0)
LYMPHS ABS: 2.3 10*3/uL (ref 0.7–4.0)
LYMPHS PCT: 27 %
MCH: 28 pg (ref 26.0–34.0)
MCHC: 29.7 g/dL — ABNORMAL LOW (ref 30.0–36.0)
MCV: 94.3 fL (ref 78.0–100.0)
Monocytes Absolute: 0.4 10*3/uL (ref 0.1–1.0)
Monocytes Relative: 4 %
NEUTROS ABS: 5.6 10*3/uL (ref 1.7–7.7)
Neutrophils Relative %: 67 %
PLATELETS: 197 10*3/uL (ref 150–400)
RBC: 3.68 MIL/uL — AB (ref 3.87–5.11)
RDW: 16.1 % — ABNORMAL HIGH (ref 11.5–15.5)
WBC: 8.5 10*3/uL (ref 4.0–10.5)

## 2016-02-07 LAB — I-STAT CHEM 8, ED
BUN: 39 mg/dL — ABNORMAL HIGH (ref 6–20)
Calcium, Ion: 1.18 mmol/L (ref 1.15–1.40)
Chloride: 134 mmol/L (ref 101–111)
Creatinine, Ser: 1.2 mg/dL — ABNORMAL HIGH (ref 0.44–1.00)
GLUCOSE: 94 mg/dL (ref 65–99)
HCT: 33 % — ABNORMAL LOW (ref 36.0–46.0)
Hemoglobin: 11.2 g/dL — ABNORMAL LOW (ref 12.0–15.0)
POTASSIUM: 2.7 mmol/L — AB (ref 3.5–5.1)
Sodium: 176 mmol/L (ref 135–145)
TCO2: 33 mmol/L (ref 0–100)

## 2016-02-07 LAB — DIGOXIN LEVEL: Digoxin Level: <0.2 ng/mL — ABNORMAL LOW (ref 0.8–2.0)

## 2016-02-07 MED ORDER — HEPARIN SODIUM (PORCINE) 5000 UNIT/ML IJ SOLN
5000.0000 [IU] | Freq: Three times a day (TID) | INTRAMUSCULAR | Status: DC
  Administered 2016-02-07 – 2016-02-08 (×4): 5000 [IU] via SUBCUTANEOUS
  Filled 2016-02-07 (×4): qty 1

## 2016-02-07 MED ORDER — KCL IN DEXTROSE-NACL 30-5-0.45 MEQ/L-%-% IV SOLN
INTRAVENOUS | Status: DC
  Administered 2016-02-07 – 2016-02-08 (×2): via INTRAVENOUS
  Filled 2016-02-07 (×3): qty 1000

## 2016-02-07 MED ORDER — METOPROLOL TARTRATE 12.5 MG HALF TABLET
12.5000 mg | ORAL_TABLET | Freq: Two times a day (BID) | ORAL | Status: DC
  Administered 2016-02-07 – 2016-02-09 (×4): 12.5 mg via ORAL
  Filled 2016-02-07 (×5): qty 1

## 2016-02-07 MED ORDER — ASPIRIN 81 MG PO CHEW
81.0000 mg | CHEWABLE_TABLET | Freq: Every day | ORAL | Status: DC
  Administered 2016-02-07 – 2016-02-09 (×3): 81 mg via ORAL
  Filled 2016-02-07 (×4): qty 1

## 2016-02-07 MED ORDER — DILTIAZEM HCL 60 MG PO TABS
60.0000 mg | ORAL_TABLET | Freq: Four times a day (QID) | ORAL | Status: DC
  Administered 2016-02-07 – 2016-02-08 (×5): 60 mg via ORAL
  Filled 2016-02-07 (×9): qty 1

## 2016-02-07 MED ORDER — ALBUTEROL SULFATE (2.5 MG/3ML) 0.083% IN NEBU: 2.5000 mg | INHALATION_SOLUTION | RESPIRATORY_TRACT | Status: DC | PRN

## 2016-02-07 MED ORDER — DIGOXIN 0.0625 MG HALF TABLET
0.0625 mg | ORAL_TABLET | Freq: Every day | ORAL | Status: DC
  Administered 2016-02-08: 0.0625 mg via ORAL
  Filled 2016-02-07 (×3): qty 1

## 2016-02-07 MED ORDER — SENNOSIDES-DOCUSATE SODIUM 8.6-50 MG PO TABS
1.0000 | ORAL_TABLET | Freq: Every day | ORAL | Status: DC
  Administered 2016-02-07 – 2016-02-09 (×3): 1 via ORAL
  Filled 2016-02-07 (×4): qty 1

## 2016-02-07 MED ORDER — ONDANSETRON HCL 4 MG PO TABS: 4.0000 mg | ORAL_TABLET | Freq: Four times a day (QID) | ORAL | Status: DC | PRN

## 2016-02-07 MED ORDER — OCUSOFT LID SCRUB EX PADS: 1.0000 | MEDICATED_PAD | Freq: Every day | CUTANEOUS | Status: DC

## 2016-02-07 MED ORDER — ONDANSETRON HCL 4 MG/2ML IJ SOLN: 4.0000 mg | Freq: Four times a day (QID) | INTRAMUSCULAR | Status: DC | PRN

## 2016-02-07 NOTE — ED Triage Notes (Signed)
Pt not behaving in her usual manner this AM normally constant rambling conversation but this morning was mute but arousable

## 2016-02-07 NOTE — ED Notes (Signed)
Bed: WA06 Expected date:  Expected time:  Means of arrival:  Comments: AMS/dementia

## 2016-02-07 NOTE — H&P (Addendum)
[facility] H&P   Patient Demographics:    Stacey Huang, is a 83 y.o. female  MRN: [id number]   DOB - [birth year]  Admit Date - 02/07/2016  Outpatient Primary MD for the patient is Reymundo Poll, MD  Referring MD/NP/PA: Dr Rex Kras  Patient coming from: SNF  Chief Complaint  Patient presents with  . Altered Mental Status      HPI:    Stacey Huang  is a 83 y.o. female, With past medical history of ischemic colitis, hypertension, severe dementia/Alzheimer's dementia, atrial fibrillation not on anticoagulation secondary to falls, patient is SNF resident, she was brought to ED for worsening mental status, at baseline patient is nonverbal, total assist,, she mumbles occasionally, but she was noted by SNF facility to be less responsive, which prompted her to bring her to ED, certainly patient can't provide any history, history was obtained from son and ED staff, there is no documentation of fever, diarrhea, vomiting, cough, in ED patient with negative urinalysis, chest x-ray with no acute finding, but she is clinically dehydrated, with a sodium of 176, potassium of 2.7, no leukocytosis, I was called to admit.    Review of systems:    She is unable to provide any review of system  With Past History of the following :    Past Medical History:  Diagnosis Date  . Alzheimer disease   . Atrial fibrillation ([place])   . Compression fracture of T11 vertebra   . Dementia   . E-coli UTI   . Gastro - esophageal reflux disease   . Hypokalemia   . Ischemic colitis, enteritis, or enterocolitis ([place])   . MGUS (monoclonal gammopathy of unknown significance)   . Nasal septal hematoma   . Shoulder fracture, left       Past Surgical History:  Procedure Laterality Date  . ESOPHAGOGASTRODUODENOSCOPY  11/10/2010   Procedure: ESOPHAGOGASTRODUODENOSCOPY (EGD);  Surgeon: Beryle Beams;  Location: [facility]  ENDOSCOPY;  Service: Endoscopy;  Laterality: N/A;  . FLEXIBLE SIGMOIDOSCOPY  11/10/2010   Procedure: FLEXIBLE SIGMOIDOSCOPY;  Surgeon: Beryle Beams;  Location: [facility] ENDOS[facility];  Service: Gastroenterology;  Laterality: N/A;  . unknown        Social History:     Social History  Substance Use Topics  . Smoking status: Never Smoker  . Smokeless tobacco: Never Used  . Alcohol use No     Lives - At SNF  Mobility - total assist     Family History :     Family History  Problem Relation Age of Onset  . Diabetes Mother      Home Medications:   Prior to Admission medications   Medication Sig Start Date End Date Taking? Authorizing Provider  acetaminophen (MAPAP) 325 MG tablet Take 650 mg by mouth every 6 (six) hours as needed for pain.    Yes Historical Provider, MD  aspirin 81 MG chewable tablet Chew  81 mg by mouth daily.   Yes Historical Provider, MD  bisacodyl (BISAC-EVAC) 10 MG suppository Place 10 mg rectally daily as needed (For constipation.).   Yes Historical Provider, MD  digoxin (LANOXIN) 0.125 MG tablet Take 0.0625 mg by mouth daily. Pt takes 1/2 tab =0.0625 mg   Yes Historical Provider, MD  diltiazem (CARDIZEM) 60 MG tablet Take 60 mg by mouth every 6 (six) hours.    Yes Historical Provider, MD  Eyelid Cleansers (OCUSOFT LID SCRUB) PADS Apply 1 each topically daily. Apply to eyelids to clean eyes every day.   Yes Historical Provider, MD  furosemide (LASIX) 20 MG tablet Take 20 mg by mouth daily.   Yes Historical Provider, MD  metoprolol tartrate (LOPRESSOR) 25 MG tablet Take 12.5 mg by mouth 2 (two) times daily. Pt receives 1/2 tab = 12.5mg    Yes Historical Provider, MD  mirtazapine (REMERON) 15 MG tablet Take 15 mg by mouth at bedtime.   Yes Historical Provider, MD  Multiple Vitamin (DAILY-VITE PO) Take 1 tablet by mouth daily.   Yes Historical Provider, MD  nystatin (NYSTATIN) powder Apply 1 g topically 2 (two) times daily as needed (Apply under both breasts for rash.).     Yes Historical Provider, MD  senna-docusate (SENEXON-S) 8.6-50 MG tablet Take 1 tablet by mouth daily.   Yes Historical Provider, MD  zinc oxide 20 % ointment Apply 1 application topically 2 (two) times daily.   Yes Historical Provider, MD     Allergies:    No Known Allergies   Physical Exam:   Vitals  Blood pressure (!) 144/124, pulse 105, temperature 97.4 F (36.3 C), temperature source Oral, resp. rate 15, SpO2 99 %.   1. General frail, chronically ill-appearing female  lying in bed in NAD,    2. Nonverbal, noncommunicative, does not respond to any questions moaning occasionally  3. Unable to perform rectal exam to consider treatment with status, but she does withdraw her upper extremities to pain   4. Ears and Eyes appear Normal, Conjunctivae clear, PERRLA. Dry Oral Mucosa.  5. Supple Neck, No JVD, No Carotid Bruits.  6. Symmetrical Chest wall movement, Good air movement bilaterally, CTAB.  7. RRR, No Gallops, Rubs or Murmurs, No Parasternal Heave.  8. Positive Bowel Sounds, Abdomen Soft, No tenderness, No organomegaly appriciated,No rebound -guarding or rigidity.  9.  No Cyanosis, delayed, No Skin Rash or Bruise.  10. Nephrocaps muscle hypertrophy,  joints appear normal , no effusions,   11. No Palpable Lymph nodes in  Neck     Data Review:    CBC  Recent Labs Lab 02/07/16 0753 02/07/16 1059  WBC 8.5  --   HGB 10.3* 11.2*  HCT 34.7* 33.0*  PLT 197  --   MCV 94.3  --   MCH 28.0  --   MCHC 29.7*  --   RDW 16.1*  --   LYMPHSABS 2.3  --   MONOABS 0.4  --   EOSABS 0.2  --   BASOSABS 0.0  --    ------------------------------------------------------------------------------------------------------------------  Chemistries   Recent Labs Lab 02/07/16 0753 02/07/16 1059  NA 171* 176*  K 3.0* 2.7*  CL >130* 134*  CO2 31  --   GLUCOSE 102* 94  BUN 44* 39*  CREATININE 1.11* 1.20*  CALCIUM 8.7*  --   AST 25  --   ALT 22  --   ALKPHOS 65  --     BILITOT 0.2*  --    ------------------------------------------------------------------------------------------------------------------ CrCl  cannot be calculated (Unknown ideal weight.). ------------------------------------------------------------------------------------------------------------------ No results for input(s): TSH, T4TOTAL, T3FREE, THYROIDAB in the last 72 hours.  Invalid input(s): FREET3  Coagulation profile No results for input(s): INR, PROTIME in the last 168 hours. ------------------------------------------------------------------------------------------------------------------- No results for input(s): DDIMER in the last 72 hours. -------------------------------------------------------------------------------------------------------------------  Cardiac Enzymes No results for input(s): CKMB, TROPONINI, MYOGLOBIN in the last 168 hours.  Invalid input(s): CK ------------------------------------------------------------------------------------------------------------------ No results found for: BNP   ---------------------------------------------------------------------------------------------------------------  Urinalysis    Component Value Date/Time   COLORURINE YELLOW 02/07/2016 0807   APPEARANCEUR CLEAR 02/07/2016 0807   LABSPEC 1.026 02/07/2016 0807   PHURINE 5.0 02/07/2016 0807   GLUCOSEU NEGATIVE 02/07/2016 0807   HGBUR NEGATIVE 02/07/2016 0807   BILIRUBINUR NEGATIVE 02/07/2016 0807   KETONESUR NEGATIVE 02/07/2016 0807   PROTEINUR NEGATIVE 02/07/2016 0807   UROBILINOGEN 0.2 12/01/2011 1130   NITRITE NEGATIVE 02/07/2016 0807   LEUKOCYTESUR NEGATIVE 02/07/2016 0807    ----------------------------------------------------------------------------------------------------------------   Imaging Results:    Dg Chest Port 1 View  Result Date: 02/07/2016 CLINICAL DATA:  Altered mental status EXAM: PORTABLE CHEST 1 VIEW COMPARISON:  12/01/2011 FINDINGS:  Chronic cardiomegaly. Negative mediastinal contours. Interstitial coarsening greater at the left base where there is also volume loss, compatible with atelectasis. No air bronchogram, Kerley line, effusion, or pneumothorax. Osteopenia. Remote proximal left humerus fracture. IMPRESSION: 1. Mild atelectasis at the left base. 2. Chronic cardiomegaly.  No pulmonary edema. Electronically Signed   By: Monte Fantasia M.D.   On: 02/07/2016 07:25       Assessment & Plan:    Active Problems:   Atrial fibrillation (HCC)   Gastroesophageal reflux disease   Dementia   Hypokalemia   History of fall   Alzheimer's dementia   Hypernatremia   CKD (chronic kidney disease) stage 3, GFR 30-59 ml/min   Acute encephalopathy - This is secondary to volume depletion and dehydration, and hypernatremia, with baseline severe dementia, will replete electrolyte abnormalities and then reassess, will obtain CT head without contrast. - There is no evidence of infectious process at this point to precipitate her poor oral intake, discussed with son it may be secondary to her advanced dementia, will hydrate, repeat electrolytes, then we'll reassess oral intake which will help [place] determine her goals of care. - Will have SLP evaluate when mentation improves, meanwhile will keep nothing by mouth except meds  Hypernatremia - Continue to hypovolemia and dehydration, will start on D5 half-normal saline, will correct her hypernatremia slowly, 10 millimole daily  Hypokalemia - Will replete with IV fluids, recheck in a.m. with magnesium and phosphorus level  CK D stage III - At baseline, continue with IV fluids, continue to hold Lasix  Alzheimer's dementia - Resume Remeron were mentation back to baseline  Hypertension - Continue with home medication  Atrial fibrillation - As per reviewing records, she's not anticoagulation candidate due to multiple falls and history of GI bleed - And tinea with aspirin, continue with  tamoxifen, Cardizem and metoprolol for heart rate control     DVT Prophylaxis Heparin -   SCDs   AM Labs Ordered, also please review Full Orders  Family Communication: Admission, patients condition and plan of care including tests being ordered have been discussed with son who indicate understanding and agree with the plan and Code Status.  Code Status DO NOT RESUSCITATE, has DO NOT RESUSCITATE form from SNF,  Likely DC to  back to SNF  Condition GUARDED /poor  Consults called: None  Admission status: Inpatient  Time  spent in minutes : 65 minutes   ELGERGAWY, DAWOOD M.D on 02/07/2016 at 11:15 AM  Between 7am to 7pm - Pager - [phone]. After 7pm go to [email or web address] - password [facility]  [facility] - Office  [phone]

## 2016-02-07 NOTE — ED Notes (Signed)
Primary RN made aware of critical lab results.

## 2016-02-07 NOTE — ED Provider Notes (Signed)
I received pt in signout from Dr. Roxanne Mins. We were awaiting lab work as part of workup for her change in mental status baseline. Labwork notable for sodium of 171, potassium of 3, chloride greater than 1:30, BUNs 44, creatinine 1.1. Unremarkable UA and digoxin level low. I suspect that the severe hypernatremia may be contributing to the patient's symptoms. Her lab work was reconfirmed with a repeat chem 8. I discussed with Dr. Sigurd Sos, I appreciate his assistance. He will admit for treatment of hypernatremia and hypokalemia as well as further workup. He will place orders for fluid hydration and potassium repletion. Patient admitted for further care.  CRITICAL CARE Performed by: Wenda Overland Keyana Guevara   Total critical care time: 30 minutes  Critical care time was exclusive of separately billable procedures and treating other patients.  Critical care was necessary to treat or prevent imminent or life-threatening deterioration.  Critical care was time spent personally by me on the following activities: development of treatment plan with patient and/or surrogate as well as nursing, discussions with consultants, evaluation of patient's response to treatment, examination of patient, obtaining history from patient or surrogate, ordering and performing treatments and interventions, ordering and review of laboratory studies, ordering and review of radiographic studies, pulse oximetry and re-evaluation of patient's condition.    Sharlett Iles, MD 02/07/16 [phone]

## 2016-02-07 NOTE — ED Provider Notes (Signed)
[place] DEPT Provider Note   CSN: [id number] Arrival date & time: 02/07/16  [id number]     History   Chief Complaint Chief Complaint  Patient presents with  . Altered Mental Status    HPI Stacey Huang is a 83 y.o. female.  She was sent from nursing home because of altered mentation. She is apparently someone who normally rambles incessantly but, is now nonverbal. She has severe baseline dementia and I am not able to get any history whatsoever from her.   The history is provided by the nursing home. The history is limited by the condition of the patient (Dementia).  Altered Mental Status      Past Medical History:  Diagnosis Date  . Alzheimer disease   . Atrial fibrillation ([place])   . Compression fracture of T11 vertebra   . Dementia   . E-coli UTI   . Gastro - esophageal reflux disease   . Hypokalemia   . Ischemic colitis, enteritis, or enterocolitis ([place])   . MGUS (monoclonal gammopathy of unknown significance)   . Nasal septal hematoma   . Shoulder fracture, left     Patient Active Problem List   Diagnosis Date Noted  . Chronic atrial fibrillation ([place]) 07/31/2013  . Hyperkalemia 07/31/2013  . GI bleed 07/30/2013  . Blood loss anemia 12/02/2011  . UTI (lower urinary tract infection) 08/08/2011  . Normocytic anemia 06/23/2011  . Compression fracture of T11 vertebra 06/23/2011  . MGUS (monoclonal gammopathy of unknown significance) 06/23/2011  . Shoulder fracture, left 06/12/2011  . History of fall 11/22/2010  . Nasal septal hematoma 11/22/2010  . Alzheimer's dementia 11/22/2010  . E-coli UTI   . Hypokalemia   . Atrial fibrillation with RVR ([place]) 11/09/2010  . Dementia 11/09/2010  . Atrial fibrillation ([place])   . Gastroesophageal reflux disease     Past Surgical History:  Procedure Laterality Date  . ESOPHAGOGASTRODUODENOSCOPY  11/10/2010   Procedure: ESOPHAGOGASTRODUODENOSCOPY (EGD);  Surgeon: Beryle Beams;  Location: [facility];  Service:  Endoscopy;  Laterality: N/A;  . FLEXIBLE SIGMOIDOSCOPY  11/10/2010   Procedure: FLEXIBLE SIGMOIDOSCOPY;  Surgeon: Beryle Beams;  Location: [facility];  Service: Gastroenterology;  Laterality: N/A;  . unknown      OB History    No data available       Home Medications    Prior to Admission medications   Medication Sig Start Date End Date Taking? Authorizing Provider  acetaminophen (MAPAP) 325 MG tablet Take 650 mg by mouth every 6 (six) hours as needed for pain.     Historical Provider, MD  aspirin 81 MG chewable tablet Chew 81 mg by mouth daily.    Historical Provider, MD  bisacodyl (DULCOLAX) 10 MG suppository Place 10 mg rectally as needed for mild constipation or moderate constipation.    Historical Provider, MD  cephALEXin (KEFLEX) 500 MG capsule Take 1 capsule (500 mg total) by mouth 3 (three) times daily. 08/26/15   Domenic Moras, PA-C  digoxin (LANOXIN) 0.125 MG tablet Take 0.0625 mg by mouth daily. Pt takes 1/2 tab =0.0625 mg    Historical Provider, MD  diltiazem (CARDIZEM) 60 MG tablet Take 60 mg by mouth 4 (four) times daily.    Historical Provider, MD  Eyelid Cleansers (OCUSOFT LID SCRUB) PADS Apply 1 each topically daily.    Historical Provider, MD  furosemide (LASIX) 20 MG tablet Take 20 mg by mouth daily.    Historical Provider, MD  metoprolol tartrate (LOPRESSOR) 25 MG tablet Take 12.5 mg by  mouth 2 (two) times daily. Pt receives 1/2 tab = 12.5mg     Historical Provider, MD  Multiple Vitamin (MULTIVITAMIN WITH MINERALS) TABS tablet Take 1 tablet by mouth daily.    Historical Provider, MD  nystatin (NYSTATIN) powder Apply 1 g topically 2 (two) times daily.    Historical Provider, MD  sennosides-docusate sodium (SENOKOT-S) 8.6-50 MG tablet Take 1 tablet by mouth daily.    Historical Provider, MD  Skin Protectants, Misc. (DIMETHICONE-ZINC OXIDE) cream Apply 1 application topically 2 (two) times daily as needed for dry skin.    Historical Provider, MD    Family History Family  History  Problem Relation Age of Onset  . Diabetes Mother     Social History Social History  Substance Use Topics  . Smoking status: Never Smoker  . Smokeless tobacco: Never Used  . Alcohol use No     Allergies   Patient has no known allergies.   Review of Systems Review of Systems  Unable to perform ROS: Dementia     Physical Exam Updated Vital Signs BP 145/76 (BP Location: Right Arm)   Pulse (!) 58   Temp 97.4 F (36.3 C) (Oral)   Resp 15   SpO2 96%   Physical Exam  Nursing note and vitals reviewed.  Cachectic and contracted 83 year old female, resting comfortably and in no acute distress. Vital signs are significant for systolic hypertension and borderline bradycardia. Oxygen saturation is 96%, which is normal. Head is normocephalic and atraumatic. PERRLA, EOMI. Oropharynx is clear. Eyes are sunken, mucous membranes are dry. Neck is nontender and supple without adenopathy or JVD. Back is nontender and there is no CVA tenderness. Lungs are clear without rales, wheezes, or rhonchi. Chest is nontender. Heart has regular rate and rhythm without murmur. Abdomen is soft, flat, nontender without masses or hepatosplenomegaly and peristalsis is normoactive. Extremities have no cyanosis or edema, full range of motion is present. Skin is warm and dry without rash. Neurologic: She is awake but nonverbal, does not follow commands. No focal motor or sensory deficits.  ED Treatments / Results  Labs (all labs ordered are listed, but only abnormal results are displayed) Labs Reviewed  COMPREHENSIVE METABOLIC PANEL  CBC WITH DIFFERENTIAL/PLATELET  URINALYSIS, ROUTINE W REFLEX MICROSCOPIC  DIGOXIN LEVEL    EKG  EKG Interpretation None       Radiology Dg Chest Port 1 View  Result Date: 02/07/2016 CLINICAL DATA:  Altered mental status EXAM: PORTABLE CHEST 1 VIEW COMPARISON:  12/01/2011 FINDINGS: Chronic cardiomegaly. Negative mediastinal contours. Interstitial  coarsening greater at the left base where there is also volume loss, compatible with atelectasis. No air bronchogram, Kerley line, effusion, or pneumothorax. Osteopenia. Remote proximal left humerus fracture. IMPRESSION: 1. Mild atelectasis at the left base. 2. Chronic cardiomegaly.  No pulmonary edema. Electronically Signed   By: Monte Fantasia M.D.   On: 02/07/2016 07:25    Procedures Procedures (including critical care time)  Medications Ordered in ED Medications - No data to display   Initial Impression / Assessment and Plan / ED Course  I have reviewed the triage vital signs and the nursing notes.  Pertinent labs & imaging results that were available during my care of the patient were reviewed by me and considered in my medical decision making (see chart for details).  Altered mental status. Patient appears somewhat dehydrated. We'll check screening labs and a chest x-ray. She is DO NOT RESUSCITATE. Review of old records shows history of atrial fibrillation and a GI  bleed. She is not on any anticoagulants currently. She is on digoxin, so we'll check digoxin level.  Chest x-ray shows no acute process. Case is signed out to Dr. Rex Kras to evaluate laboratory reports.  Final Clinical Impressions(s) / ED Diagnoses   Final diagnoses:  Altered mental status, unspecified altered mental status type    New Prescriptions New Prescriptions   No medications on file     Delora Fuel, MD [id number]

## 2016-02-08 DIAGNOSIS — N183 Chronic kidney disease, stage 3 (moderate): Secondary | ICD-10-CM

## 2016-02-08 DIAGNOSIS — I4891 Unspecified atrial fibrillation: Secondary | ICD-10-CM

## 2016-02-08 DIAGNOSIS — E870 Hyperosmolality and hypernatremia: Principal | ICD-10-CM

## 2016-02-08 DIAGNOSIS — E876 Hypokalemia: Secondary | ICD-10-CM

## 2016-02-08 DIAGNOSIS — F0280 Dementia in other diseases classified elsewhere without behavioral disturbance: Secondary | ICD-10-CM

## 2016-02-08 DIAGNOSIS — G309 Alzheimer's disease, unspecified: Secondary | ICD-10-CM

## 2016-02-08 LAB — BASIC METABOLIC PANEL
BUN: 34 mg/dL — AB (ref 6–20)
BUN: 27 mg/dL — ABNORMAL HIGH (ref 6–20)
BUN: 22 mg/dL — AB (ref 6–20)
CALCIUM: 8.3 mg/dL — AB (ref 8.9–10.3)
CALCIUM: 7.7 mg/dL — AB (ref 8.9–10.3)
CO2: 28 mmol/L (ref 22–32)
CO2: 27 mmol/L (ref 22–32)
CO2: 26 mmol/L (ref 22–32)
CREATININE: 0.79 mg/dL (ref 0.44–1.00)
CREATININE: 0.79 mg/dL (ref 0.44–1.00)
Calcium: 8 mg/dL — ABNORMAL LOW (ref 8.9–10.3)
Chloride: >130 mmol/L (ref 101–111)
Chloride: >130 mmol/L (ref 101–111)
Chloride: >130 mmol/L (ref 101–111)
Creatinine, Ser: 1.03 mg/dL — ABNORMAL HIGH (ref 0.44–1.00)
GFR calc Af Amer: 53 mL/min — ABNORMAL LOW (ref >60)
GFR calc Af Amer: >60 mL/min (ref >60)
GFR calc Af Amer: >60 mL/min (ref >60)
GFR, EST NON AFRICAN AMERICAN: 46 mL/min — AB (ref >60)
GLUCOSE: 132 mg/dL — AB (ref 65–99)
GLUCOSE: 148 mg/dL — AB (ref 65–99)
GLUCOSE: 132 mg/dL — AB (ref 65–99)
POTASSIUM: 3 mmol/L — AB (ref 3.5–5.1)
POTASSIUM: 3.3 mmol/L — AB (ref 3.5–5.1)
Potassium: 3.1 mmol/L — ABNORMAL LOW (ref 3.5–5.1)
SODIUM: 169 mmol/L — AB (ref 135–145)
SODIUM: 162 mmol/L — AB (ref 135–145)
SODIUM: 162 mmol/L — AB (ref 135–145)

## 2016-02-08 LAB — CBC
HCT: 34.9 % — ABNORMAL LOW (ref 36.0–46.0)
Hemoglobin: 9.9 g/dL — ABNORMAL LOW (ref 12.0–15.0)
MCH: 26.5 pg (ref 26.0–34.0)
MCHC: 28.4 g/dL — AB (ref 30.0–36.0)
MCV: 93.3 fL (ref 78.0–100.0)
PLATELETS: 167 10*3/uL (ref 150–400)
RBC: 3.74 MIL/uL — ABNORMAL LOW (ref 3.87–5.11)
RDW: 15.9 % — AB (ref 11.5–15.5)
WBC: 8 10*3/uL (ref 4.0–10.5)

## 2016-02-08 LAB — PHOSPHORUS: PHOSPHORUS: 2.3 mg/dL — AB (ref 2.5–4.6)

## 2016-02-08 LAB — MAGNESIUM: MAGNESIUM: 2.5 mg/dL — AB (ref 1.7–2.4)

## 2016-02-08 MED ORDER — ENOXAPARIN SODIUM 30 MG/0.3ML ~~LOC~~ SOLN
20.0000 mg | SUBCUTANEOUS | Status: DC
  Administered 2016-02-08 – 2016-02-10 (×3): 20 mg via SUBCUTANEOUS
  Filled 2016-02-08 (×3): qty 0.3

## 2016-02-08 MED ORDER — POTASSIUM CL IN DEXTROSE 5% 20 MEQ/L IV SOLN
20.0000 meq | INTRAVENOUS | Status: DC
  Administered 2016-02-08 – 2016-02-11 (×7): 20 meq via INTRAVENOUS
  Filled 2016-02-08 (×7): qty 1000

## 2016-02-08 MED ORDER — POTASSIUM PHOSPHATE MONOBASIC 500 MG PO TABS
500.0000 mg | ORAL_TABLET | Freq: Three times a day (TID) | ORAL | Status: DC
Start: 2016-02-08 — End: 2016-02-09
  Administered 2016-02-09 (×2): 500 mg via ORAL
  Filled 2016-02-08 (×3): qty 1

## 2016-02-08 MED ORDER — SODIUM CHLORIDE 0.9 % IV BOLUS (SEPSIS)
1000.0000 mL | Freq: Once | INTRAVENOUS | Status: AC
  Administered 2016-02-08: 1000 mL via INTRAVENOUS

## 2016-02-08 MED ORDER — ENOXAPARIN SODIUM 30 MG/0.3ML ~~LOC~~ SOLN: 20.0000 mg | SUBCUTANEOUS | Status: DC

## 2016-02-08 NOTE — Progress Notes (Signed)
Initial Nutrition Assessment  DOCUMENTATION CODES:   Severe malnutrition in context of chronic illness, Underweight  INTERVENTION:  Mighty Shake II BID with lunch and dinner meals, each supplement provides 480-500 kcals and 20-23 grams of protein.   NUTRITION DIAGNOSIS:   Malnutrition (Severe) related to chronic illness (dementia) as evidenced by severe depletion of body fat, severe depletion of muscle mass.  GOAL:   Patient will meet greater than or equal to 90% of their needs  MONITOR:   PO intake, Supplement acceptance, Labs, Weight trends  REASON FOR ASSESSMENT:   Low Braden    ASSESSMENT:   83 year old female from [place] with PMHx of ischemic colitis, HTN, severe dementia/Alzheimer's dementia, presents with worsening mental status.    -Patient advanced to Dysphagia 1 diet with Nectar Thick liquids after SLP evaluation.   Patient nonverbal, unable to provide history. No family present at bedside. Spoke with RN from [employer]. She reports at baseline the patient is on a chopped diet with thin liquids. She can normally talk and react and is not so lethargic. Patient requires help with eating. RN was unsure if patient has had any weight loss recently. Last weight in chart was 112 lbs in 2013.   Medications reviewed and include: potassium phosphate 500 mg TID with meals, senna, D5 with KCl 20 mEq/L @ 100 ml/hr (120 grams dextrose, 408 kcal daily).   Labs reviewed: Sodium 162 (trending down), Potassium 3, Chloride >130, BUN 27.   Nutrition-Focused physical exam completed. Findings are severe fat depletion, severe muscle depletion, and no edema.   Discussed with RN.   Diet Order:  DIET - DYS 1 Room service appropriate? Yes; Fluid consistency: Nectar Thick  Skin:  Wound (see comment) (MSAD to sacrum)  Last BM:  02/08/2016  Height:   Ht Readings from Last 1 Encounters:  02/07/16 5\' 2"  (1.575 m)    Weight:   Wt Readings from Last 1 Encounters:  02/07/16  93 lb 4.1 oz (42.3 kg)    Ideal Body Weight:  50 kg  BMI:  Body mass index is 17.06 kg/m.  Estimated Nutritional Needs:   Kcal:  1065-1200 (HBE x 1.15-1.3)  Protein:  45-55 (1.1-1.3 grams/kg)  Fluid:  1 L/day (25 ml/kg)  EDUCATION NEEDS:   Education needs no appropriate at this time  Willey Blade, MS, RD, LDN Pager: [phone] After Hours Pager: [phone]

## 2016-02-08 NOTE — Progress Notes (Signed)
Call received from lab with sodium of 169 and chloride of greater than 130. MD notified per protocol. VWilliams,rn.

## 2016-02-08 NOTE — Clinical Social Work Note (Signed)
Clinical Social Work Assessment  Patient Details  Name: Stacey Huang MRN: [id number] Date of Birth: Jun 08, 1922  Date of referral:  02/08/16               Reason for consult:  Facility Placement                Permission sought to share information with:  Family Supports Permission granted to share information::     Name::     [place]::  [facility]  Relationship::  Son  Contact Information:  [phone]  Housing/Transportation Living arrangements for the past 2 months:  [facility] (Memory Care) Source of Information:  Adult Children Patient Interpreter Needed:  None Criminal Activity/Legal Involvement Pertinent to Current Situation/Hospitalization:    Significant Relationships:  None Lives with:  Self Do you feel safe going back to the place where you live?  No Need for family participation in patient care:  Yes (Comment)  Care giving concerns: Facility and family are concerned about patient ability to continue care at [facility]isted [facility] Facility([facility]  Social Worker assessment / plan:  LCSWA gathered clinical information from visiting RN-Jennifer from [facility]. She reports the patient is apart of [facility] care. Patient will have to be screened to verify readmission. LCSWA informed RN-Jennifer PT/OT notes are pending for placement.  LCSWA spoke with patient Son-Ronnie over phone. LCSWA informed him that RN from [facility] came to visit patient and will determine if patient is able to return to ALF-Memory care. He reports understanding.   Plan: Determine patient level of care PT/OT and assist with disposition.   Employment status:  Retired Forensic scientist:  Medicare PT Recommendations:  Not assessed at this time Information / Referral to community resources:     Patient/Family's Response to care:  Patient son reports they are interested in Palliative following patient in the  community. Patient was set up with Palliative previously and Son reports patient was able to keep up her weight and did well under their services and care.   Patient/Family's Understanding of and Emotional Response to Diagnosis, Current Treatment, and Prognosis:  Patient Son reports understanding the patient may need SNF placement before returning toALF. He reports the patient has been in a similar situation before and family prefers [facility].   Emotional Assessment Appearance:    Attitude/Demeanor/Rapport:    Affect (typically observed):    Orientation:  Oriented to Self Alcohol / Substance use:  Not Applicable Psych involvement (Current and /or in the community):  No (Comment)  Discharge Needs  Concerns to be addressed:  Discharge Planning Concerns, Care Coordination Readmission within the last 30 days:  No Current discharge risk:  Dependent with Mobility Barriers to Discharge:  Continued Medical Work up   [employer], LCSW 02/08/2016, 10:10 AM

## 2016-02-08 NOTE — Evaluation (Signed)
Clinical/Bedside Swallow Evaluation Patient Details  Name: Stacey Huang MRN: [id number] Date of Birth: [birth year]  Today's Date: 02/08/2016 Time: SLP Start Time (ACUTE ONLY): 1055 SLP Stop Time (ACUTE ONLY): 1125 SLP Time Calculation (min) (ACUTE ONLY): 30 min  Past Medical History:  Past Medical History:  Diagnosis Date  . Alzheimer disease   . Atrial fibrillation ([place])   . Compression fracture of T11 vertebra   . Dementia   . E-coli UTI   . Gastro - esophageal reflux disease   . Hypokalemia   . Ischemic colitis, enteritis, or enterocolitis ([place])   . MGUS (monoclonal gammopathy of unknown significance)   . Nasal septal hematoma   . Shoulder fracture, left    Past Surgical History:  Past Surgical History:  Procedure Laterality Date  . ESOPHAGOGASTRODUODENOSCOPY  11/10/2010   Procedure: ESOPHAGOGASTRODUODENOSCOPY (EGD);  Surgeon: Beryle Beams;  Location: [facility];  Service: Endoscopy;  Laterality: N/A;  . FLEXIBLE SIGMOIDOSCOPY  11/10/2010   Procedure: FLEXIBLE SIGMOIDOSCOPY;  Surgeon: Beryle Beams;  Location: [facility];  Service: Gastroenterology;  Laterality: N/A;  . unknown     HPI:  83 year old female admitted 02/07/16 due to AMS, dehydration, poor po intake. PMH significant for ischemic colitis, severe dementia, afib, GERD, CKD3.  BSE ordered to evaluate swallow function and safety, and to identify least restrictive diet.   Assessment / Plan / Recommendation Clinical Impression  Oral care completed with suction, which pt appeared to enjoy. Oral cavity was noted to be dry and cracked prior to oral care. Moisturizer applied to lips. Pt was difficult to position upright, due to contractures. PO trials of thin liquid, nectar thick, puree, and cracker were presented. Immediate cough response was noted following trial of thin liquid, and pt was unable to demonstrate ability to take cracker orally and chew it. Nectar thick liquid and puree consistencies appeared to be  tolerated without overt s/s aspiration. Recommend beginning Dys 1 (puree) diet and nectar thick liquids WHEN FULLY ALERT, crush meds, oral care before AND after po intake due to increased aspiration risk. ST will follow for diet tolerance assessment and education. RN and MD informed of results and recommendations. Precautions posted in pt room.    Aspiration Risk  Moderate aspiration risk    Diet Recommendation Nectar-thick liquid;Dysphagia 1 (Puree)   Liquid Administration via: Straw;Cup Medication Administration: Crushed with puree Supervision: Full supervision/cueing for compensatory strategies;Staff to assist with self feeding Compensations: Minimize environmental distractions;Slow rate;Small sips/bites Postural Changes: Seated upright at 90 degrees;Remain upright for at least 30 minutes after po intake    Other  Recommendations Oral Care Recommendations: Oral care before and after PO Other Recommendations: Have oral suction available;Order thickener from pharmacy   Follow up Recommendations 24 hour supervision/assistance;Skilled Nursing facility      Frequency and Duration min 2x/week  2 weeks       Prognosis Prognosis for Safe Diet Advancement: Guarded Barriers to Reach Goals: Cognitive deficits      Swallow Study   General Date of Onset: 02/07/16 HPI: 83 year old female admitted 02/07/16 due to AMS, dehydration, poor po intake. PMH significant for ischemic colitis, severe dementia, afib, GERD, CKD3.  BSE ordered to evaluate swallow function and safety, and to identify least restrictive diet. Type of Study: Bedside Swallow Evaluation Previous Swallow Assessment: none Diet Prior to this Study: NPO Temperature Spikes Noted: No Respiratory Status: Nasal cannula History of Recent Intubation: No Behavior/Cognition: Cooperative;Alert;Doesn't follow directions;Requires cueing;Confused;Pleasant mood Oral Cavity Assessment: Dry Oral Care  Completed by SLP: Yes Oral Cavity -  Dentition: Adequate natural dentition Self-Feeding Abilities: Total assist Patient Positioning: Upright in bed Baseline Vocal Quality: Normal Volitional Cough: Cognitively unable to elicit Volitional Swallow: Unable to elicit    Oral/Motor/Sensory Function Overall Oral Motor/Sensory Function: Within functional limits   Ice Chips Ice chips: Within functional limits Presentation: Spoon   Thin Liquid Thin Liquid: Impaired Presentation: Straw Pharyngeal  Phase Impairments: Suspected delayed Swallow;Cough - Immediate    Nectar Thick Nectar Thick Liquid: Within functional limits Presentation: Cup;Spoon   Honey Thick Honey Thick Liquid: Not tested   Puree Puree: Within functional limits Presentation: Spoon   Solid    Solid: Impaired Other Comments: pt unable to demonstrate ability to take cracker and chew it       Stacey Huang B. Quentin Ore [facility], [place] [phone]  Shonna Chock 02/08/2016,11:32 AM

## 2016-02-08 NOTE — Progress Notes (Signed)
CRITICAL VALUE ALERT  Critical value received:  NA+ 162, CL >130  Date of notification:  02/08/16  Time of notification:  [id number]  Critical value read back:yes  Nurse who received alert: Harvest Dark  MD notified (1st page):  Dr. Jerilynn Mages. Short  Time of first page:  1425  Time MD responded:  [id number]

## 2016-02-08 NOTE — Progress Notes (Signed)
Bladder scanned 236 cc, MD notified.

## 2016-02-08 NOTE — Progress Notes (Signed)
PROGRESS NOTE  Stacey Huang  [id number] DOB: Jan 06, 1922 DOA: 02/07/2016 PCP: Stacey Brothers, MD  Brief Narrative:   Stacey Huang  is a 83 y.o. female from SNF with past medical history of ischemic colitis, hypertension, severe dementia/Alzheimer's dementia, atrial fibrillation not on anticoagulation secondary to falls who was brought to ED for worsening mental status.  At baseline patient is nonverbal, total assist, she mumbles occasionally, but she was less responsive.  No documented of fever, diarrhea, vomiting, cough.  In ED patient had negative urinalysis, chest x-ray with no acute finding, but she was severely clinically dehydrated, with a sodium of 176, potassium of 2.7, no leukocytosis.  Sodium remained elevated despite hydration with half normal saline for the last day and she has only voided once.  Have ordered additional bolus and changed fluids to D5W today.  More alert today so SLP evaluation ordered and diet order placed.    Assessment & Plan:   Active Problems:   Atrial fibrillation (HCC)   Gastroesophageal reflux disease   Dementia   Hypokalemia   History of fall   Alzheimer's dementia   Hypernatremia   CKD (chronic kidney disease) stage 3, GFR 30-59 ml/min  Acute encephalopathy due to dehydration and hypernatremia secondary to progressive severe dementia, slightly more alert today  -  CT head without acute change -  SLP:  Advanced diet to dysphagia 1 with nectar thick -  If not drinking much, will change to allow free water  Hypernatremia, marginally improved since yesterday 171 > 169 over 24 hours -  Will correct her hypernatremia slowly, 10 mmol daily -  Change to D5W with potassium -  NS bolus due to low urine output  Magnesium elevated  Mild hypophosphatemia -  Oral potassium phosphate repletin -  Repeat phos in AM  Hypokalemia - continue potassium in IV fluids - small amount of additional oral potassium  Acute on CKD stage III, creatinine trending  down from 1.2 to 0.79 with IVF - continue to hold lasix  Alzheimer's dementia - Resume Remeron were mentation back to baseline  Hypertension - Continue with home medication  Atrial fibrillation - As per reviewing records, she's not anticoagulation candidate due to multiple falls and history of GI bleed - Continue aspirin, continue with Cardizem and metoprolol for heart rate control  DVT prophylaxis:  lovenox Code Status:  DNR Family Communication:  Patient alone Disposition Plan:  Pending improvement in sodium, tolerating diet.  Likely back to SNF.  Recommend palliative care or hospice support at SNF.    Consultants:   none  Procedures:  none  Antimicrobials:  Anti-infectives    None       Subjective: Confused, severe dementia.  jibberish.  Objective: Vitals:   02/07/16 2026 02/08/16 0447 02/08/16 1218 02/08/16 1449  BP: (!) 117/96 129/89  95/76  Pulse: 89 80 86 70  Resp: 18 19  18   Temp: 98.5 F (36.9 C) 97.2 F (36.2 C)  97.4 F (36.3 C)  TempSrc: Oral Axillary  Axillary  SpO2: 100% 96%  92%  Weight:      Height:        Intake/Output Summary (Last 24 hours) at 02/08/16 1459 Last data filed at 02/08/16 1446  Gross per 24 hour  Intake             1450 ml  Output                1 ml  Net  1449 ml   Filed Weights   02/07/16 1625  Weight: 42.3 kg (93 lb 4.1 oz)    Examination:  General exam:  Adult female, cachectic lying in fetal position left side.  No acute distress.  HEENT:  NCAT, sunken eyes, dry MM Respiratory system: Clear to auscultation bilaterally Cardiovascular system: Regular rate and rhythm, normal S1/S2. No murmurs, rubs, gallops or clicks.  Warm extremities Gastrointestinal system: Normal active bowel sounds, soft, nondistended, nontender. MSK:  Normal tone and bulk, no lower extremity edema Neuro:  Grossly moves all extremities    Data Reviewed: I have personally reviewed following labs and imaging  studies  CBC:  Recent Labs Lab 02/07/16 0753 02/07/16 1059 02/08/16 0605  WBC 8.5  --  8.0  NEUTROABS 5.6  --   --   HGB 10.3* 11.2* 9.9*  HCT 34.7* 33.0* 34.9*  MCV 94.3  --  93.3  PLT 197  --  [id number]   Basic Metabolic Panel:  Recent Labs Lab 02/07/16 0753 02/07/16 1059 02/08/16 0605 02/08/16 1333  NA 171* 176* 169* 162*  K 3.0* 2.7* 3.1* 3.0*  CL >130* 134* >130* >130*  CO2 31  --  28 27  GLUCOSE 102* 94 132* 148*  BUN 44* 39* 34* 27*  CREATININE 1.11* 1.20* 1.03* 0.79  CALCIUM 8.7*  --  8.3* 7.7*  MG  --   --  2.5*  --   PHOS  --   --  2.3*  --    GFR: Estimated Creatinine Clearance: 30.6 mL/min (by C-G formula based on SCr of 0.79 mg/dL). Liver Function Tests:  Recent Labs Lab 02/07/16 0753  AST 25  ALT 22  ALKPHOS 65  BILITOT 0.2*  PROT 6.8  ALBUMIN 3.3*   No results for input(s): LIPASE, AMYLASE in the last 168 hours. No results for input(s): AMMONIA in the last 168 hours. Coagulation Profile: No results for input(s): INR, PROTIME in the last 168 hours. Cardiac Enzymes: No results for input(s): CKTOTAL, CKMB, CKMBINDEX, TROPONINI in the last 168 hours. BNP (last 3 results) No results for input(s): PROBNP in the last 8760 hours. HbA1C: No results for input(s): HGBA1C in the last 72 hours. CBG: No results for input(s): GLUCAP in the last 168 hours. Lipid Profile: No results for input(s): CHOL, HDL, LDLCALC, TRIG, CHOLHDL, LDLDIRECT in the last 72 hours. Thyroid Function Tests: No results for input(s): TSH, T4TOTAL, FREET4, T3FREE, THYROIDAB in the last 72 hours. Anemia Panel: No results for input(s): VITAMINB12, FOLATE, FERRITIN, TIBC, IRON, RETICCTPCT in the last 72 hours. Urine analysis:    Component Value Date/Time   COLORURINE YELLOW 02/07/2016 0807   APPEARANCEUR CLEAR 02/07/2016 0807   LABSPEC 1.026 02/07/2016 0807   PHURINE 5.0 02/07/2016 0807   GLUCOSEU NEGATIVE 02/07/2016 0807   HGBUR NEGATIVE 02/07/2016 0807   BILIRUBINUR NEGATIVE  02/07/2016 0807   KETONESUR NEGATIVE 02/07/2016 0807   PROTEINUR NEGATIVE 02/07/2016 0807   UROBILINOGEN 0.2 12/01/2011 1130   NITRITE NEGATIVE 02/07/2016 0807   LEUKOCYTESUR NEGATIVE 02/07/2016 0807   Sepsis Labs: @LABRCNTIP (procalcitonin:4,lacticidven:4)  )No results found for this or any previous visit (from the past 240 hour(s)).    Radiology Studies: Ct Head Wo Contrast  Result Date: 02/07/2016 CLINICAL DATA:  Acute encephalopathy. EXAM: CT HEAD WITHOUT CONTRAST TECHNIQUE: Contiguous axial images were obtained from the base of the skull through the vertex without intravenous contrast. COMPARISON:  07/30/2013 FINDINGS: Brain: No acute infarct, hemorrhage, hydrocephalus, or mass. There may be small hygromas about the cerebellum given  the prominent extra-axial CSF without folia widening. Cerebral atrophy is severe, especially in the peri-insular and mesial temporal region, correlating with history of Alzheimer's disease. Asymmetric greater prior ischemic injury to the left cerebral hemisphere. Vascular: Atherosclerotic calcification is mild for age. No hyperdense vessel. Skull: No acute finding Sinuses/Orbits: Negative IMPRESSION: No acute finding. Advanced atrophy in keeping with history of Alzheimer's. Electronically Signed   By: Monte Fantasia M.D.   On: 02/07/2016 13:35   Dg Chest Port 1 View  Result Date: 02/07/2016 CLINICAL DATA:  Altered mental status EXAM: PORTABLE CHEST 1 VIEW COMPARISON:  12/01/2011 FINDINGS: Chronic cardiomegaly. Negative mediastinal contours. Interstitial coarsening greater at the left base where there is also volume loss, compatible with atelectasis. No air bronchogram, Kerley line, effusion, or pneumothorax. Osteopenia. Remote proximal left humerus fracture. IMPRESSION: 1. Mild atelectasis at the left base. 2. Chronic cardiomegaly.  No pulmonary edema. Electronically Signed   By: Monte Fantasia M.D.   On: 02/07/2016 07:25     Scheduled Meds: . aspirin  81 mg  Oral Daily  . digoxin  0.0625 mg Oral Daily  . diltiazem  60 mg Oral Q6H  . heparin  5,000 Units Subcutaneous Q8H  . metoprolol tartrate  12.5 mg Oral BID  . OCUSOFT LID SCRUB  1 each Apply externally Daily  . potassium phosphate (monobasic)  500 mg Oral TID WC  . senna-docusate  1 tablet Oral Daily   Continuous Infusions: . dextrose 5 % with KCl 20 mEq / L 20 mEq (02/08/16 0937)     LOS: 1 day    Time spent: 30 min    Janece Canterbury, MD [facility] Pager [phone]  If 7PM-7AM, please contact night-coverage [email or web address] Password [email or web address] 02/08/2016, 2:59 PM

## 2016-02-08 NOTE — Progress Notes (Signed)
Pharmacy - Lovenox   Assessment: Pharmacy consulted to dose lovenox for VTE prophylaxis in this 81 yoF admitted for AMS.  PMH AFib not on anticoag, Hx GIB, falls, dementia.   Wt 42 kg  CrCl 31 ml/min  Previously on SQ heparin, last dose today at 1455  Also on ASA 81 mg daily   Plan:  Lovenox 20 mg SQ q24 hr  Using lower than normal dose d/t advanced age, low weight, poor renal function, and other bleeding risk factors (30 mg daily would be close to treatment dosing)  Reuel Boom, PharmD, BCPS Pager: [phone] 02/08/2016, 3:46 PM

## 2016-02-09 ENCOUNTER — Inpatient Hospital Stay (HOSPITAL_COMMUNITY): Payer: Medicare Other

## 2016-02-09 DIAGNOSIS — I959 Hypotension, unspecified: Secondary | ICD-10-CM

## 2016-02-09 DIAGNOSIS — G9340 Encephalopathy, unspecified: Secondary | ICD-10-CM

## 2016-02-09 DIAGNOSIS — R4182 Altered mental status, unspecified: Secondary | ICD-10-CM

## 2016-02-09 LAB — BASIC METABOLIC PANEL
Anion gap: 7 (ref 5–15)
Anion gap: 6 (ref 5–15)
Anion gap: 3 — ABNORMAL LOW (ref 5–15)
BUN: 17 mg/dL (ref 6–20)
BUN: 14 mg/dL (ref 6–20)
BUN: 9 mg/dL (ref 6–20)
CALCIUM: 8 mg/dL — AB (ref 8.9–10.3)
CALCIUM: 7.4 mg/dL — AB (ref 8.9–10.3)
CHLORIDE: 125 mmol/L — AB (ref 101–111)
CO2: 24 mmol/L (ref 22–32)
CO2: 24 mmol/L (ref 22–32)
CO2: 22 mmol/L (ref 22–32)
CREATININE: 0.71 mg/dL (ref 0.44–1.00)
CREATININE: 0.53 mg/dL (ref 0.44–1.00)
Calcium: 8 mg/dL — ABNORMAL LOW (ref 8.9–10.3)
Chloride: 124 mmol/L — ABNORMAL HIGH (ref 101–111)
Chloride: 124 mmol/L — ABNORMAL HIGH (ref 101–111)
Creatinine, Ser: 0.69 mg/dL (ref 0.44–1.00)
GFR calc Af Amer: >60 mL/min (ref >60)
GFR calc Af Amer: >60 mL/min (ref >60)
GFR calc Af Amer: >60 mL/min (ref >60)
GFR calc non Af Amer: >60 mL/min (ref >60)
GFR calc non Af Amer: >60 mL/min (ref >60)
GLUCOSE: 125 mg/dL — AB (ref 65–99)
GLUCOSE: 124 mg/dL — AB (ref 65–99)
GLUCOSE: 76 mg/dL (ref 65–99)
POTASSIUM: 3 mmol/L — AB (ref 3.5–5.1)
Potassium: 3.5 mmol/L (ref 3.5–5.1)
Potassium: 3.9 mmol/L (ref 3.5–5.1)
Sodium: 156 mmol/L — ABNORMAL HIGH (ref 135–145)
Sodium: 154 mmol/L — ABNORMAL HIGH (ref 135–145)
Sodium: 149 mmol/L — ABNORMAL HIGH (ref 135–145)

## 2016-02-09 LAB — BLOOD GAS, ARTERIAL
ACID-BASE DEFICIT: 1.8 mmol/L (ref 0.0–2.0)
BICARBONATE: 21.5 mmol/L (ref 20.0–28.0)
Drawn by: 103701
O2 Content: 5 L/min
O2 Saturation: 98.9 %
PH ART: 7.435 (ref 7.350–7.450)
Patient temperature: 98.6
pCO2 arterial: 32.6 mmHg (ref 32.0–48.0)
pO2, Arterial: 173 mmHg — ABNORMAL HIGH (ref 83.0–108.0)

## 2016-02-09 LAB — DIGOXIN LEVEL: DIGOXIN LVL: 0.2 ng/mL — AB (ref 0.8–2.0)

## 2016-02-09 LAB — CBC WITH DIFFERENTIAL/PLATELET
BASOS ABS: 0 10*3/uL (ref 0.0–0.1)
BASOS PCT: 0 %
EOS ABS: 0.2 10*3/uL (ref 0.0–0.7)
EOS PCT: 2 %
HCT: 30.2 % — ABNORMAL LOW (ref 36.0–46.0)
HEMOGLOBIN: 8.9 g/dL — AB (ref 12.0–15.0)
Lymphocytes Relative: 22 %
Lymphs Abs: 2 10*3/uL (ref 0.7–4.0)
MCH: 26.6 pg (ref 26.0–34.0)
MCHC: 29.5 g/dL — ABNORMAL LOW (ref 30.0–36.0)
MCV: 90.1 fL (ref 78.0–100.0)
Monocytes Absolute: 0.4 10*3/uL (ref 0.1–1.0)
Monocytes Relative: 4 %
NEUTROS PCT: 72 %
Neutro Abs: 6.5 10*3/uL (ref 1.7–7.7)
PLATELETS: 143 10*3/uL — AB (ref 150–400)
RBC: 3.35 MIL/uL — AB (ref 3.87–5.11)
RDW: 15.1 % (ref 11.5–15.5)
WBC: 9.1 10*3/uL (ref 4.0–10.5)

## 2016-02-09 LAB — COMPREHENSIVE METABOLIC PANEL
ALK PHOS: 63 U/L (ref 38–126)
ALT: 21 U/L (ref 14–54)
AST: 26 U/L (ref 15–41)
Albumin: 2.8 g/dL — ABNORMAL LOW (ref 3.5–5.0)
Anion gap: 5 (ref 5–15)
BILIRUBIN TOTAL: 0.5 mg/dL (ref 0.3–1.2)
BUN: 13 mg/dL (ref 6–20)
CALCIUM: 7.9 mg/dL — AB (ref 8.9–10.3)
CO2: 24 mmol/L (ref 22–32)
Chloride: 123 mmol/L — ABNORMAL HIGH (ref 101–111)
Creatinine, Ser: 0.67 mg/dL (ref 0.44–1.00)
Glucose, Bld: 114 mg/dL — ABNORMAL HIGH (ref 65–99)
Potassium: 3.9 mmol/L (ref 3.5–5.1)
Sodium: 152 mmol/L — ABNORMAL HIGH (ref 135–145)
TOTAL PROTEIN: 6 g/dL — AB (ref 6.5–8.1)

## 2016-02-09 LAB — CBC
HEMATOCRIT: 32.1 % — AB (ref 36.0–46.0)
Hemoglobin: 9.4 g/dL — ABNORMAL LOW (ref 12.0–15.0)
MCH: 27.5 pg (ref 26.0–34.0)
MCHC: 29.3 g/dL — AB (ref 30.0–36.0)
MCV: 93.9 fL (ref 78.0–100.0)
Platelets: 145 10*3/uL — ABNORMAL LOW (ref 150–400)
RBC: 3.42 MIL/uL — ABNORMAL LOW (ref 3.87–5.11)
RDW: 15.7 % — AB (ref 11.5–15.5)
WBC: 10.2 10*3/uL (ref 4.0–10.5)

## 2016-02-09 LAB — PHOSPHORUS: PHOSPHORUS: 2 mg/dL — AB (ref 2.5–4.6)

## 2016-02-09 LAB — LACTIC ACID, PLASMA
LACTIC ACID, VENOUS: 2.6 mmol/L — AB (ref 0.5–1.9)
Lactic Acid, Venous: 1.4 mmol/L (ref 0.5–1.9)

## 2016-02-09 LAB — MAGNESIUM: Magnesium: 2 mg/dL (ref 1.7–2.4)

## 2016-02-09 LAB — TROPONIN I
TROPONIN I: 0.04 ng/mL — AB (ref <0.03)
TROPONIN I: 0.03 ng/mL — AB (ref <0.03)

## 2016-02-09 LAB — PROCALCITONIN: Procalcitonin: <0.1 ng/mL

## 2016-02-09 MED ORDER — PIPERACILLIN-TAZOBACTAM 3.375 G IVPB
3.3750 g | Freq: Three times a day (TID) | INTRAVENOUS | Status: DC
  Administered 2016-02-10 – 2016-02-11 (×4): 3.375 g via INTRAVENOUS
  Filled 2016-02-09 (×4): qty 50

## 2016-02-09 MED ORDER — VANCOMYCIN HCL IN DEXTROSE 1-5 GM/200ML-% IV SOLN
1000.0000 mg | Freq: Once | INTRAVENOUS | Status: AC
  Administered 2016-02-09: 1000 mg via INTRAVENOUS

## 2016-02-09 MED ORDER — VANCOMYCIN HCL IN DEXTROSE 750-5 MG/150ML-% IV SOLN
750.0000 mg | INTRAVENOUS | Status: DC
  Administered 2016-02-10: 750 mg via INTRAVENOUS
  Filled 2016-02-09: qty 150

## 2016-02-09 MED ORDER — SODIUM CHLORIDE 0.9 % IV SOLN
30.0000 meq | Freq: Once | INTRAVENOUS | Status: AC
  Administered 2016-02-09: 30 meq via INTRAVENOUS
  Filled 2016-02-09: qty 15

## 2016-02-09 MED ORDER — IPRATROPIUM-ALBUTEROL 0.5-2.5 (3) MG/3ML IN SOLN
3.0000 mL | Freq: Once | RESPIRATORY_TRACT | Status: AC
  Administered 2016-02-09: 3 mL via RESPIRATORY_TRACT
  Filled 2016-02-09: qty 3

## 2016-02-09 MED ORDER — SODIUM CHLORIDE 0.9 % IV BOLUS (SEPSIS)
2000.0000 mL | Freq: Once | INTRAVENOUS | Status: AC
  Administered 2016-02-09: 2000 mL via INTRAVENOUS

## 2016-02-09 MED ORDER — PIPERACILLIN-TAZOBACTAM 3.375 G IVPB 30 MIN
3.3750 g | Freq: Once | INTRAVENOUS | Status: AC
  Administered 2016-02-09: 3.375 g via INTRAVENOUS
  Filled 2016-02-09: qty 50

## 2016-02-09 MED ORDER — SODIUM CHLORIDE 0.9 % IV BOLUS (SEPSIS)
1000.0000 mL | Freq: Once | INTRAVENOUS | Status: AC
Start: 2016-02-09 — End: 2016-02-09
  Administered 2016-02-09: 1000 mL via INTRAVENOUS

## 2016-02-09 MED ORDER — POTASSIUM CHLORIDE 2 MEQ/ML IV SOLN
30.0000 meq | Freq: Once | INTRAVENOUS | Status: AC
  Administered 2016-02-09: 30 meq via INTRAVENOUS
  Filled 2016-02-09: qty 15

## 2016-02-09 NOTE — Progress Notes (Signed)
Pharmacy Antibiotic Note  Stacey Huang is a 83 y.o. female presenred to the ED on 02/07/16 from SNF with c/o AMS.  She became hypotensive and hypoxic this evening.  To start vancomycin and zosyn for suspected sepsis.   Plan: - Zosyn 3.375 gm IV x1 over 30 min, then 3.375 gm IV q8h (infuse over 4 hours) - Vancomycin 1000 mg IV x 1, then 750 mg IV q24h  ______________________________________  Height: 5\' 2"  (157.5 cm) Weight: 93 lb 4.1 oz (42.3 kg) IBW/kg (Calculated) : 50.1  Temp (24hrs), Avg:97.8 F (36.6 C), Min:97.5 F (36.4 C), Max:98.2 F (36.8 C)   Recent Labs Lab 02/07/16 0753  02/08/16 0605 02/08/16 1333 02/08/16 2124 02/09/16 0536 02/09/16 1253  WBC 8.5  --  8.0  --   --  10.2  --   CREATININE 1.11*  < > 1.03* 0.79 0.79 0.69 0.71  < > = values in this interval not displayed.  Estimated Creatinine Clearance: 30.6 mL/min (by C-G formula based on SCr of 0.71 mg/dL).    No Known Allergies   Thank you for allowing pharmacy to be a part of this patient's care.  Lynelle Doctor 02/09/2016 5:49 PM

## 2016-02-09 NOTE — Progress Notes (Signed)
Speech Language Pathology Treatment: Dysphagia  Patient Details Name: Stacey Huang MRN: [id number] DOB: [birth year] Today's Date: 02/09/2016 Time: [id number] SLP Time Calculation (min) (ACUTE ONLY): 8 min  Assessment / Plan / Recommendation Clinical Impression  Pt was alert but not following commands throughout session. She consumed nectar thick liquids and pureed solids with adequate automaticity for oral acceptance and swallow initiation, but with trace-mild oral residue that intermittently spills from her oral cavity seemingly without awareness. No overt s/s of aspiration are observed. Recommend to continue with current diet for now.    HPI HPI: 83 year old female admitted 02/07/16 due to AMS, dehydration, poor po intake. PMH significant for ischemic colitis, severe dementia, afib, GERD, CKD3.  BSE ordered to evaluate swallow function and safety, and to identify least restrictive diet.      SLP Plan  Continue with current plan of care     Recommendations  Diet recommendations: Dysphagia 1 (puree);Nectar-thick liquid Liquids provided via: Straw Medication Administration: Crushed with puree Supervision: Staff to assist with self feeding;Full supervision/cueing for compensatory strategies Compensations: Minimize environmental distractions;Slow rate;Small sips/bites Postural Changes and/or Swallow Maneuvers: Seated upright 90 degrees;Upright 30-60 min after meal                Oral Care Recommendations: Oral care before and after PO Follow up Recommendations: 24 hour supervision/assistance;Skilled Nursing facility Plan: Continue with current plan of care       GO                Germain Osgood 02/09/2016, 2:04 PM  Germain Osgood, M.A. CCC-SLP [phone]

## 2016-02-09 NOTE — Progress Notes (Signed)
Attempted to wean O2. Sats dropped decreased to 78% on 2L via N/C. Oxygen increased to back to 5L via N/C. Sats increased to 91-92%.

## 2016-02-09 NOTE — Progress Notes (Signed)
PT Cancellation Note  Patient Details Name: Stacey Huang MRN: [id number] DOB: May 20, 1922   Cancelled Treatment:    Reason Eval/Treat Not Completed: PT screened, no needs identified, will sign off. Attempted PT eval. Pt found in fetal position. Pt does not follow commands. She is unable to participate meaningfully with therapy. She is not appropriate for PT services. Will sign off.    Weston Anna, MPT Pager: [phone]

## 2016-02-09 NOTE — Progress Notes (Addendum)
Called to bedside due to low oxygen levels. Per nurse, the patient had been on 5 L nasal cannula and oxygen saturations on her finger, for head, and ear head decreased to the low 80s. The patient did not appear to be in respiratory distress. I came to the room to evaluate the patient, and she was lying comfortably in bed on 5 L nasal cannula. She was not tachypneic.  She had diminished breath sounds at the right base and right mid back but she was also contorted in bed and has severe kyphosis. Her lungs are otherwise clear to auscultation bilaterally. Because she looked clinically well, alert, and without respiratory distress, we checked an ABG which demonstrated that her O2 saturations were actually 99% with a PaO2 over 100 on 5 L nasal cannula.  Her chest x-ray which was done this morning demonstrated only some atelectasis, but given her clinical change I wanted to exclude pneumonia. Pro-calcitonin was done which was negative.    Called to the bedside again because of hypotension this afternoon. Her blood pressures dropped to the [id number] systolic. According to the nurse she has been having frequent soft bowel movements all afternoon. She had received a senna-docusate this morning.  She had otherwise had no other changes in her symptoms or clinical status.    Requested a 2 L normal saline bolus.  CMP, CBC with differential, lactic acid, troponin, blood culture  EKG: Atrial fibrillation with a rate of 10 5 bpm. She has some T-wave inversions in leads 2 and 3 which appeared to be similar to prior. The flattening of her T waves in all other leads appears to be similar to her EKG in August of last year.  We will attempt resuscitation with IV fluids and give antibiotics for possible sepsis of unknown etiology, however if this is unsuccessful, the plan will be to discuss the option of comfort measures with the family.

## 2016-02-09 NOTE — Progress Notes (Signed)
Pt B/P 73/44. MD was made aware of low BP. 2 L bolus was ordered and is being infused. Stat EKG was given and MD is aware of results. Pt currently on 4 L [place] and saturations are in the mid 80's. RN/NT will check repeat BP at 1910. Pt currently stable.  No questions or concerns at this time.  Yasiel Goyne W Giavonni Cizek, RN

## 2016-02-09 NOTE — Progress Notes (Signed)
PROGRESS NOTE  Stacey Huang  [id number] DOB: 12-Mar-1922 DOA: 02/07/2016 PCP: Sande Brothers, MD  Brief Narrative:   Stacey Huang  is a 83 y.o. female from SNF with past medical history of ischemic colitis, hypertension, severe dementia/Alzheimer's dementia, atrial fibrillation not on anticoagulation secondary to falls who was brought to ED for worsening mental status.  At baseline patient is nonverbal, total assist, she mumbles occasionally, but she was less responsive.  No documented of fever, diarrhea, vomiting, cough.  In ED patient had negative urinalysis, chest x-ray with no acute finding, but she was severely clinically dehydrated, with a sodium of 176, potassium of 2.7, no leukocytosis.  Sodium remained elevated despite hydration with half normal saline for the last day and she has only voided once.  Have ordered additional bolus and changed fluids to D5W today.  More alert today so SLP evaluation ordered and diet order placed.    Assessment & Plan:   Active Problems:   Atrial fibrillation (HCC)   Gastroesophageal reflux disease   Dementia   Hypokalemia   History of fall   Alzheimer's dementia   Hypernatremia   CKD (chronic kidney disease) stage 3, GFR 30-59 ml/min  Acute encephalopathy due to dehydration and hypernatremia secondary to progressive severe dementia, slightly more alert today  -  CT head without acute change -  SLP:  Advanced diet to dysphagia 1 with nectar thick -  If not drinking much, will change to allow free water  Acute hypoxic respiratory failure, decreasing O2 sat today, but does not appear to be in respiratory distress -  No fever or elevated WBC to suggest pneumonia -  Repeat CXR:  Possible atelectasis at the right base -  Check ABG -  Trial of duoneb -   Increase to nonrebreather if necessary -  Check procalcitonin  Hypernatremia, gradually improving -  Will correct her hypernatremia slowly, 10 mmol daily -  Change to D5W with potassium -  NS  bolus due to low urine output  Magnesium elevated  Mild hypophosphatemia -  Oral potassium phosphate repletin -  Repeat phos in AM  Hypokalemia - continue potassium in IV fluids - give additional IV potassium  Acute on CKD stage III, creatinine trending down from 1.2 to 0.71 with IVF - continue to hold lasix  Alzheimer's dementia - Resume Remeron were mentation back to baseline  Hypertension - Continue with home medication  Atrial fibrillation - As per reviewing records, she's not anticoagulation candidate due to multiple falls and history of GI bleed - Continue aspirin, continue with Cardizem and metoprolol for heart rate control  DVT prophylaxis:  lovenox Code Status:  DNR Family Communication:  Patient alone Disposition Plan:  Disposition unclear.  Recommend palliative care or hospice support at SNF.     Consultants:   none  Procedures:  none  Antimicrobials:  Anti-infectives    None       Subjective: Confused, severe dementia.  Asks how I am today.  Wants eggs.    Objective: Vitals:   02/09/16 0519 02/09/16 1055 02/09/16 1241 02/09/16 1341  BP: (!) 99/54 109/68 102/62 (!) 95/55  Pulse: 81 (!) 56  (!) 52  Resp: 18   18  Temp: 98.2 F (36.8 C)   97.7 F (36.5 C)  TempSrc: Axillary   Oral  SpO2: 96%     Weight:      Height:        Intake/Output Summary (Last 24 hours) at 02/09/16 1431 Last data filed at  02/09/16 0600  Gross per 24 hour  Intake          2068.33 ml  Output                0 ml  Net          2068.33 ml   Filed Weights   02/07/16 1625  Weight: 42.3 kg (93 lb 4.1 oz)    Examination:  General exam:  Adult female, cachectic lying in fetal position right side.  No acute distress.  Opens eyes and looks at me during interview HEENT:  NCAT, sunken eyes, persistently dry MM Respiratory system: Clear to auscultation bilaterally Cardiovascular system: Regular rate and rhythm, normal S1/S2. No murmurs, rubs, gallops or clicks.   Cool extremities with duskiness of the fingertips Gastrointestinal system: Normal active bowel sounds, soft, nondistended, nontender. MSK:  Normal tone and bulk, no lower extremity edema Neuro:  Grossly moves all extremities    Data Reviewed: I have personally reviewed following labs and imaging studies  CBC:  Recent Labs Lab 02/07/16 0753 02/07/16 1059 02/08/16 0605 02/09/16 0536  WBC 8.5  --  8.0 10.2  NEUTROABS 5.6  --   --   --   HGB 10.3* 11.2* 9.9* 9.4*  HCT 34.7* 33.0* 34.9* 32.1*  MCV 94.3  --  93.3 93.9  PLT 197  --  167 [id number]*   Basic Metabolic Panel:  Recent Labs Lab 02/08/16 0605 02/08/16 1333 02/08/16 2124 02/09/16 0536 02/09/16 1253  NA 169* 162* 162* 156* 154*  K 3.1* 3.0* 3.3* 3.0* 3.5  CL >130* >130* >130* 125* 124*  CO2 28 27 26 24 24   GLUCOSE 132* 148* 132* 125* 124*  BUN 34* 27* 22* 17 14  CREATININE 1.03* 0.79 0.79 0.69 0.71  CALCIUM 8.3* 7.7* 8.0* 8.0* 8.0*  MG 2.5*  --   --   --   --   PHOS 2.3*  --   --   --   --    GFR: Estimated Creatinine Clearance: 30.6 mL/min (by C-G formula based on SCr of 0.71 mg/dL). Liver Function Tests:  Recent Labs Lab 02/07/16 0753  AST 25  ALT 22  ALKPHOS 65  BILITOT 0.2*  PROT 6.8  ALBUMIN 3.3*   No results for input(s): LIPASE, AMYLASE in the last 168 hours. No results for input(s): AMMONIA in the last 168 hours. Coagulation Profile: No results for input(s): INR, PROTIME in the last 168 hours. Cardiac Enzymes: No results for input(s): CKTOTAL, CKMB, CKMBINDEX, TROPONINI in the last 168 hours. BNP (last 3 results) No results for input(s): PROBNP in the last 8760 hours. HbA1C: No results for input(s): HGBA1C in the last 72 hours. CBG: No results for input(s): GLUCAP in the last 168 hours. Lipid Profile: No results for input(s): CHOL, HDL, LDLCALC, TRIG, CHOLHDL, LDLDIRECT in the last 72 hours. Thyroid Function Tests: No results for input(s): TSH, T4TOTAL, FREET4, T3FREE, THYROIDAB in the last  72 hours. Anemia Panel: No results for input(s): VITAMINB12, FOLATE, FERRITIN, TIBC, IRON, RETICCTPCT in the last 72 hours. Urine analysis:    Component Value Date/Time   COLORURINE YELLOW 02/07/2016 [place] 02/07/2016 0807   LABSPEC 1.026 02/07/2016 0807   PHURINE 5.0 02/07/2016 0807   GLUCOSEU NEGATIVE 02/07/2016 0807   HGBUR NEGATIVE 02/07/2016 [place] NEGATIVE 02/07/2016 [place] 02/07/2016 0807   PROTEINUR NEGATIVE 02/07/2016 0807   UROBILINOGEN 0.2 12/01/2011 1130   NITRITE NEGATIVE 02/07/2016 [id number]  LEUKOCYTESUR NEGATIVE 02/07/2016 0807   Sepsis Labs: @LABRCNTIP (procalcitonin:4,lacticidven:4)  )No results found for this or any previous visit (from the past 240 hour(s)).    Radiology Studies: Dg Chest Port 1 View  Result Date: 02/09/2016 CLINICAL DATA:  Hypoxia EXAM: PORTABLE CHEST 1 VIEW COMPARISON:  02/07/2016 FINDINGS: Mild cardiomegaly. Minimal bibasilar atelectasis. No effusions or edema. No acute bony abnormality. IMPRESSION: Mild cardiomegaly and minimal bibasilar atelectasis. Electronically Signed   By: Rolm Baptise M.D.   On: 02/09/2016 10:39     Scheduled Meds: . aspirin  81 mg Oral Daily  . digoxin  0.0625 mg Oral Daily  . diltiazem  60 mg Oral Q6H  . enoxaparin (LOVENOX) injection  20 mg Subcutaneous Q24H  . ipratropium-albuterol  3 mL Nebulization Once  . metoprolol tartrate  12.5 mg Oral BID  . OCUSOFT LID SCRUB  1 each Apply externally Daily  . potassium chloride (KCL MULTIRUN) 30 mEq in 265 mL IVPB  30 mEq Intravenous Once  . potassium phosphate (monobasic)  500 mg Oral TID WC  . senna-docusate  1 tablet Oral Daily   Continuous Infusions: . dextrose 5 % with KCl 20 mEq / L 20 mEq (02/09/16 0640)     LOS: 2 days    Time spent: 30 min    Janece Canterbury, MD [facility] Pager [phone]  If 7PM-7AM, please contact night-coverage [email or web address] Password [facility] 02/09/2016, 2:31 PM

## 2016-02-09 NOTE — Progress Notes (Signed)
OT Cancellation Note  Patient Details Name: Stacey Huang MRN: [id number] DOB: 09-05-1922   Cancelled Treatment:    Reason Eval/Treat Not Completed: Other (comment). Spoke with PT in hallway about this patient and read their note. Pt not following commands and not able to participate meaningfully in therapy. We will sign off.  Almon Register [id number] 02/09/2016, 11:43 AM

## 2016-02-09 NOTE — Progress Notes (Addendum)
CRITICAL VALUE ALERT  Critical value received:  Lactic acid 2.6; troponin 0.04  Date of notification: 02/09/2016  Time of notification:  [id number]  Critical value read back: yes  Nurse who received alert:  Maylon Cos, RN  MD notified (1st page):  Dr. Sheran Fava  Time of first page:  Verbally told MD  MD notified (2nd page):  Time of second page:  Responding MD:    Time MD responded:

## 2016-02-10 LAB — CBC
HCT: 28.1 % — ABNORMAL LOW (ref 36.0–46.0)
Hemoglobin: 8.8 g/dL — ABNORMAL LOW (ref 12.0–15.0)
MCH: 28 pg (ref 26.0–34.0)
MCHC: 31.3 g/dL (ref 30.0–36.0)
MCV: 89.5 fL (ref 78.0–100.0)
PLATELETS: 134 10*3/uL — AB (ref 150–400)
RBC: 3.14 MIL/uL — ABNORMAL LOW (ref 3.87–5.11)
RDW: 15.3 % (ref 11.5–15.5)
WBC: 9 10*3/uL (ref 4.0–10.5)

## 2016-02-10 LAB — BASIC METABOLIC PANEL
Anion gap: 4 — ABNORMAL LOW (ref 5–15)
BUN: 9 mg/dL (ref 6–20)
CALCIUM: 7.6 mg/dL — AB (ref 8.9–10.3)
CHLORIDE: 122 mmol/L — AB (ref 101–111)
CO2: 22 mmol/L (ref 22–32)
CREATININE: 0.64 mg/dL (ref 0.44–1.00)
GFR calc non Af Amer: >60 mL/min (ref >60)
GLUCOSE: 109 mg/dL — AB (ref 65–99)
Potassium: 3.3 mmol/L — ABNORMAL LOW (ref 3.5–5.1)
Sodium: 148 mmol/L — ABNORMAL HIGH (ref 135–145)

## 2016-02-10 LAB — TROPONIN I: TROPONIN I: 0.04 ng/mL — AB (ref <0.03)

## 2016-02-10 MED ORDER — SODIUM CHLORIDE 0.9 % IV SOLN
30.0000 meq | Freq: Once | INTRAVENOUS | Status: AC
  Administered 2016-02-10: 30 meq via INTRAVENOUS
  Filled 2016-02-10: qty 15

## 2016-02-10 NOTE — Progress Notes (Signed)
Pharmacy - Lovenox   Assessment: Pharmacy consulted to dose lovenox for VTE prophylaxis in this 7 yoF admitted for AMS.  PMH AFib not on anticoag, Hx GIB, falls, dementia.   Wt 42 kg  CrCl 31 ml/min  Also on ASA 81 mg daily  Hgb/Pltc trended down from admission, but no bleeding currently reported per RN/MD   Plan:  Continue Lovenox 20 mg SQ q24h.  Using lower than normal dose d/t advanced age, low weight, poor renal function, and other bleeding risk factors (30 mg daily would be close to treatment dosing)  Monitor CBC closely and for s/sx of bleeding.   Lindell Spar, PharmD, BCPS Pager: [phone] 02/10/2016 4:39 PM

## 2016-02-10 NOTE — Progress Notes (Signed)
PROGRESS NOTE  Stacey Huang  [id number] DOB: May 03, 1922 DOA: 02/07/2016 PCP: Sande Brothers, MD  Brief Narrative:   Stacey Huang  is a 83 y.o. female from SNF with past medical history of ischemic colitis, hypertension, severe dementia/Alzheimer's dementia, atrial fibrillation not on anticoagulation secondary to falls who was brought to ED for worsening mental status.  At baseline patient is nonverbal, total assist, she mumbles occasionally, but she was less responsive.  No documented of fever, diarrhea, vomiting, cough.  In ED patient had negative urinalysis, chest x-ray with no acute finding, but she was severely clinically dehydrated, with a sodium of 176, potassium of 2.7, no leukocytosis.  Sodium remained elevated despite hydration with half normal saline for the last day and she has only voided once.   Have ordered additional bolus and changed fluids to D5W today.  More alert today so SLP evaluation ordered and diet order placed.    Assessment & Plan:   Active Problems:   Atrial fibrillation (HCC)   Gastroesophageal reflux disease   Dementia   Hypokalemia   History of fall   Alzheimer's dementia   Hypernatremia   CKD (chronic kidney disease) stage 3, GFR 30-59 ml/min   Acute encephalopathy   Altered mental status   Hypotension  Acute encephalopathy due to dehydration and hypernatremia secondary to progressive severe dementia, slightly more alert today  -  CT head without acute change -  SLP:  Advanced diet to dysphagia 1 with nectar thick -  If not drinking much, will change to allow free water  Hypotension, likely due to dehydration but excluding sepsis -  Cultures negative -  Continue antibiotics  -  Troponins only marginally elevated  -  Continue IVF  Acute hypoxic respiratory failure, spurious due to poor perfusion.  ABG confirmed she was oxygenating well even though her pulse ox was low.    Hypernatremia, gradually improving -  Continue D5W with  potassium  Magnesium elevated  Mild hypophosphatemia -  Repeat phos in AM  Hypokalemia - continue potassium in IV fluids - give additional IV potassium  Acute on CKD stage III, creatinine trending down from 1.2 to 0.71 with IVF - continue to hold lasix  Alzheimer's dementia - Resume Remeron were mentation back to baseline  Hypertension - Continue with home medication  Atrial fibrillation - As per reviewing records, she's not anticoagulation candidate due to multiple falls and history of GI bleed - Continue aspirin, continue with Cardizem and metoprolol for heart rate control  Normocytic anemia, hemoglobin trending down likely due to hemodilution.  No obvious bleeding -  Will hold off on anemia work up at this time because planning family meeting for tomorrow  Thrombocytopenia due to severe illness -  No obvious bleeding.     DVT prophylaxis:  lovenox Code Status:  DNR Family Communication:  No family at bedside but spoke to son by phone.  Set up a family meeting for tomorrow afternoon at [place] with both sons to discuss hospice and transition to comfort care Disposition Plan:  Disposition unclear.  Recommend palliative care or hospice support at SNF.     Consultants:   none  Procedures:  none  Antimicrobials:  Anti-infectives    Start     Dose/Rate Route Frequency Ordered Stop   02/10/16 1800  vancomycin (VANCOCIN) IVPB 750 mg/150 ml premix     750 mg 150 mL/hr over 60 Minutes Intravenous Every 24 hours 02/09/16 1806     02/10/16 0100  piperacillin-tazobactam (ZOSYN) IVPB 3.375  g     3.375 g 12.5 mL/hr over 240 Minutes Intravenous Every 8 hours 02/09/16 1806     02/09/16 1745  piperacillin-tazobactam (ZOSYN) IVPB 3.375 g     3.375 g 100 mL/hr over 30 Minutes Intravenous  Once 02/09/16 1743 02/09/16 1856   02/09/16 1745  vancomycin (VANCOCIN) IVPB 1000 mg/200 mL premix     1,000 mg 200 mL/hr over 60 Minutes Intravenous  Once 02/09/16 1743 02/09/16 1951        Subjective: Nonverbal today  Objective: Vitals:   02/10/16 0004 02/10/16 0634 02/10/16 1000 02/10/16 1347  BP:  116/64 140/79 104/68  Pulse: (!) 114 (!) 126 (!) 101 (!) 112  Resp:  18 13 14   Temp: 98.3 F (36.8 C) 97.7 F (36.5 C) 98.8 F (37.1 C)   TempSrc: Oral Axillary Axillary   SpO2: (!) 86% 100% 100% 100%  Weight:      Height:        Intake/Output Summary (Last 24 hours) at 02/10/16 1655 Last data filed at 02/10/16 1200  Gross per 24 hour  Intake             1155 ml  Output                3 ml  Net             1152 ml   Filed Weights   02/07/16 1625  Weight: 42.3 kg (93 lb 4.1 oz)    Examination:  General exam:  Adult female, cachectic lying in fetal position right side.  No acute distress.  Barely opens eyes HEENT:  NCAT, sunken eyes, persistently dry MM Respiratory system: Clear to auscultation bilaterally Cardiovascular system: tachycardic, normal S1/S2. No murmurs, rubs, gallops or clicks.  Capillary refill 2-3 seconds Gastrointestinal system: Normal active bowel sounds, soft, nondistended, nontender. MSK:  Decreased tone and bulk, no lower extremity edema Neuro:  Grossly moves all extremities    Data Reviewed: I have personally reviewed following labs and imaging studies  CBC:  Recent Labs Lab 02/07/16 0753 02/07/16 1059 02/08/16 0605 02/09/16 0536 02/09/16 1743 02/10/16 0516  WBC 8.5  --  8.0 10.2 9.1 9.0  NEUTROABS 5.6  --   --   --  6.5  --   HGB 10.3* 11.2* 9.9* 9.4* 8.9* 8.8*  HCT 34.7* 33.0* 34.9* 32.1* 30.2* 28.1*  MCV 94.3  --  93.3 93.9 90.1 89.5  PLT 197  --  167 145* 143* Q000111Q*   Basic Metabolic Panel:  Recent Labs Lab 02/08/16 0605  02/09/16 0536 02/09/16 1253 02/09/16 1743 02/09/16 2235 02/10/16 0516  NA 169*  < > 156* 154* 152* 149* 148*  K 3.1*  < > 3.0* 3.5 3.9 3.9 3.3*  CL >130*  < > 125* 124* 123* 124* 122*  CO2 28  < > 24 24 24 22 22   GLUCOSE 132*  < > 125* 124* 114* 76 109*  BUN 34*  < > 17 14 13 9 9    CREATININE 1.03*  < > 0.69 0.71 0.67 0.53 0.64  CALCIUM 8.3*  < > 8.0* 8.0* 7.9* 7.4* 7.6*  MG 2.5*  --   --   --  2.0  --   --   PHOS 2.3*  --   --   --  2.0*  --   --   < > = values in this interval not displayed. GFR: Estimated Creatinine Clearance: 30.6 mL/min (by C-G formula based on SCr of 0.64 mg/dL).  Liver Function Tests:  Recent Labs Lab 02/07/16 0753 02/09/16 1743  AST 25 26  ALT 22 21  ALKPHOS 65 63  BILITOT 0.2* 0.5  PROT 6.8 6.0*  ALBUMIN 3.3* 2.8*   No results for input(s): LIPASE, AMYLASE in the last 168 hours. No results for input(s): AMMONIA in the last 168 hours. Coagulation Profile: No results for input(s): INR, PROTIME in the last 168 hours. Cardiac Enzymes:  Recent Labs Lab 02/09/16 1743 02/09/16 2235 02/10/16 0516  TROPONINI 0.04* 0.03* 0.04*   BNP (last 3 results) No results for input(s): PROBNP in the last 8760 hours. HbA1C: No results for input(s): HGBA1C in the last 72 hours. CBG: No results for input(s): GLUCAP in the last 168 hours. Lipid Profile: No results for input(s): CHOL, HDL, LDLCALC, TRIG, CHOLHDL, LDLDIRECT in the last 72 hours. Thyroid Function Tests: No results for input(s): TSH, T4TOTAL, FREET4, T3FREE, THYROIDAB in the last 72 hours. Anemia Panel: No results for input(s): VITAMINB12, FOLATE, FERRITIN, TIBC, IRON, RETICCTPCT in the last 72 hours. Urine analysis:    Component Value Date/Time   COLORURINE YELLOW 02/07/2016 0807   APPEARANCEUR CLEAR 02/07/2016 0807   LABSPEC 1.026 02/07/2016 0807   PHURINE 5.0 02/07/2016 0807   GLUCOSEU NEGATIVE 02/07/2016 0807   HGBUR NEGATIVE 02/07/2016 0807   BILIRUBINUR NEGATIVE 02/07/2016 0807   KETONESUR NEGATIVE 02/07/2016 0807   PROTEINUR NEGATIVE 02/07/2016 0807   UROBILINOGEN 0.2 12/01/2011 1130   NITRITE NEGATIVE 02/07/2016 0807   LEUKOCYTESUR NEGATIVE 02/07/2016 0807   Sepsis Labs: @LABRCNTIP (procalcitonin:4,lacticidven:4)  ) Recent Results (from the past 240 hour(s))   Culture, blood (Routine X 2) w Reflex to ID Panel     Status: None (Preliminary result)   Collection Time: 02/09/16  5:41 PM  Result Value Ref Range Status   Specimen Description BLOOD LEFT ANTECUBITAL  Final   Special Requests BOTTLES DRAWN AEROBIC AND ANAEROBIC 5CC  Final   Culture   Final    NO GROWTH < 24 HOURS Performed at [facility], [place] [address] [place], [place] [address]    Report Status PENDING  Incomplete  Culture, blood (Routine X 2) w Reflex to ID Panel     Status: None (Preliminary result)   Collection Time: 02/09/16  6:16 PM  Result Value Ref Range Status   Specimen Description BLOOD RIGHT ARM  Final   Special Requests BOTTLES DRAWN AEROBIC ONLY 6CC  Final   Culture   Final    NO GROWTH < 24 HOURS Performed at [facility], [place] [address], [place], [place] [address]    Report Status PENDING  Incomplete      Radiology Studies: Dg Chest Port 1 View  Result Date: 02/09/2016 CLINICAL DATA:  Hypoxia EXAM: PORTABLE CHEST 1 VIEW COMPARISON:  02/07/2016 FINDINGS: Mild cardiomegaly. Minimal bibasilar atelectasis. No effusions or edema. No acute bony abnormality. IMPRESSION: Mild cardiomegaly and minimal bibasilar atelectasis. Electronically Signed   By: Rolm Baptise M.D.   On: 02/09/2016 10:39     Scheduled Meds: . aspirin  81 mg Oral Daily  . enoxaparin (LOVENOX) injection  20 mg Subcutaneous Q24H  . piperacillin-tazobactam (ZOSYN)  IV  3.375 g Intravenous Q8H  . potassium chloride (KCL MULTIRUN) 30 mEq in 265 mL IVPB  30 mEq Intravenous Once  . vancomycin  750 mg Intravenous Q24H   Continuous Infusions: . dextrose 5 % with KCl 20 mEq / L 20 mEq (02/10/16 0823)     LOS: 3 days    Time  spent: 30 min    Janece Canterbury, MD [facility] Pager [phone]  If 7PM-7AM, please contact night-coverage [email or web address] Password [email or web address] 02/10/2016, 4:55 PM

## 2016-02-10 NOTE — Progress Notes (Signed)
Patient is from [facility]. Per Anderson Malta [phone], at facility -patient does not meet criteria to return. Patient will see palliative  to help determine disposition.  Facility does not do weekend discharges.  CSW will continue to assist with patient discharge.   Kathrin Greathouse, Latanya Presser, MSW Clinical Social Worker 5E and Psychiatric Service Line [phone] 02/10/2016  4:14 PM

## 2016-02-11 LAB — CBC
HCT: 29.3 % — ABNORMAL LOW (ref 36.0–46.0)
HEMOGLOBIN: 9.1 g/dL — AB (ref 12.0–15.0)
MCH: 27 pg (ref 26.0–34.0)
MCHC: 31.1 g/dL (ref 30.0–36.0)
MCV: 86.9 fL (ref 78.0–100.0)
Platelets: 129 10*3/uL — ABNORMAL LOW (ref 150–400)
RBC: 3.37 MIL/uL — ABNORMAL LOW (ref 3.87–5.11)
RDW: 15.2 % (ref 11.5–15.5)
WBC: 9.1 10*3/uL (ref 4.0–10.5)

## 2016-02-11 LAB — BASIC METABOLIC PANEL
ANION GAP: 5 (ref 5–15)
BUN: 7 mg/dL (ref 6–20)
CHLORIDE: 117 mmol/L — AB (ref 101–111)
CO2: 24 mmol/L (ref 22–32)
Calcium: 8 mg/dL — ABNORMAL LOW (ref 8.9–10.3)
Creatinine, Ser: 0.75 mg/dL (ref 0.44–1.00)
GFR calc Af Amer: >60 mL/min (ref >60)
GFR calc non Af Amer: >60 mL/min (ref >60)
GLUCOSE: 117 mg/dL — AB (ref 65–99)
POTASSIUM: 3.2 mmol/L — AB (ref 3.5–5.1)
Sodium: 146 mmol/L — ABNORMAL HIGH (ref 135–145)

## 2016-02-11 LAB — PHOSPHORUS: Phosphorus: 2.4 mg/dL — ABNORMAL LOW (ref 2.5–4.6)

## 2016-02-11 LAB — MAGNESIUM: MAGNESIUM: 1.9 mg/dL (ref 1.7–2.4)

## 2016-02-11 LAB — PROCALCITONIN: Procalcitonin: <0.1 ng/mL

## 2016-02-11 MED ORDER — POTASSIUM CHLORIDE 2 MEQ/ML IV SOLN
30.0000 meq | Freq: Once | INTRAVENOUS | Status: AC
  Administered 2016-02-11: 30 meq via INTRAVENOUS
  Filled 2016-02-11: qty 15

## 2016-02-11 MED ORDER — MAGNESIUM SULFATE IN D5W 1-5 GM/100ML-% IV SOLN
1.0000 g | Freq: Once | INTRAVENOUS | Status: AC
  Administered 2016-02-11: 1 g via INTRAVENOUS
  Filled 2016-02-11: qty 100

## 2016-02-11 MED ORDER — LOPERAMIDE HCL 2 MG PO CAPS: 2.0000 mg | ORAL_CAPSULE | ORAL | Status: DC | PRN

## 2016-02-11 NOTE — Progress Notes (Signed)
CSW received consult for residential hospice placement, confirmed with patient's son - [name] [phone]) that [facility] would be their first choice, second choice would be [facility]. CSW made referral to [facility] RN, Amy - awaiting response re: bed availability/eligibility.    Raynaldo Opitz, [facility] Clinical Social Worker cell #: [phone]

## 2016-02-11 NOTE — Progress Notes (Signed)
CSW spoke with patient's son, [name] who states that he got the two preferences confused and would like for her to go to [facility] if available, if not - then [facility]. CSW made referral to [facility] - awaiting response re: bed availability/eligibility.    Raynaldo Opitz, [facility] Clinical Social Worker cell #: [phone]

## 2016-02-11 NOTE — Progress Notes (Signed)
[place] [facility] Liaison  Received request from [place], [place] for family interest in [place] with request for transfer today.  Chart reviewed.  Spoke with son, [name], over the phone and family advised that their first choice is [place] because the location is better for them.  (I updated Claiborne Billings with this information).  Family is agreeable to transfer if no beds available with [facility].  CSW advised will contact me back in regards to same.    Thank you,  Edyth Gunnels, RN, [facility] Liaison [phone]  All hospital liaison's are now on [place].  Please feel free to call me at the above number or call hospice at [phone] after 5pm.

## 2016-02-11 NOTE — Progress Notes (Addendum)
PROGRESS NOTE  Stacey Huang  DHW:[id number] DOB: July 08, 1922 DOA: 02/07/2016 PCP: Sande Brothers, MD  Brief Narrative:   Stacey Huang  is a 83 y.o. female from SNF with past medical history of ischemic colitis, hypertension, severe dementia/Alzheimer's dementia, bedbound and nonverbal, 10-kg weight loss over the last year, atrial fibrillation not on anticoagulation secondary to falls who was brought to ED for worsening mental status.  At baseline patient is total assist, she mumbles occasionally, but she was less responsive.  No documented of fever, diarrhea, vomiting, cough.  In ED patient had negative urinalysis, chest x-ray with no acute finding, but she was severely clinically dehydrated, with a sodium of 176, potassium of 2.7, no leukocytosis.  Her hospital course was complicated by hypotension on 2/8 which responded to additional IVF.  She has not been eating or drinking.  Her prognosis given that she is only consuming a few bites of per day with lots of encouragement from family is 2-3 weeks.  Family has elected comfort measures.    Assessment & Plan:   Active Problems:   Atrial fibrillation (HCC)   Gastroesophageal reflux disease   Dementia   Hypokalemia   History of fall   Alzheimer's dementia   Hypernatremia   CKD (chronic kidney disease) stage 3, GFR 30-59 ml/min   Acute encephalopathy   Altered mental status   Hypotension  Acute encephalopathy due to dehydration and hypernatremia secondary to severe dementia, slightly more alert today  -  CT head without acute change -  SLP:  Advanced diet to dysphagia 1 -  Adjust diet to allow thin liquids   Hypotension (70/30s) on 2/8, likely due to dehydration but excluding sepsis, resolved with IVF and antibiotics but no new source of infection identified -  D/c antibiotics as goal changed to comfort. -- Troponins only marginally elevated  -  D/c IVF   Diarrhea -  Stopped stool softeners -  Start prn imodium  Acute hypoxic  respiratory failure, spurious due to poor perfusion.  ABG confirmed she was oxygenating well even though her pulse ox was low.    Hypernatremia, gradually improving -  No further blood draws -  D/c IVF  Magnesium elevated  Mild hypophosphatemia, no further blood draws  Hypokalemia,  - given additional potassium via IV this morning prior to [place] conversation  Acute on CKD stage III, creatinine trending down from 1.2 to 0.71 with IVF - hold lasix  Alzheimer's dementia - continue to hold Remeron   Hypertension - Continue with home medication  Atrial fibrillation - As per reviewing records, she's not anticoagulation candidate due to multiple falls and history of GI bleed - d/c aspirin -  Cardizem and metoprolol already discontinued due to hypotension  Normocytic anemia, hemoglobin trending down likely due to hemodilution.  No obvious bleeding.    Thrombocytopenia due to severe illness -  No obvious bleeding.    DVT prophylaxis:   D/c lovenox for comfort Code Status:  DNR Family Communication:  Met with both sons.  Discussed natural progression of dementia, including gradual decrease in appetite and thirst.   Disposition Plan:  Recommend residential hospice for end of life care.  SW consult placed and bed likely available on 02/12/2016     Consultants:   none  Procedures:  none  Antimicrobials:  Anti-infectives    Start     Dose/Rate Route Frequency Ordered Stop   02/10/16 1800  vancomycin (VANCOCIN) IVPB 750 mg/150 ml premix  Status:  Discontinued  750 mg 150 mL/hr over 60 Minutes Intravenous Every 24 hours 02/09/16 1806 02/11/16 0925   02/10/16 0100  piperacillin-tazobactam (ZOSYN) IVPB 3.375 g  Status:  Discontinued     3.375 g 12.5 mL/hr over 240 Minutes Intravenous Every 8 hours 02/09/16 1806 02/11/16 0925   02/09/16 1745  piperacillin-tazobactam (ZOSYN) IVPB 3.375 g     3.375 g 100 mL/hr over 30 Minutes Intravenous  Once 02/09/16 1743 02/09/16 1856    02/09/16 1745  vancomycin (VANCOCIN) IVPB 1000 mg/200 mL premix     1,000 mg 200 mL/hr over 60 Minutes Intravenous  Once 02/09/16 1743 02/09/16 1951       Subjective: Makes some soft jibberish noises.  Nods head occasionally.    Objective: Vitals:   02/10/16 1000 02/10/16 1347 02/10/16 2042 02/11/16 0451  BP: 140/79 104/68 109/76 (!) 108/53  Pulse: (!) 101 (!) 112 97 100  Resp: 13 14 20 20   Temp: 98.8 F (37.1 C)  98.7 F (37.1 C) 98.9 F (37.2 C)  TempSrc: Axillary  Axillary Axillary  SpO2: 100% 100% 98% 100%  Weight:      Height:       No intake or output data in the 24 hours ending 02/11/16 1321 Filed Weights   02/07/16 1625  Weight: 42.3 kg (93 lb 4.1 oz)    Examination:  General exam:  Frail appearing adult female. No acute distress.  Minimally responsive HEENT:  NCAT, mouth still a little dry Respiratory system: Clear to auscultation bilaterally Cardiovascular system: tachycardic, normal S1/S2. No murmurs, rubs, gallops or clicks.  Capillary refill 2-3 seconds Gastrointestinal system: Normal active bowel sounds, soft, nondistended, nontender. MSK:  Decreased tone and bulk, no lower extremity edema Neuro:  Grossly moves all extremities    Data Reviewed: I have personally reviewed following labs and imaging studies  CBC:  Recent Labs Lab 02/07/16 0753  02/08/16 0605 02/09/16 0536 02/09/16 1743 02/10/16 0516 02/11/16 0551  WBC 8.5  --  8.0 10.2 9.1 9.0 9.1  NEUTROABS 5.6  --   --   --  6.5  --   --   HGB 10.3*  < > 9.9* 9.4* 8.9* 8.8* 9.1*  HCT 34.7*  < > 34.9* 32.1* 30.2* 28.1* 29.3*  MCV 94.3  --  93.3 93.9 90.1 89.5 86.9  PLT 197  --  167 145* 143* 134* 129*  < > = values in this interval not displayed. Basic Metabolic Panel:  Recent Labs Lab 02/08/16 0605  02/09/16 1253 02/09/16 1743 02/09/16 2235 02/10/16 0516 02/11/16 0551  NA 169*  < > 154* 152* 149* 148* 146*  K 3.1*  < > 3.5 3.9 3.9 3.3* 3.2*  CL >130*  < > 124* 123* 124* 122* 117*   CO2 28  < > 24 24 22 22 24   GLUCOSE 132*  < > 124* 114* 76 109* 117*  BUN 34*  < > 14 13 9 9 7   CREATININE 1.03*  < > 0.71 0.67 0.53 0.64 0.75  CALCIUM 8.3*  < > 8.0* 7.9* 7.4* 7.6* 8.0*  MG 2.5*  --   --  2.0  --   --  1.9  PHOS 2.3*  --   --  2.0*  --   --  2.4*  < > = values in this interval not displayed. GFR: Estimated Creatinine Clearance: 30.6 mL/min (by C-G formula based on SCr of 0.75 mg/dL). Liver Function Tests:  Recent Labs Lab 02/07/16 0753 02/09/16 1743  AST 25 26  ALT 22 21  ALKPHOS 65 63  BILITOT 0.2* 0.5  PROT 6.8 6.0*  ALBUMIN 3.3* 2.8*   No results for input(s): LIPASE, AMYLASE in the last 168 hours. No results for input(s): AMMONIA in the last 168 hours. Coagulation Profile: No results for input(s): INR, PROTIME in the last 168 hours. Cardiac Enzymes:  Recent Labs Lab 02/09/16 1743 02/09/16 2235 02/10/16 0516  TROPONINI 0.04* 0.03* 0.04*   BNP (last 3 results) No results for input(s): PROBNP in the last 8760 hours. HbA1C: No results for input(s): HGBA1C in the last 72 hours. CBG: No results for input(s): GLUCAP in the last 168 hours. Lipid Profile: No results for input(s): CHOL, HDL, LDLCALC, TRIG, CHOLHDL, LDLDIRECT in the last 72 hours. Thyroid Function Tests: No results for input(s): TSH, T4TOTAL, FREET4, T3FREE, THYROIDAB in the last 72 hours. Anemia Panel: No results for input(s): VITAMINB12, FOLATE, FERRITIN, TIBC, IRON, RETICCTPCT in the last 72 hours. Urine analysis:    Component Value Date/Time   COLORURINE YELLOW 02/07/2016 0807   APPEARANCEUR CLEAR 02/07/2016 0807   LABSPEC 1.026 02/07/2016 0807   PHURINE 5.0 02/07/2016 0807   GLUCOSEU NEGATIVE 02/07/2016 0807   HGBUR NEGATIVE 02/07/2016 0807   BILIRUBINUR NEGATIVE 02/07/2016 0807   KETONESUR NEGATIVE 02/07/2016 0807   PROTEINUR NEGATIVE 02/07/2016 0807   UROBILINOGEN 0.2 12/01/2011 1130   NITRITE NEGATIVE 02/07/2016 0807   LEUKOCYTESUR NEGATIVE 02/07/2016 0807   Sepsis  Labs: @LABRCNTIP (procalcitonin:4,lacticidven:4)  ) Recent Results (from the past 240 hour(s))  Culture, blood (Routine X 2) w Reflex to ID Panel     Status: None (Preliminary result)   Collection Time: 02/09/16  5:41 PM  Result Value Ref Range Status   Specimen Description BLOOD LEFT ANTECUBITAL  Final   Special Requests BOTTLES DRAWN AEROBIC AND ANAEROBIC 5CC  Final   Culture   Final    NO GROWTH 2 DAYS Performed at [facility], [place] [address], [place], [place] [address]    Report Status PENDING  Incomplete  Culture, blood (Routine X 2) w Reflex to ID Panel     Status: None (Preliminary result)   Collection Time: 02/09/16  6:16 PM  Result Value Ref Range Status   Specimen Description BLOOD RIGHT ARM  Final   Special Requests BOTTLES DRAWN AEROBIC ONLY 6CC  Final   Culture   Final    NO GROWTH 2 DAYS Performed at [facility], [place] [address], [place], [place] [address]    Report Status PENDING  Incomplete      Radiology Studies: No results found.   Scheduled Meds: . enoxaparin (LOVENOX) injection  20 mg Subcutaneous Q24H   Continuous Infusions: . dextrose 5 % with KCl 20 mEq / L 20 mEq (02/11/16 0819)     LOS: 4 days    Time spent: 30 min    Janece Canterbury, MD [facility] Pager [phone]  If 7PM-7AM, please contact night-coverage [email or web address] Password [email or web address] 02/11/2016, 1:21 PM

## 2016-02-11 NOTE — Consult Note (Signed)
[facility] referral today from [employer].  Have done a preliminary review of medical records.  Placed a call to son Reshell Demayo at [phone] to explore his interest in meeting to discuss Hospice Home at [facility]. No answer to my call so left message.  Will attempt to call Mr Gleed again tomorrow to discuss Hospice Home at [facility].  Thank you for referral.  Please call [phone] for questions regarding the status of this referral.  Quenton Fetter, RN

## 2016-02-11 NOTE — Progress Notes (Signed)
[place] [facility] Liaison  Received request from [place], [place] for famiily interest in [facility] with request for transfer today.  Chart sent for review.   No beds available today with [facility] per [facility].  Will continue to follow patient.  Thank you,  Edyth Gunnels, RN, [place] [facility] Liaison [phone]  All hospital liaison's are now on [place].  Please call the above number or call hospice at [phone] after 5pm.

## 2016-02-12 MED ORDER — LOPERAMIDE HCL 2 MG PO CAPS: 2.0000 mg | ORAL_CAPSULE | ORAL | 0 refills | Status: DC | PRN

## 2016-02-12 NOTE — Progress Notes (Signed)
Pt left a few moments ago with [facility], headed to [facility]. Family met with Hospice staff this morning and are in agreement with pt transfer. Pt left in hospital paper gown, right FA IV site intact, and O2 at 4l/min via [place]. \ 

## 2016-02-12 NOTE — Discharge Summary (Addendum)
Physician Discharge Summary  Stacey Huang [id number] DOB: [birth year] DOA: 02/07/2016  PCP: Stacey Brothers, MD  Admit date: 02/07/2016 Discharge date: 02/14/2016  Admitted From: [facility] memory care unit  Disposition:  [name], hospice home at [facility]  Discharge Condition:  fair, improved CODE STATUS:  DNR  Diet recommendation:  Dysphagia 1 with thin liquids   Brief/Interim Summary:  EvelynTayloris a 83 y.o.femalefrom SNF with past medical history of ischemic colitis, hypertension, severe dementia/Alzheimer's dementia, bedbound and nonverbal, 10-kg weight loss over the last year, atrial fibrillation not on anticoagulation secondary to falls who was brought to ED for worsening mental status.  At baseline patient is total assist and she mumbles occasionally.  She had become less responsive and therefore was brought to the ER.  No documented of fever, diarrhea, vomiting, cough.  In ED patient had negative urinalysis, chest x-ray with no acute finding, but she was severely clinically dehydrated, with a sodium of 176, potassium of 2.7, no leukocytosis.  Her hospital course was complicated by hypotension on 2/8 which responded to additional IVF.  She has not been eating or drinking.  Her prognosis given that she is only consuming a few bites of per day with lots of encouragement from family is 2-3 weeks.  Family has elected comfort measures.    Discharge Diagnoses:  Active Problems:   Atrial fibrillation (HCC)   Gastroesophageal reflux disease   Dementia   Hypokalemia   History of fall   Alzheimer's dementia   Hypernatremia   CKD (chronic kidney disease) stage 3, GFR 30-59 ml/min   Acute encephalopathy   Altered mental status   Hypotension   Severe protein-calorie malnutrition (HCC)  Acute encephalopathy due to dehydration and hypernatremia secondary to severe dementia.  She became more alert as her dehydration and hypernatremia were corrected.  She was able to open  her eyes and take a few bites of applesauce -  CT head without acute change -  SLP evaluation:  Recommended dysphagia 1 with nectar thick liquids, however, I adjusted to allow thin liquids for comfort   Hypotension (70/30s) on 2/8, likely due to dehydration but excluding sepsis, resolved with IVF and antibiotics but no new source of infection was identified -  D/c antibiotics as goal changed to comfort. -- Troponins only marginally elevated   Diarrhea -  Stopped stool softeners -  Started prn imodium  Acute hypoxic respiratory failure, spurious due to poor perfusion.  ABG confirmed she was oxygenating well even though her pulse ox was low.    Hypernatremia, gradually improved with IVF -  No further blood draws -  discontinued IVF  Magnesium elevated  Mild hypophosphatemia, no further blood draws  Hypokalemia, given multiple doses of IV potassium during her stay  Acute on CKD stage III, creatinine trending down from 1.2 to 0.71 with IVF - discontinued lasix  Alzheimer's dementia, severe, bedbound and unable to communicate.  Discontinued Remeron   Hypertension, discontinued blood pressure medications due to hypotension and emphasis on comfort  Atrial fibrillation, not anticoagulation candidate due to multiple falls and history of GI bleed -  Aspirin was discontinued -  Cardizem and metoprolol already discontinued due to hypotension  Normocytic anemia, hemoglobin trending down likely due to hemodilution.  No obvious bleeding.    Thrombocytopenia due to severe illness -  No obvious bleeding.    Severe malnutrition in context of chronic illness, Underweight  -  Changed to thin liquids -  Gentle encouragement of food and drink for  patient comfort  Discharge Instructions     Medication List    STOP taking these medications   aspirin 81 MG chewable tablet   DAILY-VITE PO   digoxin 0.125 MG tablet Commonly known as:  LANOXIN   diltiazem 60 MG  tablet Commonly known as:  CARDIZEM   furosemide 20 MG tablet Commonly known as:  LASIX   metoprolol tartrate 25 MG tablet Commonly known as:  LOPRESSOR   mirtazapine 15 MG tablet Commonly known as:  REMERON   SENEXON-S 8.6-50 MG tablet Generic drug:  senna-docusate     TAKE these medications   BISAC-EVAC 10 MG suppository Generic drug:  bisacodyl Place 10 mg rectally daily as needed (For constipation.).   loperamide 2 MG capsule Commonly known as:  IMODIUM Take 1 capsule (2 mg total) by mouth as needed for diarrhea or loose stools.   MAPAP 325 MG tablet Generic drug:  acetaminophen Take 650 mg by mouth every 6 (six) hours as needed for pain.   nystatin powder Generic drug:  nystatin Apply 1 g topically 2 (two) times daily as needed (Apply under both breasts for rash.).   OCUSOFT LID SCRUB Pads Apply 1 each topically daily. Apply to eyelids to clean eyes every day.   zinc oxide 20 % ointment Apply 1 application topically 2 (two) times daily.      Follow-up Information    Stacey Brothers, MD Follow up.   Specialty:  Internal Medicine         No Known Allergies  Consultations: None   Procedures/Studies: Ct Head Wo Contrast  Result Date: 02/07/2016 CLINICAL DATA:  Acute encephalopathy. EXAM: CT HEAD WITHOUT CONTRAST TECHNIQUE: Contiguous axial images were obtained from the base of the skull through the vertex without intravenous contrast. COMPARISON:  07/30/2013 FINDINGS: Brain: No acute infarct, hemorrhage, hydrocephalus, or mass. There may be small hygromas about the cerebellum given the prominent extra-axial CSF without folia widening. Cerebral atrophy is severe, especially in the peri-insular and mesial temporal region, correlating with history of Alzheimer's disease. Asymmetric greater prior ischemic injury to the left cerebral hemisphere. Vascular: Atherosclerotic calcification is mild for age. No hyperdense vessel. Skull: No acute finding Sinuses/Orbits:  Negative IMPRESSION: No acute finding. Advanced atrophy in keeping with history of Alzheimer's. Electronically Signed   By: Monte Fantasia M.D.   On: 02/07/2016 13:35   Dg Chest Port 1 View  Result Date: 02/09/2016 CLINICAL DATA:  Hypoxia EXAM: PORTABLE CHEST 1 VIEW COMPARISON:  02/07/2016 FINDINGS: Mild cardiomegaly. Minimal bibasilar atelectasis. No effusions or edema. No acute bony abnormality. IMPRESSION: Mild cardiomegaly and minimal bibasilar atelectasis. Electronically Signed   By: Rolm Baptise M.D.   On: 02/09/2016 10:39   Dg Chest Port 1 View  Result Date: 02/07/2016 CLINICAL DATA:  Altered mental status EXAM: PORTABLE CHEST 1 VIEW COMPARISON:  12/01/2011 FINDINGS: Chronic cardiomegaly. Negative mediastinal contours. Interstitial coarsening greater at the left base where there is also volume loss, compatible with atelectasis. No air bronchogram, Kerley line, effusion, or pneumothorax. Osteopenia. Remote proximal left humerus fracture. IMPRESSION: 1. Mild atelectasis at the left base. 2. Chronic cardiomegaly.  No pulmonary edema. Electronically Signed   By: Monte Fantasia M.D.   On: 02/07/2016 07:25    Subjective: Mumbled this morning.    Discharge Exam: Vitals:   02/12/16 0519 02/12/16 0600  BP: 120/69   Pulse: (!) 119 96  Resp: 19   Temp: 99.2 F (37.3 C)    Vitals:   02/11/16 1320 02/11/16 2003 02/12/16 0519 02/12/16  0600  BP: 99/61 126/77 120/69   Pulse: 99 91 (!) 119 96  Resp: 18 20 19    Temp: 97.6 F (36.4 C) 98.8 F (37.1 C) 99.2 F (37.3 C)   TempSrc: Oral Oral Oral   SpO2: 98% 98% 97%   Weight:      Height:        General: Frail adult female, not in acute distress.  Eyes open but not always focused on me while I was talking with her Cardiovascular:  tachycardic, S1/S2 +, no rubs, no gallops Respiratory: CTA bilaterally, no wheezing, no rhonchi Abdominal: Soft, NT, ND, bowel sounds + Extremities: no edema, no cyanosis    The results of significant  diagnostics from this hospitalization (including imaging, microbiology, ancillary and laboratory) are listed below for reference.     Microbiology: Recent Results (from the past 240 hour(s))  Culture, blood (Routine X 2) w Reflex to ID Panel     Status: None   Collection Time: 02/09/16  5:41 PM  Result Value Ref Range Status   Specimen Description BLOOD LEFT ANTECUBITAL  Final   Special Requests BOTTLES DRAWN AEROBIC AND ANAEROBIC 5CC  Final   Culture   Final    NO GROWTH 5 DAYS Performed at [facility], [address] [place], [place] [address]    Report Status 02/14/2016 FINAL  Final  Culture, blood (Routine X 2) w Reflex to ID Panel     Status: None   Collection Time: 02/09/16  6:16 PM  Result Value Ref Range Status   Specimen Description BLOOD RIGHT ARM  Final   Special Requests BOTTLES DRAWN AEROBIC ONLY 6CC  Final   Culture   Final    NO GROWTH 5 DAYS Performed at [facility], [place] [address] [place], [place] [address]    Report Status 02/14/2016 FINAL  Final     Labs: BNP (last 3 results) No results for input(s): BNP in the last 8760 hours. Basic Metabolic Panel:  Recent Labs Lab 02/08/16 0605  02/09/16 1253 02/09/16 1743 02/09/16 2235 02/10/16 0516 02/11/16 0551  NA 169*  < > 154* 152* 149* 148* 146*  K 3.1*  < > 3.5 3.9 3.9 3.3* 3.2*  CL >130*  < > 124* 123* 124* 122* 117*  CO2 28  < > 24 24 22 22 24   GLUCOSE 132*  < > 124* 114* 76 109* 117*  BUN 34*  < > 14 13 9 9 7   CREATININE 1.03*  < > 0.71 0.67 0.53 0.64 0.75  CALCIUM 8.3*  < > 8.0* 7.9* 7.4* 7.6* 8.0*  MG 2.5*  --   --  2.0  --   --  1.9  PHOS 2.3*  --   --  2.0*  --   --  2.4*  < > = values in this interval not displayed. Liver Function Tests:  Recent Labs Lab 02/09/16 1743  AST 26  ALT 21  ALKPHOS 63  BILITOT 0.5  PROT 6.0*  ALBUMIN 2.8*   No results for input(s): LIPASE, AMYLASE in the last 168 hours. No results for input(s): AMMONIA in the last 168  hours. CBC:  Recent Labs Lab 02/08/16 0605 02/09/16 0536 02/09/16 1743 02/10/16 0516 02/11/16 0551  WBC 8.0 10.2 9.1 9.0 9.1  NEUTROABS  --   --  6.5  --   --   HGB 9.9* 9.4* 8.9* 8.8* 9.1*  HCT 34.9* 32.1* 30.2* 28.1* 29.3*  MCV 93.3 93.9 90.1 89.5  86.9  PLT 167 145* 143* 134* 129*   Cardiac Enzymes:  Recent Labs Lab 02/09/16 1743 02/09/16 2235 02/10/16 0516  TROPONINI 0.04* 0.03* 0.04*   BNP: Invalid input(s): POCBNP CBG: No results for input(s): GLUCAP in the last 168 hours. D-Dimer No results for input(s): DDIMER in the last 72 hours. Hgb A1c No results for input(s): HGBA1C in the last 72 hours. Lipid Profile No results for input(s): CHOL, HDL, LDLCALC, TRIG, CHOLHDL, LDLDIRECT in the last 72 hours. Thyroid function studies No results for input(s): TSH, T4TOTAL, T3FREE, THYROIDAB in the last 72 hours.  Invalid input(s): FREET3 Anemia work up No results for input(s): VITAMINB12, FOLATE, FERRITIN, TIBC, IRON, RETICCTPCT in the last 72 hours. Urinalysis    Component Value Date/Time   COLORURINE YELLOW 02/07/2016 0807   APPEARANCEUR CLEAR 02/07/2016 0807   LABSPEC 1.026 02/07/2016 0807   PHURINE 5.0 02/07/2016 0807   GLUCOSEU NEGATIVE 02/07/2016 0807   HGBUR NEGATIVE 02/07/2016 0807   BILIRUBINUR NEGATIVE 02/07/2016 0807   KETONESUR NEGATIVE 02/07/2016 0807   PROTEINUR NEGATIVE 02/07/2016 0807   UROBILINOGEN 0.2 12/01/2011 1130   NITRITE NEGATIVE 02/07/2016 0807   LEUKOCYTESUR NEGATIVE 02/07/2016 0807   Sepsis Labs Invalid input(s): PROCALCITONIN,  WBC,  LACTICIDVEN   Time coordinating discharge: Over 30 minutes  SIGNED:   Janece Canterbury, MD  [facility] 02/14/2016, 6:57 PM Pager   If 7PM-7AM, please contact night-coverage [email or web address] Password [email or web address]

## 2016-02-12 NOTE — Progress Notes (Signed)
Patient will discharge to [facility] Anticipated discharge date: 2/11 Family notified: pt son, Primary school teacher by [employer]- scheduled for 12:30pm  CSW signing off.  Jorge Ny, LCSW Clinical Social Worker [phone]

## 2016-02-14 DIAGNOSIS — E43 Unspecified severe protein-calorie malnutrition: Secondary | ICD-10-CM

## 2016-02-14 LAB — CULTURE, BLOOD (ROUTINE X 2)
CULTURE: NO GROWTH
Culture: NO GROWTH

## 2016-03-14 ENCOUNTER — Encounter: Payer: Self-pay | Admitting: Internal Medicine

## 2016-03-14 ENCOUNTER — Non-Acute Institutional Stay (SKILLED_NURSING_FACILITY): Payer: Medicare Other | Admitting: Internal Medicine

## 2016-03-14 DIAGNOSIS — K219 Gastro-esophageal reflux disease without esophagitis: Secondary | ICD-10-CM | POA: Diagnosis not present

## 2016-03-14 DIAGNOSIS — N183 Chronic kidney disease, stage 3 unspecified: Secondary | ICD-10-CM

## 2016-03-14 DIAGNOSIS — I4891 Unspecified atrial fibrillation: Secondary | ICD-10-CM

## 2016-03-14 DIAGNOSIS — F0280 Dementia in other diseases classified elsewhere without behavioral disturbance: Secondary | ICD-10-CM | POA: Diagnosis not present

## 2016-03-14 DIAGNOSIS — S22080S Wedge compression fracture of T11-T12 vertebra, sequela: Secondary | ICD-10-CM

## 2016-03-14 DIAGNOSIS — G309 Alzheimer's disease, unspecified: Secondary | ICD-10-CM

## 2016-03-14 DIAGNOSIS — I10 Essential (primary) hypertension: Secondary | ICD-10-CM

## 2016-03-14 DIAGNOSIS — D472 Monoclonal gammopathy: Secondary | ICD-10-CM | POA: Diagnosis not present

## 2016-03-14 NOTE — Progress Notes (Signed)
: Provider:  Noah Delaine. Sheppard Coil, MD Location:  [facility] Room Number: 305D Place of Service:  SNF (31)  PCP: Sande Brothers, MD Patient Care Team: Sande Brothers, MD as PCP - General (Internal Medicine)  Extended Emergency Contact Information Primary Emergency Contact: Korff,Ronnie Address: [address]          [place], [place] Phone: [phone] Mobile Phone: [phone] Relation: Son Secondary Emergency Contact: Learta Codding Address: [address]          [place], [place] [address] [place] Phone: [phone] Relation: Relative     Allergies: Patient has no known allergies.  Chief Complaint  Patient presents with  . New Admit To SNF    Admit to Facility    HPI: Patient is 83 y.o. female with history of ischemic colitis, hypertension, severe dementia/Alzheimer's dementia, bedbound and nonverbal, 10-kg weight loss over the last year, atrial fibrillation not on anticoagulation secondary to falls.At baseline patient is total assist and she mumbles occasionally and only consuming a few bites of per day with lots of encouragement from family.  Last hospitalization   2/6-13 where pt presented with hypotension and multiple electrolyte abnormalities. Pt is admitted to SNF from [facility] where she did not expire as predicted. She is admitted as comfort care only. While at SNF pt will be followed for AF, tx with metoprolol, HTN, tx with metoprolol and constipation tx with miralax.  Past Medical History:  Diagnosis Date  . Alzheimer disease   . Atrial fibrillation ([place])   . Compression fracture of T11 vertebra   . Dementia   . E-coli UTI   . Gastro - esophageal reflux disease   . Hypokalemia   . Ischemic colitis, enteritis, or enterocolitis ([place])   . MGUS (monoclonal gammopathy of unknown significance)   . Nasal septal hematoma   . Shoulder fracture, left     Past Surgical History:    Procedure Laterality Date  . ESOPHAGOGASTRODUODENOSCOPY  11/10/2010   Procedure: ESOPHAGOGASTRODUODENOSCOPY (EGD);  Surgeon: Beryle Beams;  Location: [facility] ENDOSCOPY;  Service: Endoscopy;  Laterality: N/A;  . FLEXIBLE SIGMOIDOSCOPY  11/10/2010   Procedure: FLEXIBLE SIGMOIDOSCOPY;  Surgeon: Beryle Beams;  Location: [facility] ENDOSCOPY;  Service: Gastroenterology;  Laterality: N/A;  . unknown      Allergies as of 03/14/2016   No Known Allergies     Medication List       Accurate as of 03/14/16 11:12 AM. Always use your most recent med list.          BISAC-EVAC 10 MG suppository Generic drug:  bisacodyl Place 10 mg rectally daily as needed (For constipation.).   haloperidol 2 MG tablet Commonly known as:  HALDOL Take 2 mg by mouth every 4 (four) hours as needed for agitation.   LORazepam 0.5 MG tablet Commonly known as:  ATIVAN Take 0.5 mg by mouth at bedtime.   LORazepam 0.5 MG tablet Commonly known as:  ATIVAN Take 0.5 mg by mouth every 2 (two) hours as needed for anxiety.   MAPAP 325 MG tablet Generic drug:  acetaminophen Take 650 mg by mouth every 6 (six) hours as needed for pain.   metoprolol tartrate 12.5 mg Tabs tablet Commonly known as:  LOPRESSOR Take 12.5 mg by mouth 2 (two) times daily.   morphine 20 MG/ML concentrated solution Commonly known as:  ROXANOL Take 2.5 mg by mouth every 2 (two) hours as needed for severe pain.  polyethylene glycol packet Commonly known as:  MIRALAX / GLYCOLAX Take 17 g by mouth every morning.       Meds ordered this encounter  Medications  . morphine (ROXANOL) 20 MG/ML concentrated solution    Sig: Take 2.5 mg by mouth every 2 (two) hours as needed for severe pain.  Marland Kitchen LORazepam (ATIVAN) 0.5 MG tablet    Sig: Take 0.5 mg by mouth at bedtime.  . haloperidol (HALDOL) 2 MG tablet    Sig: Take 2 mg by mouth every 4 (four) hours as needed for agitation.  Marland Kitchen LORazepam (ATIVAN) 0.5 MG tablet    Sig: Take 0.5 mg by mouth every 2  (two) hours as needed for anxiety.  . metoprolol tartrate (LOPRESSOR) 12.5 mg TABS tablet    Sig: Take 12.5 mg by mouth 2 (two) times daily.  . polyethylene glycol (MIRALAX / GLYCOLAX) packet    Sig: Take 17 g by mouth every morning.     There is no immunization history on file for this patient.  Social History  Substance Use Topics  . Smoking status: Never Smoker  . Smokeless tobacco: Never Used  . Alcohol use No    Family history is   Family History  Problem Relation Age of Onset  . Diabetes Mother       Review of Systems  UTO; nursing without concerns   Vitals:   03/14/16 0930  BP: 121/61  Pulse: 98  Resp: 20  Temp: 97.7 F (36.5 C)    SpO2 Readings from Last 1 Encounters:  02/12/16 97%   Body mass index is 34.52 kg/m.     Physical Exam  GENERAL APPEARANCE: minimally responsive No acute distress.  SKIN: No diaphoresis rash HEAD: Normocephalic, atraumatic  EYES: Conjunctiva/lids clear. Pupils round, reactive. EOMs intact.  EARS: External exam WNL, canals clear. Hearing grossly normal.  NOSE: No deformity or discharge.  MOUTH/THROAT: Lips w/o lesions  RESPIRATORY: Breathing is even, unlabored. Lung sounds are clear   CARDIOVASCULAR: Heart RRR no murmurs, rubs or gallops. No peripheral edema.   GASTROINTESTINAL: Abdomen is soft, non-tender, not distended w/ normal bowel sounds. GENITOURINARY: Bladder non tender, not distended  MUSCULOSKELETAL: very very thin NEUROLOGIC: did not move during exam;no focal deficits noted by staff PSYCHIATRIC: depressed MS  Patient Active Problem List   Diagnosis Date Noted  . Severe protein-calorie malnutrition ([place]) 02/14/2016  . Acute encephalopathy   . Altered mental status   . Hypotension   . Hypernatremia 02/07/2016  . CKD (chronic kidney disease) stage 3, GFR 30-59 ml/min 02/07/2016  . Chronic atrial fibrillation ([place]) 07/31/2013  . Hyperkalemia 07/31/2013  . GI bleed 07/30/2013  . Blood loss anemia  12/02/2011  . UTI (lower urinary tract infection) 08/08/2011  . Normocytic anemia 06/23/2011  . Compression fracture of T11 vertebra 06/23/2011  . MGUS (monoclonal gammopathy of unknown significance) 06/23/2011  . Shoulder fracture, left 06/12/2011  . History of fall 11/22/2010  . Nasal septal hematoma 11/22/2010  . Alzheimer's dementia 11/22/2010  . E-coli UTI   . Hypokalemia   . Atrial fibrillation with RVR ([place]) 11/09/2010  . Dementia 11/09/2010  . Atrial fibrillation ([place])   . Gastroesophageal reflux disease       Labs reviewed: Basic Metabolic Panel:    Component Value Date/Time   NA 146 (H) 02/11/2016 0551   K 3.2 (L) 02/11/2016 0551   CL 117 (H) 02/11/2016 0551   CO2 24 02/11/2016 0551   GLUCOSE 117 (H) 02/11/2016 0551   BUN  7 02/11/2016 0551   CREATININE 0.75 02/11/2016 0551   CALCIUM 8.0 (L) 02/11/2016 0551   PROT 6.0 (L) 02/09/2016 1743   ALBUMIN 2.8 (L) 02/09/2016 1743   AST 26 02/09/2016 1743   ALT 21 02/09/2016 1743   ALKPHOS 63 02/09/2016 1743   BILITOT 0.5 02/09/2016 1743   GFRNONAA >60 02/11/2016 0551   GFRAA >60 02/11/2016 0551     Recent Labs  02/08/16 0605  02/09/16 1743 02/09/16 2235 02/10/16 0516 02/11/16 0551  NA 169*  < > 152* 149* 148* 146*  K 3.1*  < > 3.9 3.9 3.3* 3.2*  CL >130*  < > 123* 124* 122* 117*  CO2 28  < > 24 22 22 24   GLUCOSE 132*  < > 114* 76 109* 117*  BUN 34*  < > 13 9 9 7   CREATININE 1.03*  < > 0.67 0.53 0.64 0.75  CALCIUM 8.3*  < > 7.9* 7.4* 7.6* 8.0*  MG 2.5*  --  2.0  --   --  1.9  PHOS 2.3*  --  2.0*  --   --  2.4*  < > = values in this interval not displayed. Liver Function Tests:  Recent Labs  08/26/15 0705 02/07/16 0753 02/09/16 1743  AST 18 25 26   ALT 11* 22 21  ALKPHOS 82 65 63  BILITOT 0.7 0.2* 0.5  PROT 6.9 6.8 6.0*  ALBUMIN 3.6 3.3* 2.8*   No results for input(s): LIPASE, AMYLASE in the last 8760 hours. No results for input(s): AMMONIA in the last 8760 hours. CBC:  Recent Labs   02/07/16 0753  02/09/16 1743 02/10/16 0516 02/11/16 0551  WBC 8.5  < > 9.1 9.0 9.1  NEUTROABS 5.6  --  6.5  --   --   HGB 10.3*  < > 8.9* 8.8* 9.1*  HCT 34.7*  < > 30.2* 28.1* 29.3*  MCV 94.3  < > 90.1 89.5 86.9  PLT 197  < > 143* 134* 129*  < > = values in this interval not displayed. Lipid No results for input(s): CHOL, HDL, LDLCALC, TRIG in the last 8760 hours.  Cardiac Enzymes:  Recent Labs  02/09/16 1743 02/09/16 2235 02/10/16 0516  TROPONINI 0.04* 0.03* 0.04*   BNP: No results for input(s): BNP in the last 8760 hours. No results found for: MICROALBUR No results found for: HGBA1C Lab Results  Component Value Date   TSH 0.846 08/08/2011   Lab Results  Component Value Date   ZDGLOVFI43 329 12/02/2011   Lab Results  Component Value Date   FOLATE 12.6 12/02/2011   Lab Results  Component Value Date   IRON 218 (H) 12/02/2011   TIBC 390 12/02/2011   FERRITIN 10 12/02/2011    Imaging and Procedures obtained prior to SNF admission: Ct Head Wo Contrast  Result Date: 02/07/2016 CLINICAL DATA:  Acute encephalopathy. EXAM: CT HEAD WITHOUT CONTRAST TECHNIQUE: Contiguous axial images were obtained from the base of the skull through the vertex without intravenous contrast. COMPARISON:  07/30/2013 FINDINGS: Brain: No acute infarct, hemorrhage, hydrocephalus, or mass. There may be small hygromas about the cerebellum given the prominent extra-axial CSF without folia widening. Cerebral atrophy is severe, especially in the peri-insular and mesial temporal region, correlating with history of Alzheimer's disease. Asymmetric greater prior ischemic injury to the left cerebral hemisphere. Vascular: Atherosclerotic calcification is mild for age. No hyperdense vessel. Skull: No acute finding Sinuses/Orbits: Negative IMPRESSION: No acute finding. Advanced atrophy in keeping with history of Alzheimer's. Electronically Signed  By: Monte Fantasia M.D.   On: 02/07/2016 13:35   Dg Chest Port  1 View  Result Date: 02/07/2016 CLINICAL DATA:  Altered mental status EXAM: PORTABLE CHEST 1 VIEW COMPARISON:  12/01/2011 FINDINGS: Chronic cardiomegaly. Negative mediastinal contours. Interstitial coarsening greater at the left base where there is also volume loss, compatible with atelectasis. No air bronchogram, Kerley line, effusion, or pneumothorax. Osteopenia. Remote proximal left humerus fracture. IMPRESSION: 1. Mild atelectasis at the left base. 2. Chronic cardiomegaly.  No pulmonary edema. Electronically Signed   By: Monte Fantasia M.D.   On: 02/07/2016 07:25     Not all labs, radiology exams or other studies done during hospitalization come through on my EPIC note; however they are reviewed by me.    Assessment and Plan  [facility]HE[facility]ERS DEMENTIA, END STAGE SNF - admitted to SNF from [facility]; supportive care  AF SNF - not on anticoag 2/2 to falls and hx GI bleed; cont metoprolol 12.5 mg BID for rate  HTN SNF - conytrolled on metoprolol 25 mg BID  CKD3  COMPRESSION FX T11  GERD  MGUS (monoclonal gammopathy of unknown significance)   Time spent > 45 min;> 50% of time with patient was spent reviewing records, labs, tests and studies, counseling and developing plan of care  Webb Silversmith D. Sheppard Coil,  MD

## 2016-03-18 ENCOUNTER — Encounter: Payer: Self-pay | Admitting: Internal Medicine

## 2016-03-18 DIAGNOSIS — I10 Essential (primary) hypertension: Secondary | ICD-10-CM | POA: Insufficient documentation

## 2016-04-01 DEATH — deceased

## 2018-02-15 IMAGING — CT CT HEAD W/O CM
3 of 4 series · 14 of 47 positions shown, 16 images · non-contrast
Comparison: 07/30/2013

CLINICAL DATA: Acute encephalopathy.

EXAM:
CT HEAD WITHOUT CONTRAST
TECHNIQUE: Contiguous axial images were obtained from the base of the skull
through the vertex without intravenous contrast.

[Series 2: head w/o · axial · non-contrast · 0.46mm/px · z∈[-164,-44]mm · 8 of 30 slices shown, 10 images]
[im 3/30  brain]
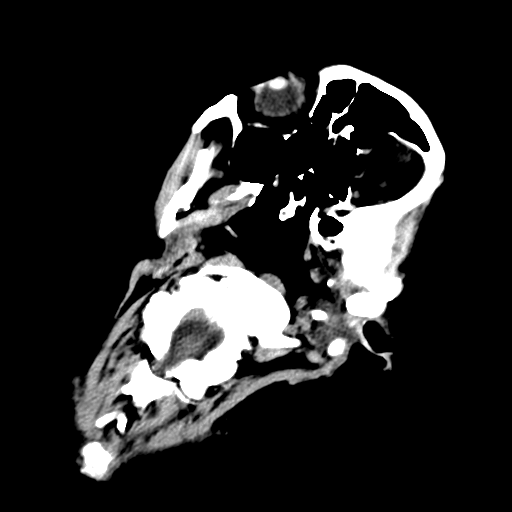
[im 3/30  bone]
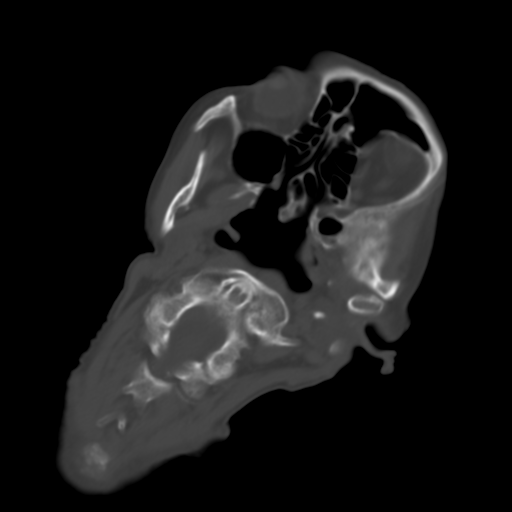
[im 7/30  brain]
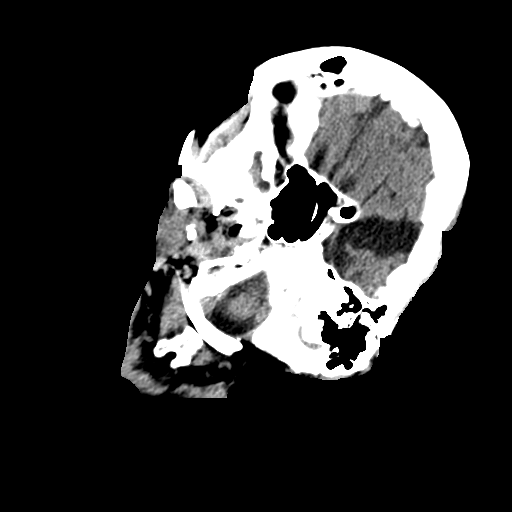
[im 11/30  brain]
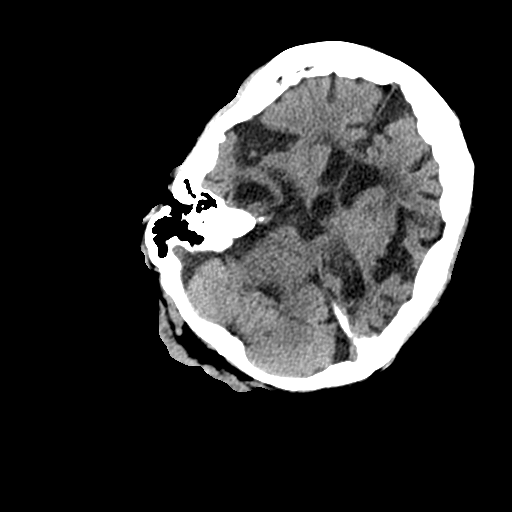
[im 13/30  brain]
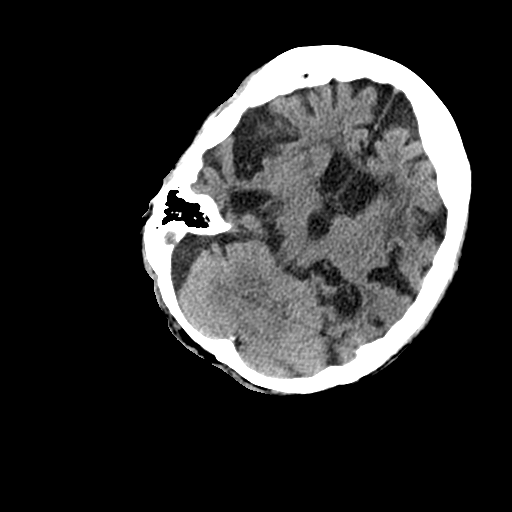
[im 17/30  brain]
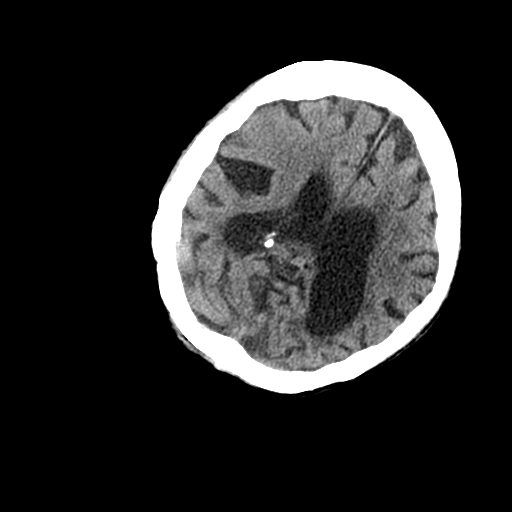
[im 17/30  bone]
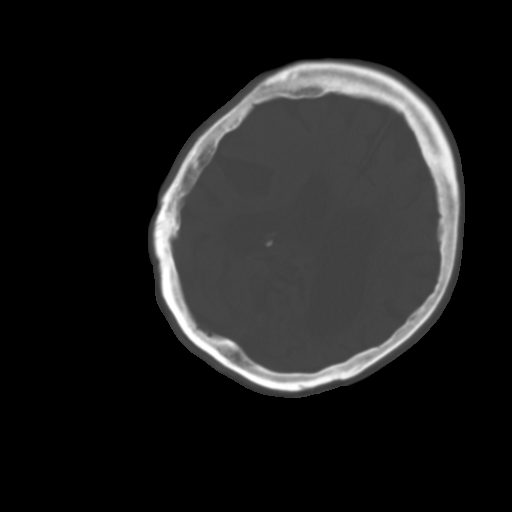
[im 19/30  brain]
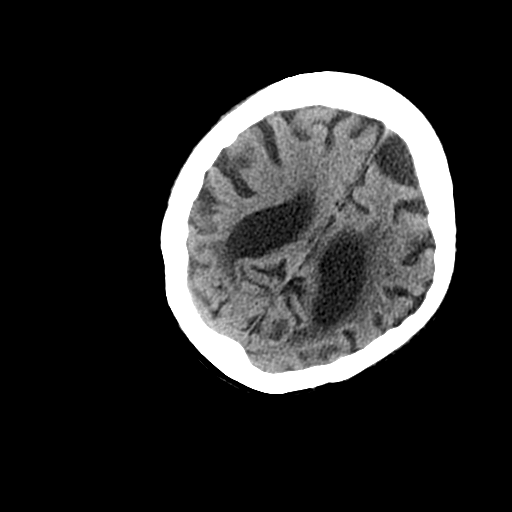
[im 23/30  brain]
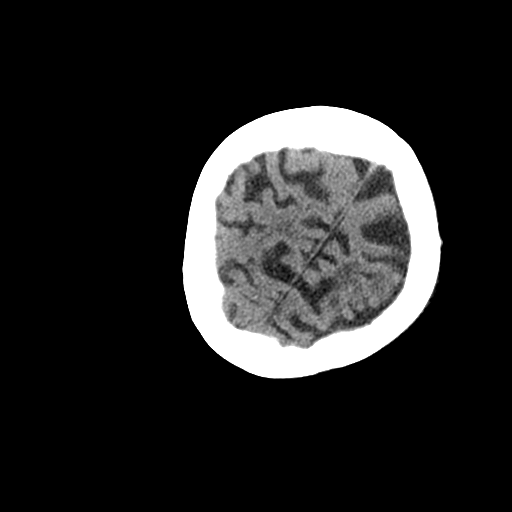
[im 27/30  brain]
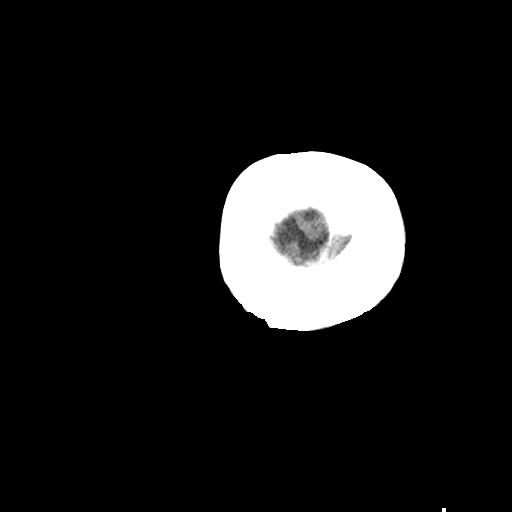

[Series 5: coronal · coronal · 0.29mm/px · 3 of 77 slices shown]
[im 26/77  brain]
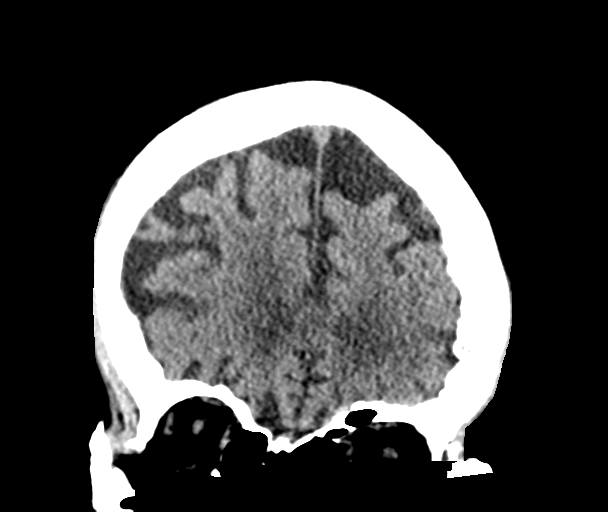
[im 34/77  brain]
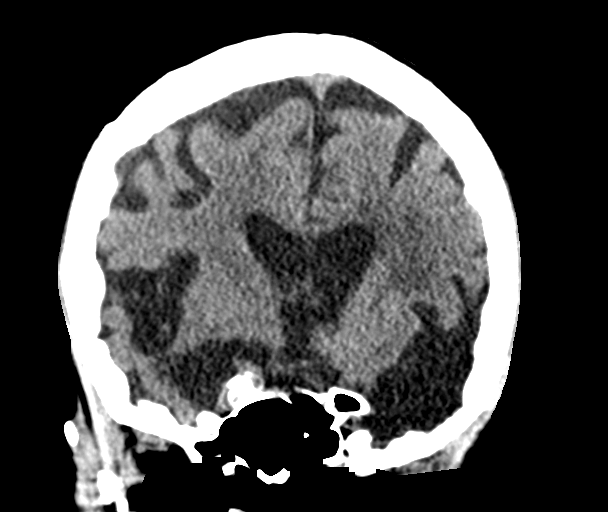
[im 43/77  brain]
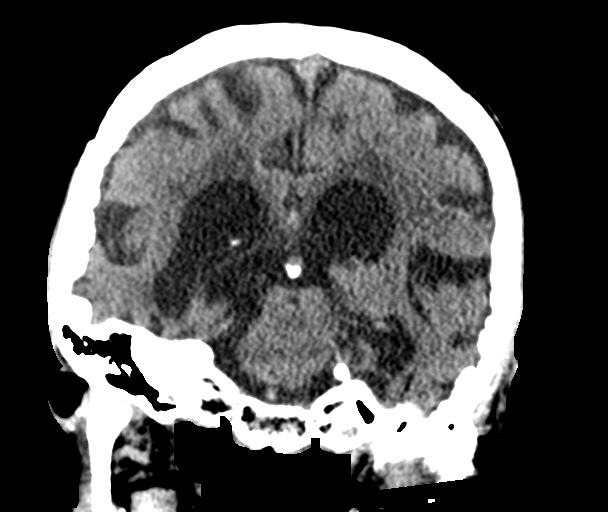

[Series 6: sagittal · sagittal · 0.29mm/px · 3 of 62 slices shown]
[im 23/62  brain]
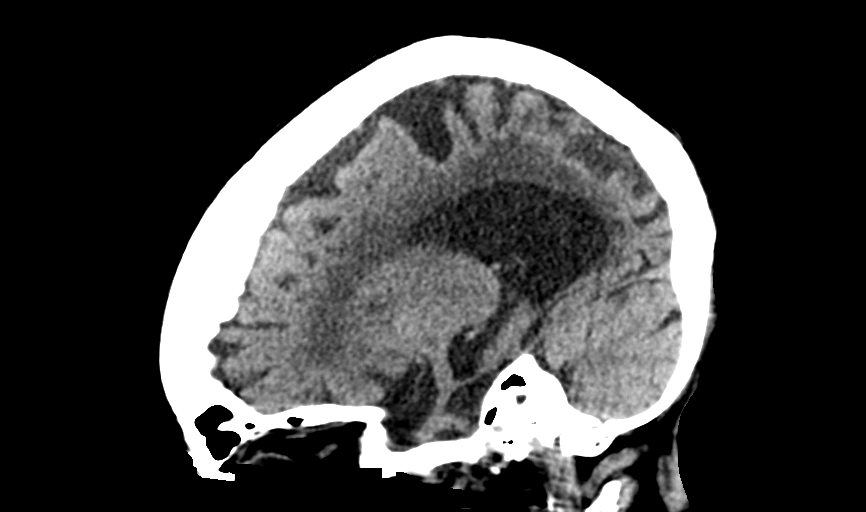
[im 31/62  brain]
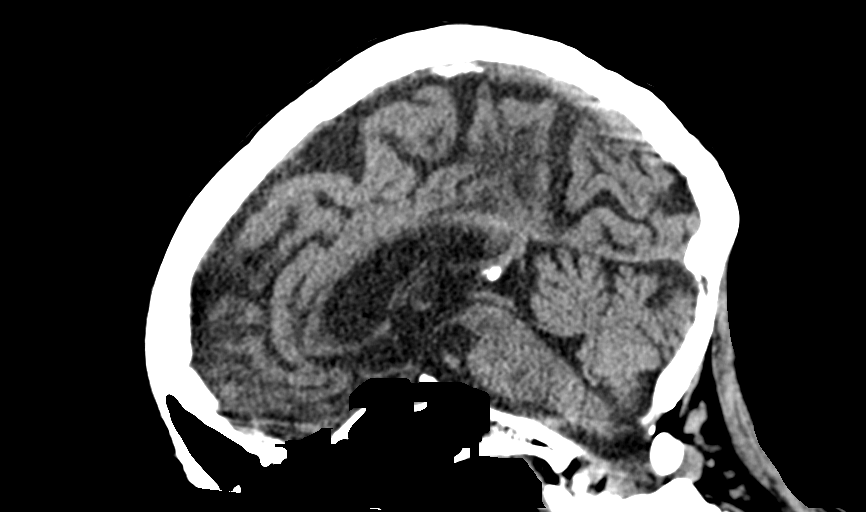
[im 39/62  brain]
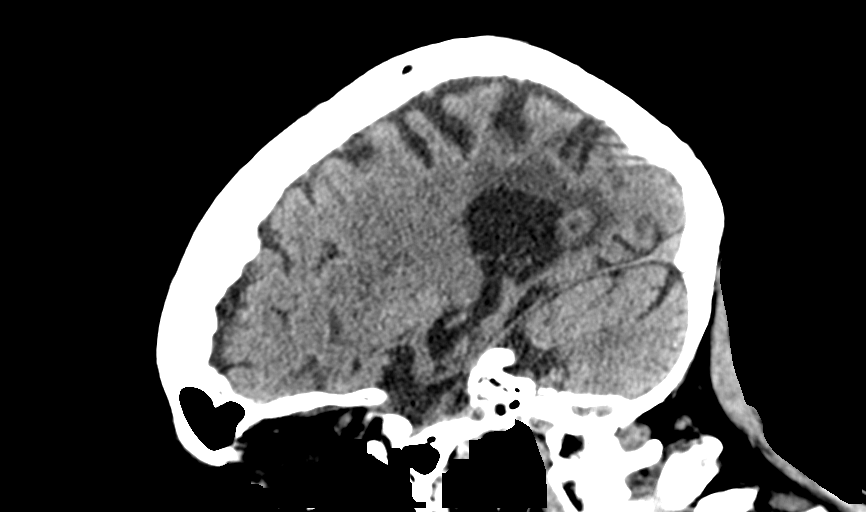

[14 of 47 positions shown; findings below may reference images not displayed]

FINDINGS: Brain: No acute infarct, hemorrhage, hydrocephalus, or mass. There
may be small hygromas about the cerebellum given the prominent
extra-axial CSF without folia widening. Cerebral atrophy is severe,
especially in the peri-insular and mesial temporal region,
correlating with history of Alzheimer's disease. Asymmetric greater
prior ischemic injury to the left cerebral hemisphere.

Vascular: Atherosclerotic calcification is mild for age. No
hyperdense vessel.

Skull: No acute finding

Sinuses/Orbits: Negative
IMPRESSION: No acute finding.

Advanced atrophy in keeping with history of Alzheimer's.

## 2018-02-17 IMAGING — DX DG CHEST 1V PORT
1 series · 1 of 1 positions shown · non-contrast
Comparison: 02/07/2016

CLINICAL DATA: Hypoxia

EXAM:
PORTABLE CHEST 1 VIEW

[chest ap]
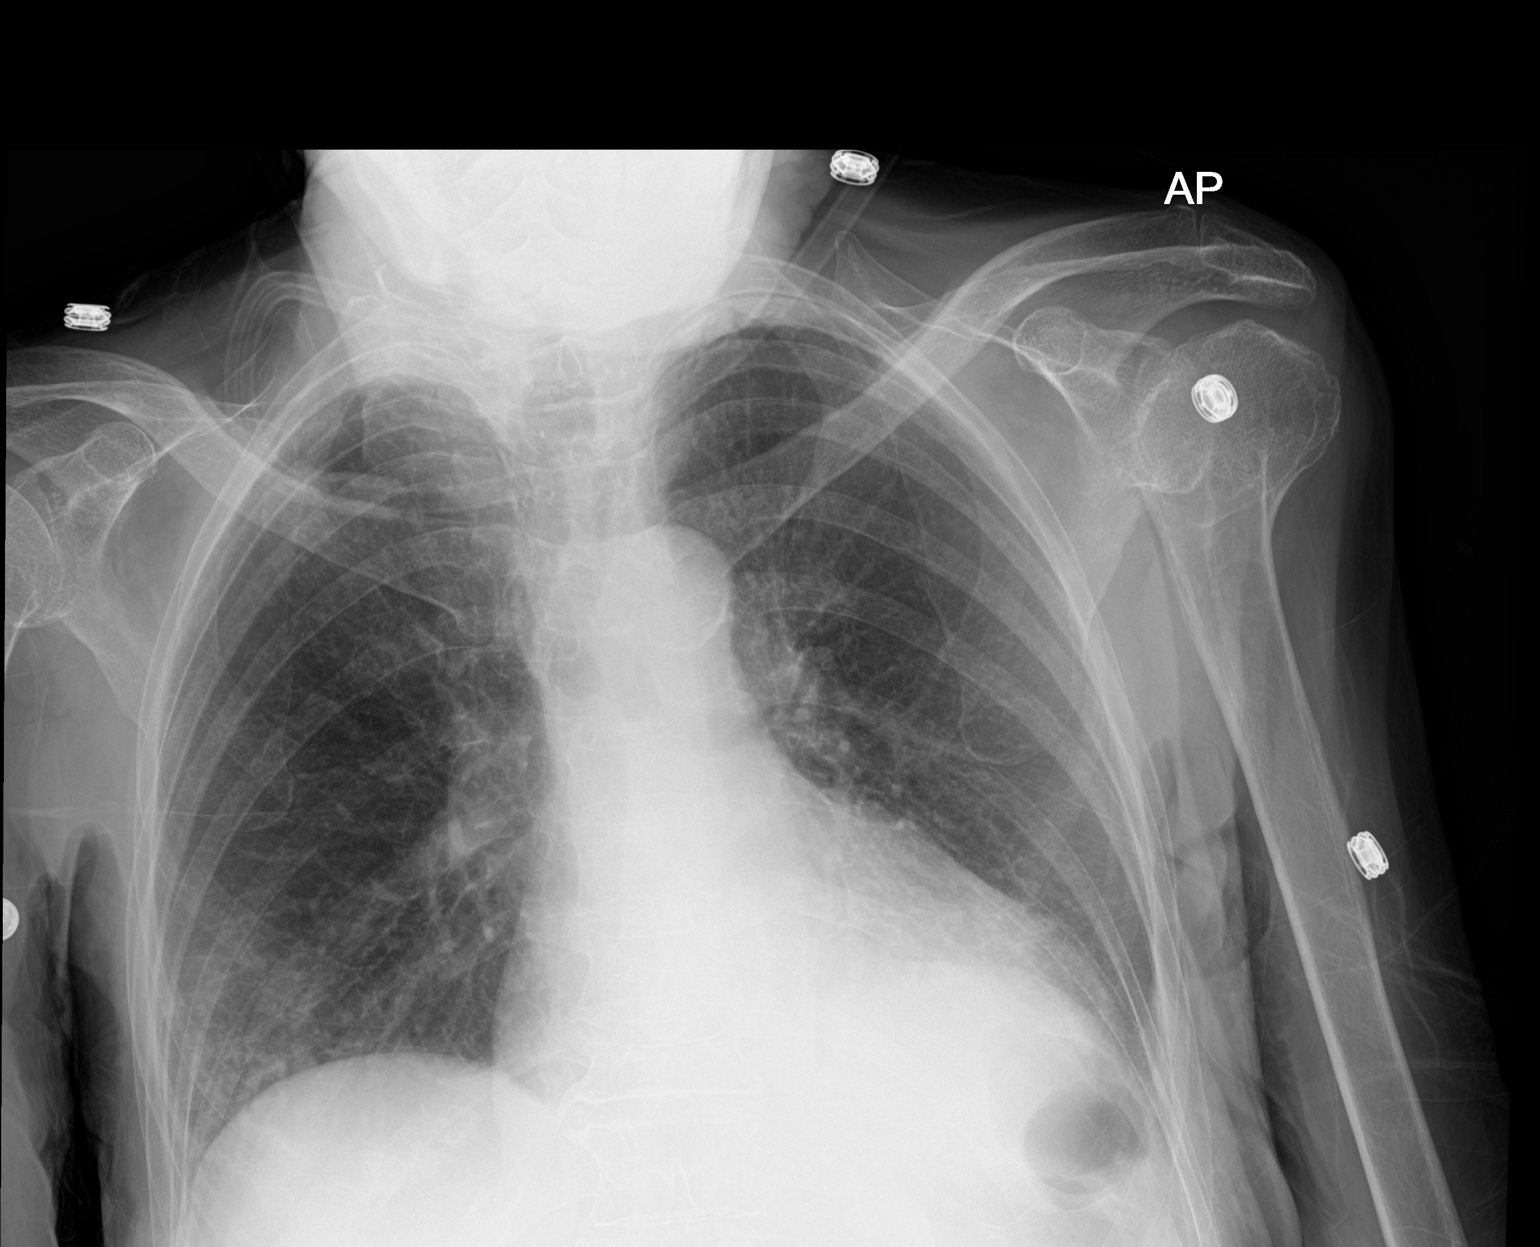

[1 of 1 positions shown; findings below may reference images not displayed]

FINDINGS: Mild cardiomegaly. Minimal bibasilar atelectasis. No effusions or
edema. No acute bony abnormality.
IMPRESSION: Mild cardiomegaly and minimal bibasilar atelectasis.
